# Patient Record
Sex: Female | Born: 1935 | Race: White | Hispanic: No | Marital: Single | Smoking: Former smoker
Health system: Southern US, Community
[De-identification: ages and names within clinical notes are randomized; demographics above are authoritative.]

## PROBLEM LIST (undated history)

## (undated) DIAGNOSIS — N183 Chronic kidney disease, stage 3 unspecified: Secondary | ICD-10-CM

## (undated) DIAGNOSIS — I739 Peripheral vascular disease, unspecified: Secondary | ICD-10-CM

## (undated) DIAGNOSIS — I2510 Atherosclerotic heart disease of native coronary artery without angina pectoris: Secondary | ICD-10-CM

## (undated) DIAGNOSIS — I10 Essential (primary) hypertension: Secondary | ICD-10-CM

## (undated) DIAGNOSIS — E119 Type 2 diabetes mellitus without complications: Secondary | ICD-10-CM

## (undated) DIAGNOSIS — I779 Disorder of arteries and arterioles, unspecified: Secondary | ICD-10-CM

## (undated) DIAGNOSIS — E782 Mixed hyperlipidemia: Secondary | ICD-10-CM

## (undated) DIAGNOSIS — M1990 Unspecified osteoarthritis, unspecified site: Secondary | ICD-10-CM

## (undated) HISTORY — DX: Peripheral vascular disease, unspecified: I73.9

## (undated) HISTORY — DX: Essential (primary) hypertension: I10

## (undated) HISTORY — DX: Chronic kidney disease, stage 3 unspecified: N18.30

## (undated) HISTORY — DX: Unspecified osteoarthritis, unspecified site: M19.90

## (undated) HISTORY — PX: CHOLECYSTECTOMY: SHX55

## (undated) HISTORY — PX: MASTECTOMY: SHX3

## (undated) HISTORY — PX: TONSILLECTOMY: SUR1361

## (undated) HISTORY — PX: PARTIAL HYSTERECTOMY: SHX80

## (undated) HISTORY — DX: Disorder of arteries and arterioles, unspecified: I77.9

## (undated) HISTORY — DX: Mixed hyperlipidemia: E78.2

## (undated) HISTORY — DX: Chronic kidney disease, stage 3 (moderate): N18.3

## (undated) HISTORY — PX: CATARACT EXTRACTION: SUR2

## (undated) HISTORY — DX: Atherosclerotic heart disease of native coronary artery without angina pectoris: I25.10

## (undated) HISTORY — PX: OTHER SURGICAL HISTORY: SHX169

## (undated) HISTORY — DX: Type 2 diabetes mellitus without complications: E11.9

---

## 2000-09-16 ENCOUNTER — Encounter: Payer: Self-pay | Admitting: Internal Medicine

## 2000-09-16 ENCOUNTER — Ambulatory Visit (HOSPITAL_COMMUNITY): Admission: RE | Admit: 2000-09-16 | Discharge: 2000-09-16 | Payer: Self-pay | Admitting: Internal Medicine

## 2001-09-29 ENCOUNTER — Encounter: Payer: Self-pay | Admitting: Internal Medicine

## 2001-09-29 ENCOUNTER — Ambulatory Visit (HOSPITAL_COMMUNITY): Admission: RE | Admit: 2001-09-29 | Discharge: 2001-09-29 | Payer: Self-pay | Admitting: Internal Medicine

## 2001-10-06 ENCOUNTER — Encounter: Payer: Self-pay | Admitting: Internal Medicine

## 2001-10-06 ENCOUNTER — Ambulatory Visit (HOSPITAL_COMMUNITY): Admission: RE | Admit: 2001-10-06 | Discharge: 2001-10-06 | Payer: Self-pay | Admitting: Internal Medicine

## 2001-10-30 ENCOUNTER — Ambulatory Visit (HOSPITAL_COMMUNITY): Admission: RE | Admit: 2001-10-30 | Discharge: 2001-10-30 | Payer: Self-pay | Admitting: Internal Medicine

## 2001-11-09 ENCOUNTER — Ambulatory Visit (HOSPITAL_COMMUNITY): Admission: RE | Admit: 2001-11-09 | Discharge: 2001-11-10 | Payer: Self-pay | Admitting: Pediatrics

## 2001-11-09 ENCOUNTER — Encounter: Payer: Self-pay | Admitting: *Deleted

## 2002-03-15 HISTORY — PX: CAROTID ENDARTERECTOMY: SUR193

## 2002-05-25 ENCOUNTER — Ambulatory Visit (HOSPITAL_COMMUNITY): Admission: RE | Admit: 2002-05-25 | Discharge: 2002-05-26 | Payer: Self-pay | Admitting: Cardiology

## 2002-09-03 ENCOUNTER — Encounter: Payer: Self-pay | Admitting: Internal Medicine

## 2002-09-03 ENCOUNTER — Ambulatory Visit (HOSPITAL_COMMUNITY): Admission: RE | Admit: 2002-09-03 | Discharge: 2002-09-03 | Payer: Self-pay | Admitting: Internal Medicine

## 2002-10-24 ENCOUNTER — Encounter (INDEPENDENT_AMBULATORY_CARE_PROVIDER_SITE_OTHER): Payer: Self-pay | Admitting: *Deleted

## 2002-10-24 ENCOUNTER — Inpatient Hospital Stay (HOSPITAL_COMMUNITY): Admission: RE | Admit: 2002-10-24 | Discharge: 2002-10-25 | Payer: Self-pay

## 2003-10-22 ENCOUNTER — Ambulatory Visit (HOSPITAL_COMMUNITY): Admission: RE | Admit: 2003-10-22 | Discharge: 2003-10-22 | Payer: Self-pay | Admitting: Ophthalmology

## 2003-11-26 ENCOUNTER — Ambulatory Visit (HOSPITAL_COMMUNITY): Admission: RE | Admit: 2003-11-26 | Discharge: 2003-11-26 | Payer: Self-pay | Admitting: Ophthalmology

## 2004-07-29 ENCOUNTER — Ambulatory Visit (HOSPITAL_COMMUNITY): Admission: RE | Admit: 2004-07-29 | Discharge: 2004-07-29 | Payer: Self-pay | Admitting: Internal Medicine

## 2005-08-20 ENCOUNTER — Ambulatory Visit (HOSPITAL_COMMUNITY): Admission: RE | Admit: 2005-08-20 | Discharge: 2005-08-20 | Payer: Self-pay | Admitting: Internal Medicine

## 2006-09-05 ENCOUNTER — Ambulatory Visit (HOSPITAL_COMMUNITY): Admission: RE | Admit: 2006-09-05 | Discharge: 2006-09-05 | Payer: Self-pay | Admitting: Internal Medicine

## 2007-09-06 ENCOUNTER — Ambulatory Visit (HOSPITAL_COMMUNITY): Admission: RE | Admit: 2007-09-06 | Discharge: 2007-09-06 | Payer: Self-pay | Admitting: Internal Medicine

## 2007-09-25 ENCOUNTER — Ambulatory Visit (HOSPITAL_COMMUNITY): Admission: RE | Admit: 2007-09-25 | Discharge: 2007-09-25 | Payer: Self-pay | Admitting: Internal Medicine

## 2008-10-07 ENCOUNTER — Ambulatory Visit (HOSPITAL_COMMUNITY): Admission: RE | Admit: 2008-10-07 | Discharge: 2008-10-07 | Payer: Self-pay | Admitting: Internal Medicine

## 2009-05-09 ENCOUNTER — Ambulatory Visit (HOSPITAL_COMMUNITY): Admission: RE | Admit: 2009-05-09 | Discharge: 2009-05-09 | Payer: Self-pay | Admitting: Internal Medicine

## 2009-08-20 ENCOUNTER — Ambulatory Visit (HOSPITAL_COMMUNITY): Admission: RE | Admit: 2009-08-20 | Discharge: 2009-08-20 | Payer: Self-pay | Admitting: Internal Medicine

## 2009-10-30 ENCOUNTER — Ambulatory Visit (HOSPITAL_COMMUNITY): Admission: RE | Admit: 2009-10-30 | Discharge: 2009-10-30 | Payer: Self-pay | Admitting: Internal Medicine

## 2010-04-06 ENCOUNTER — Encounter: Payer: Self-pay | Admitting: Internal Medicine

## 2010-07-31 NOTE — Discharge Summary (Signed)
NAME:  Marie House, Marie House                           ACCOUNT NO.:  [id number]   MEDICAL RECORD NO.:  [id number]                   PATIENT TYPE:  OIB   LOCATION:  [id number]                                 FACILITY:  [facility]   PHYSICIAN:  Roque Lias, M.D.               DATE OF BIRTH:  Jun 29, 1934   DATE OF ADMISSION:  11/09/2001  DATE OF DISCHARGE:  11/10/2001                                 DISCHARGE SUMMARY   ADMISSION DIAGNOSES:  1. Unstable angina.  2. Non-Insulin dependent diabetes mellitus.  3. Hypertension.  4. Hyperlipidemia.  5. Remote tobacco use.  6. History of breast cancer status-post left mastectomy two years ago.  7. Gastroesophageal reflux disease.  8. Positive family history of coronary artery disease.  9. Status-post nucleus stress test in 2001, showing septal inferior apical     ischemia.   DISCHARGE DIAGNOSES:  1. Unstable angina.  2. Non-Insulin dependent diabetes mellitus.  3. Hypertension.  4. Hyperlipidemia.  5. Remote tobacco use.  6. History of breast cancer status-post left mastectomy two years ago.  7. Gastroesophageal reflux disease.  8. Positive family history of coronary artery disease.  9. Status-post nucleus stress test in 2001 showing septal inferior apical     ischemia.  10.      Cardiac catheterization on 11/09/2001 with Dr. Adrian Prows.  11.      Patient was found to have a tandem 80%, 95% stenoses in the mid     left anterior descending.  He performed percutaneous transluminal     coronary angioplasty and stent with a Cypher stent with 0 residual     stenosis.  As well there is 10% to 20% right coronary artery stenosis.     Ejection fraction 60%.   HISTORY OF PRESENT ILLNESS:  The patient is a pleasant, 76 year old,  married, white female who has a past history of non-Insulin dependent  diabetes mellitus, hypertension, hyperlipidemia, who was referred to Dr.  Mathis Bud on 11/08/2001, for evaluation of chest pain.  She reported a two  week history  of mid substernal chest discomfort, which was associated with  shortness of breath, but no nausea or vomiting.  It was radiating to both  upper extremities.  One episode of chest discomfort was associated with  diaphoresis as well.  All episodes have been associated with extreme  weakness.  These chest pains were now occurring approximately 2 to 3 times a  day and were not necessarily exertional.  She had a previous stress test in  2001, which showed septal inferior apical ischemia.  At that time EF was  64%.  Cardiac risk factors were positive for hypertension, non-Insulin  dependent diabetes mellitus, hyperlipidemia, and positive family history for  CAD.  She had history of remote minimal tobacco use.  She denied  palpitations, dizziness, PND, orthopnea, or lower extremity edema.   On exam at that time, she was hemodynamically  stable with blood pressure  124/80, heart rate 72.  No significant abnormalities on physical  examination.  EKG showed sinus rhythm, nonspecific ST-T change.  At that  time, she was seen and evaluated by Odette Fraction in the office.  It was  felt that she needed to undergo definitive cardiac catheterization.  Risks  and benefits of the procedure were discussed in detail with the patient and  she was willing to proceed.  This was scheduled in for the following day  with Dr. Adrian Prows.  She was found to have tandem 80%, 95% stenoses in the  mid LAD.  He proceeded with PTCA and stent using a Cypher stent with 0  residual stenosis.  She tolerated the procedure well and had no  complications.  We plan to continue beta blocker, add ACE inhibitor.  Continue Aspirin and add Plavix 75 mg for three months.   On the morning of 11/10/2001, the patient is doing very well.  She is having  no chest pain and has no complaints.  Temperature is 98.1, pulse 75, blood  pressure 108/50, oxygen saturation rate 97% on room air.  Labs are stable  post-procedure.  She has maintained sinus  rhythm with no arrhythmia.  Right  groin site stable with no hematoma bleed.  At this time she is seen and  evaluated by Dr. Terance Ice who deems her stable for discharge home.   HOSPITAL CONSULTS:  None.   HOSPITAL PROCEDURES:  Cardiac catheterization on 11/09/2001 by Dr. Adrian Prows.  Please see his dictation, as it is not available to me.  Left main  normal.  Left circumflex and it's branches are normal.  LAD has mid 80%, 95%  tandem stenoses.  She has a small diagonal one which is normal.  Right  coronary has 10 % to 20% stenosis.  He then proceeded with PTCA stent using  a Cypher stent to the mid LAD.  This 80% and 95% tandem stenosis went to 0%  residual.  EF 60%.  She tolerated the procedure well and had no  complications.  She had normal renal arteries.   LABORATORIES POST-PROCEDURE:  Sodium 134, potassium 4.3, BUN 14, creatinine  0.7, glucose 234, which was treated.  White count 9.1, hemoglobin 10.9,  hematocrit 32.0, platelets 270.   Telemetry shows normal sinus rhythm.  No arrhythmias.   DISCHARGE MEDICATIONS:  1. Glucophage:  Do not take this on Friday or Saturday.  You can resume it     on Sunday at your previous dose.  2. Plavix 75 mg once a day for three months.  3. Enteric-coated Aspirin 325 mg once a day.  4. Amaryl 4 mg two tablets each day.  5. Metoprolol 50 mg twice a day.  6. Prevacid 30 mg once a day.  7. Vitamin E as before.  8. Xanax as needed.  9. Zetia 10 mg a day.  10.      Altace 5 mg once a day.  11.      Nitroglycerin 0.4 mg sublingual as directed for chest pain.  12.      Zocor 20 mg one each night.   DISCHARGE INSTRUCTIONS:  1. No strenuous activity, lifting more than 5 pounds, driving, or sexual     activity for three days.  2. Low salt, low fat, low cholesterol diabetic diet.  3.     May gently wash your wounds out with warm water and soap. 4. Call [phone] if any bleeding or any  signs of pain at wound site.  5. She has an appointment  to see Dr. Mathis Bud in the regional office     11/21/2001 at 10:20 a.m.         Mary B. Easley, P.A.-C.                   Roque Lias, M.D.    MBE/MEDQ  D:  11/10/2001  T:  11/13/2001  Job:  [id number]   cc:   Karma Greaser, M.D.

## 2010-07-31 NOTE — Cardiovascular Report (Signed)
NAME:  LATONDRA, MAGEL                           ACCOUNT NO.:  [id number]   MEDICAL RECORD NO.:  [id number]                   PATIENT TYPE:  OIB   LOCATION:  6525                                 FACILITY:  [facility]   PHYSICIAN:  Eden Lathe. Einar Gip, M.D.                  DATE OF BIRTH:  1934/10/19   DATE OF PROCEDURE:  11/09/2001  DATE OF DISCHARGE:  11/10/2001                              CARDIAC CATHETERIZATION   PROCEDURE PERFORMED:  1. Left ventriculography.  2. Selective right and left coronary arteriography.  3. Selective right and left renal arteriography.  4. Percutaneous transluminal coronary angioplasty and stenting of the mid     left anterior descending artery.   INDICATION:  The patient is a 76 year old female with a history of  hypertension, diabetes mellitus, hypercholesterolemia, who has been  complaining of chest pain very suggestive of angina pectoris class III.  Given this, she was brought to the cardiac catheterization laboratory to  evaluate her coronary anatomy. She also has multiple cardiac risk factors.  Renal arteriography was performed to evaluate for renal atherosclerotic  disease.   HEMODYNAMIC DATA:  The left ventricular pressures 151 with an end-diastolic  pressure of 16 mmHg. The aortic pressures were 150/80 with a mean of 105  mmHg.  There was no pressure gradient across the aortic valve.   ANGIOGRAPHIC DATA:   Right coronary artery:  The right coronary artery is a dominant vessel. It  is a small to moderate caliber vessel. It has mild disease in the proximal  segment constituting 10-20% stenosis.   Left main coronary artery:  The left main coronary artery is a large caliber  vessel. It is normal.   Circumflex coronary artery:  The circumflex coronary artery is a moderate  large caliber vessel. It gives origin to a very large obtuse marginal #1 and  continues as a obtuse marginal #2. It gives a small AV groove branch.  It  has mild disease.   Left  anterior descending artery:  The left anterior descending artery is a  large caliber vessel. The mid segment of the left anterior descending artery  has tandem 80% and a 95% stenosis.  The LAD wraps around the apex.   SELECTIVE RENAL ARTERIOGRAPHY:  Right and left renal arteriography revealed  normal renal arteries.   INTERVENTIONAL DATA:  Successful PTCA and stenting of the mid left anterior  descending artery with a 3.0 x 23 mm Cypher stent deployed at 16 atmospheres  of pressure. The stent was post-dilated with a 3.25 x 20 mm Quantum balloon  at 12 atmospheres of pressure given the 3.25 mm lumen. The overall stenosed  reduction was from 95% to 0% with TIMI-3 to TIMI-3 flow maintained. There  was no evidence of dissection, no evidence of thrombosis at the end of the  procedure.   RECOMMENDATIONS:  Continued risk factor modification is indicated. The  patient will be continued on Plavix for at least three months along with  aspirin.   TECHNIQUE OF PROCEDURE:  Under the usual sterile precautions, using a 6  French right femoral artery access, 6 [place] multipurpose B2 catheter was  advanced into the ascending aorta over a 0.035 inch J wire. The catheter was  gently advanced in the left ventricle and left ventricular pressures were  monitored. Hand contrast injection of the left ventricular was performed,  both in LAO and RAO projection. Then the catheter was flushed with saline  and pulled back into the ascending aorta and pressure gradient across  gradient across the aortic valve was monitored.  The left main coronary  artery was selectively engaged and angiography was performed. The right  coronary artery was selectively engaged and angiography was performed.  Then  the catheter was pulled back into the abdominal aorta and selective right  and left renal arteriography was performed.   TECHNIQUE OF INTERVENTION:  An 6 French sheath was exchanged for a 7 [place]  sheath.  Then a 7  [place] FL 3.5 guide was advanced into the ascending aorta  over the 0.035 inch J wire. The left main coronary artery was selectively  engaged and angiography was performed.  Then 180 cm x 0.014 inch ATW guide  wire was advanced into the left anterior descending artery and the lesion  length was measured with the help of the markers. Then a 2.0 x 20 mm  Maverick balloon was advanced into the left anterior descending artery and  after confirming the pressure of the balloon, balloon inflation was  performed at 14 atmospheres of pressure for 90 seconds. The balloon was  deflated, pulled back in the guiding catheter and angiography was performed.  There was a type A dissection noted in the balloon angioplastied site.  Again, the balloon was exchanged, pulled out of the body, Integrilin and  nitroglycerin was administered and angiography was performed. Because of the  persistence of the dissection, a 3.0 x 23 mm Cypher stent was advanced into  the left anterior descending artery and the stent was deployed at 16  atmospheres of pressure for 73 seconds.  Then the balloon was deflated,  pulled back in the guiding catheter, Integrilin and nitroglycerin was  administered and angiography was performed. The stent was post-dilated with  a 3.25 x 20 Quantum Maverick balloon at a maximum of 12 atmospheres of  pressure giving a 3.25 mm lumen.  Then the balloon was deflated, pulled back  in the catheter, Integrilin and nitroglycerin was administered and  angiography was performed. Excellent results were noted. Then the balloon  and the guide wire was pulled out of the coronary arteries and the guide  ___________ from the left main and pulled out of the body in the usual  fashion and arterial axis was sutured in place and patient was transferred  to recovery in a stable condition. During the procedure, intravenous Angiomax was administered and the ACT was maintained at therapeutic range.                                                     Eden Lathe. Einar Gip, M.D.    Minna Antis  D:  11/09/2001  T:  11/11/2001  Job:  [id number]   cc:   Sherrilee Gilles. Gerarda Fraction, M.D.  [address]  [place] [address]  Fax: [phone]   Roque Lias, M.D.

## 2010-07-31 NOTE — Cardiovascular Report (Signed)
NAME:  Marie House, Marie House                           ACCOUNT NO.:  [id number]   MEDICAL RECORD NO.:  [id number]                   PATIENT TYPE:  OIB   LOCATION:  2923                                 FACILITY:  [facility]   PHYSICIAN:  Eden Lathe. Einar Gip, M.D.                  DATE OF BIRTH:  [birth year]   DATE OF PROCEDURE:  05/25/2002  DATE OF DISCHARGE:  05/26/2002                              CARDIAC CATHETERIZATION   PROCEDURE PERFORMED:  1. Percutaneous transluminal coronary angioplasty and stenting of the left     anterior descending artery.  2. Intravascular intervention of the left anterior descending artery.  3. Intracoronary nitroglycerin administration.  4. Adjuvant Integrelin IV heparinization for anticoagulation.   CARDIOLOGIST:  Eden Lathe. Einar Gip, M.D.   INDICATIONS FOR PROCEDURE:  The patient is a 76 year old female with a  history of hypertension, diabetes, hyperlipidemia, who has a history of  noncompliance to her diet.  She has a history of dietary indiscretion.  She  was evaluated by Dr. Odette Fraction for recurrent angina pectoris.  She has  had a PTCA with Cypher stent implantation in August 2003 with a 3.0 x 23 mm  Cypher stent.  After obtaining informed consent, the patient was brought to  the cardiac catheterization lab to evaluate the coronary anatomy.  She  underwent a diagnostic cardiac catheterization at the [facility] earlier  this morning and because of very high grade stenosis in the proximal LAD and  ongoing chest pain, she was directly transferred to [facility]  cardiac catheterization lab for PCI of the left anterior descending coronary  artery.   EVALUATION DATA:  1. PTCA and stenting of the proximal left anterior descending artery with a     3.0 x 20 mm TAXUS drug-eluding stent deployed at 14 atmospheres of     pressure.  The stent between the struts was again dilated with the stent     balloon at 14 atmospheres of pressure.  Overall, the stenosis  reduced     from 99% to 0% with TIMI-2 to TIMI-3 flow maintained at the end of the     procedure.  No evidence of dissection or thrombus at the end of the     procedure.  2. IVUS confirmation of excellent opposition to the arterial wall.   RECOMMENDATIONS:  Risk factor modification is indicated.  I had an extensive  discussion with the patient regarding her dietary indiscretion and  compliance with her medical therapy.   DESCRIPTION OF PROCEDURE:  The 5 French right femoral artery access sheath  was exchanged for a 7 French sheath in an aseptic manner in a sterile  fashion.  Then a 7 [place] FL4 guide was advanced to the ascending aorta with  a 0.025 J-wire.  The left coronary artery was selectively engaged and  angiography was performed.  Then an 300 cm x 0.014 inch ATW  marker wire was  utilized to graft the left anterior descending stenosis.  The lesion was  then predilated with a 3.0 x 15 mm Maverick balloon at 10 atmospheres of  pressure.  The balloon was deflated, pulled back through the guiding  catheter and arteriography was performed.  There was type B dissection noted  in the entry of the previously placed stent.  IVUS was then performed after  withdrawing the balloon out.  The  IVUS catheter was used for IVUS  interrogation.  After performing the vein irrigation, the lesion length and  the __________ lumen diameter was measured.  Also, the previously placed  excellent opposition was confirmed.  Then the IVUS catheter was withdrawn  out of the body and a 2.0 x 20 TAXUS stent was advanced over the same  guidewire and after confirming the position of the placement of the stent,  the stent was deployed at 14 atmospheres pressure.  This was done for 60  seconds, and the balloon was deflated, pushed into the previously placed  Cypher stent and a second inflation at 14 atmospheres pressure for 20  seconds was performed.  The balloon was deflated and pulled back in the  guiding  catheter.  Intracoronary nitroglycerin was administered and  angiography was performed.  Excellent results were noted.  After confirming  the success, the guidewire was withdrawn.  Angiography was performed.  The  guide catheter was distended and pulled out of the body in the usual  fashion.  The arterial access sheath was sutured in place, and the patient  was transferred to recovery in stable condition.  The patient tolerated the  procedure well.                                               Eden Lathe. Einar Gip, M.D.    Minna Antis  D:  05/25/2002  T:  05/26/2002  Job:  [id number]   cc:   Sherrilee Gilles. Gerarda Fraction, M.D.  [address] [place] [address]  Fax: [place]

## 2010-07-31 NOTE — Op Note (Signed)
NAME:  Marie House, Marie House                           ACCOUNT NO.:  [id number]   MEDICAL RECORD NO.:  [id number]                   PATIENT TYPE:  AMB   LOCATION:  DAY                                  FACILITY:  [facility]   PHYSICIAN:  Cristopher Estimable. Rourk, M.D.               DATE OF BIRTH:  1934-06-30   DATE OF PROCEDURE:  DATE OF DISCHARGE:                                 OPERATIVE REPORT   PROCEDURE:  Diagnostic esophagogastroduodenoscopy followed by diagnostic  colonoscopy.   INDICATIONS FOR PROCEDURE:  The patient is a 76 year old lady with right-  sided chest and back pain.  She was found to be Hemoccult positive in my  office.  An esophagogastroduodenoscopy and colonoscopy are now being done to  further evaluate symptoms.  The procedure has been discussed with the  patient previously, and again today at the bedside.  Potential risks,  benefits, and alternatives have been reviewed and questions and answered.  She is agreeable with this.  Conscious sedation will be Versed and Demerol.   PROCEDURE NOTE:  The O2 saturation, blood pressure, pulse, and respirations  were monitored throughout the entire procedure.  Conscious sedation:  Versed  4 mg IV, Demerol 75 mg IV, in divided doses.  Instrument:  Olympus  videogastroscope and colonoscope.   EGD FINDINGS:  Examination of the tubular esophagus revealed a noncritical  Schatzki's ring.  Otherwise, esophageal mucosa appeared normal.  The EG  junction was easily traversed easily into the stomach.  The gastric cavity  was entered and insufflated well with air.  Thorough examination of the  gastric mucosa including retroflexed view of the proximal stomach and  esophagogastric junction demonstrated only a small hiatal hernia and some  erosions.  The gastric mucosa otherwise appeared normal.  Pylorus was patent  and easily traversed.  Examination of the duodenum, bulb, and second portion  appeared normal.   THERAPEUTIC/DIAGNOSTIC MANEUVERS PERFORMED:   None.   The patient tolerated the procedure well and was prepared for colonoscopy.  Digital rectal examination revealed no abnormalities.  Endoscopic findings:  The prep was good.  Rectum:  Examination of the rectal mucosa including  retroflexed view at the anal verge revealed no abnormalities except for  internal hemorrhoids.  The colonic mucosa was surveyed from the rectosigmoid  junction through the left transverse and right colon to the area of the  appendiceal orifice, ileocecal valve, and cecum.  These structures were well  seen and photographed for the record.  The scope was slowly withdrawn was  slowly withdrawn.  All mucosal surfaces were again seen.  No other  abnormalities aside from scattered left-sided diverticula were seen.  The  patient tolerated the procedure well and was reacted in endoscopy.   IMPRESSION:  1. Noncritical  Schatzki's ring, not needing inflated.  The patient was not     having dysphagia.  2. Linear antral erosions.  3. The remainder  of the gastric mucosa and duodenum through the second     portion appeared normal.   COLONOSCOPY FINDINGS:  1. Internal hemorrhoids, otherwise normal rectum  2. Left-sided diverticula, and the colonic mucosa appeared normal.   RECOMMENDATIONS:  Lab work is pending through my office.  She will need to  be checked for Helicobacter pylori.  Would consider a trial of nonsteroidal  anti-inflammatory drug therapy for musculoskeletal pain.  Further  recommendations to follow.                                                 Cristopher Estimable. Rourk, M.D.    RMR/MEDQ  D:  10/30/2001  T:  10/31/2001  Job:  [id number]   cc:   Virgel Paling, M.D.  [address], [place] [address]  Fax: [phone]

## 2010-07-31 NOTE — Op Note (Signed)
NAME:  Marie House, Marie House                           ACCOUNT NO.:  [id number]   MEDICAL RECORD NO.:  [id number]                   PATIENT TYPE:  INP   LOCATION:  2550                                 FACILITY:  [facility]   PHYSICIAN:  Larrie Kass., M.D.            DATE OF BIRTH:  02-05-1935   DATE OF PROCEDURE:  10/24/2002  DATE OF DISCHARGE:                                 OPERATIVE REPORT   PREOPERATIVE DIAGNOSIS:  Right carotid stenosis.   POSTOPERATIVE DIAGNOSIS:  Right carotid stenosis.   OPERATION PERFORMED:  Right carotid endarterectomy with patch angioplasty.   SURGEON:  Georgina Quint, M.D.   ASSISTANT:  Darrelyn Hillock, M.D.   ANESTHESIA:  General.   DESCRIPTION OF PROCEDURE:  Before coming into the operating room the patient  had arterial line placed.  After adequate monitoring and induction of  anesthesia and routine preparation and draping of the right side of the  neck, I made an incision parallel to the anterior border of the  sternocleidomastoid muscle in the mid neck about 6 cm long and stopping well  short of the angle of the jaw.  I dissected down through the subcutaneous  tissues and noted the tail of the parotid gland and avoided that and then  dissected on down toward the carotid pulse going through platysma and the  deep cervical fascia.  I noted the internal jugular vein and a large vein  entering it from anterior but lower down than usual for the common facial  vein and I tried not to injure it.  As I dissected, I did make a sizable  hole in the side of that vein near the entry into the jugular and I sewed  that closed with running 4-0 Prolene suture.  I then divided a couple of  smaller veins and had good access to the common carotid.  I dissected up to  the carotid bifurcation.  The hypoglossal nerve lay lower than usual and I  had to dissect it up for some distance in order to free it up and get  adequate exposure of the carotid bifurcation.   There was a typical disease  at the carotid bifurcation and in the proximal internal carotid and it  softened up nicely as I dissected distally on the internal carotid artery.  I controlled the common carotid, the internal carotid, the external carotid,  and superior thyroid arteries with loops.  After the patient was  heparinized, I tightened the loops and placed the clamp on the common  carotid, then made a longitudinal arteriotomy beginning on the common  carotid and going up into the internal carotid past the plaque.  The plaque  was partially degenerated and had a web-like stenosis in the proximal part.  It seemed to thin out nicely as it went on up into the internal carotid.  I  placed a 10 [place] straight shunt between the common  carotid artery and the  internal carotid artery, restored arterial flow to the brain quickly.  I  then proceeded leisurely to perform an endarterectomy beginning in the  common carotid and working up into the internal and then intussuscepting  plaque out of the external carotid.  I then got good back bleeding from the  external carotid and filled it with heparinized saline.  There was also good  back bleeding from the internal carotid.  I very carefully divided the  plaque transversely as it thinned into normal intima going distally into the  internal carotid.  I vigorously irrigated it with saline with heparin and  saw no tendency for any dissection.  I then closed the arteriotomy after  removing small areas of loose smooth muscle and plaque, then irrigating it  thoroughly.  I closed it with a Hemashield Finesse patch sewn in with 6-0  Prolene.  A few flutes were present requiring  additional sutures and then  hemostasis was good.  There was a very slight tendency towards oozing  attributable to patient being on anticoagulant drugs.  I then reversed  heparin  with 75 mg of protamine.  I left a Penrose drain at the lower end of the  incision and closed the  neck wound with two layers of running 3-0 Vicryl and  closed the skin with staples after getting good hemostasis in the  subcutaneous tissues.  The patient was stable through the operation.                                                Larrie Kass., M.D.    WB/MEDQ  D:  10/24/2002  T:  10/24/2002  Job:  [id number]   cc:   Quay Burow, M.D.  [address] [place] [address]  Fax: [place] Gerarda Fraction, M.D.  [address] [place] [address]  Fax: [phone]

## 2010-11-12 ENCOUNTER — Other Ambulatory Visit (HOSPITAL_COMMUNITY): Payer: Self-pay | Admitting: Internal Medicine

## 2010-11-12 DIAGNOSIS — Z139 Encounter for screening, unspecified: Secondary | ICD-10-CM

## 2010-11-19 ENCOUNTER — Ambulatory Visit (HOSPITAL_COMMUNITY)
Admission: RE | Admit: 2010-11-19 | Discharge: 2010-11-19 | Disposition: A | Discharge status: Home / Self Care | Payer: Medicare HMO | Source: Ambulatory Visit | Attending: Internal Medicine | Admitting: Internal Medicine

## 2010-11-19 DIAGNOSIS — Z139 Encounter for screening, unspecified: Secondary | ICD-10-CM

## 2010-11-19 DIAGNOSIS — Z1231 Encounter for screening mammogram for malignant neoplasm of breast: Secondary | ICD-10-CM | POA: Insufficient documentation

## 2012-12-28 ENCOUNTER — Other Ambulatory Visit (HOSPITAL_COMMUNITY): Payer: Self-pay | Admitting: Internal Medicine

## 2012-12-28 DIAGNOSIS — Z139 Encounter for screening, unspecified: Secondary | ICD-10-CM

## 2013-01-04 ENCOUNTER — Ambulatory Visit (HOSPITAL_COMMUNITY): Payer: Medicare HMO

## 2013-06-06 ENCOUNTER — Encounter: Payer: Self-pay | Admitting: *Deleted

## 2013-06-06 DIAGNOSIS — N183 Chronic kidney disease, stage 3 unspecified: Secondary | ICD-10-CM | POA: Insufficient documentation

## 2013-06-06 DIAGNOSIS — E782 Mixed hyperlipidemia: Secondary | ICD-10-CM | POA: Insufficient documentation

## 2013-06-06 DIAGNOSIS — G4700 Insomnia, unspecified: Secondary | ICD-10-CM | POA: Insufficient documentation

## 2013-06-06 DIAGNOSIS — I10 Essential (primary) hypertension: Secondary | ICD-10-CM | POA: Insufficient documentation

## 2013-06-06 DIAGNOSIS — E785 Hyperlipidemia, unspecified: Secondary | ICD-10-CM

## 2013-06-06 DIAGNOSIS — E119 Type 2 diabetes mellitus without complications: Secondary | ICD-10-CM

## 2013-06-06 DIAGNOSIS — N181 Chronic kidney disease, stage 1: Secondary | ICD-10-CM

## 2013-06-07 ENCOUNTER — Encounter: Payer: Self-pay | Admitting: Cardiology

## 2013-06-07 ENCOUNTER — Ambulatory Visit (INDEPENDENT_AMBULATORY_CARE_PROVIDER_SITE_OTHER): Payer: Medicare HMO | Admitting: Cardiology

## 2013-06-07 VITALS — BP 149/59 | HR 71 | Ht 65.0 in | Wt 207.0 lb

## 2013-06-07 DIAGNOSIS — N183 Chronic kidney disease, stage 3 unspecified: Secondary | ICD-10-CM

## 2013-06-07 DIAGNOSIS — I6529 Occlusion and stenosis of unspecified carotid artery: Secondary | ICD-10-CM | POA: Insufficient documentation

## 2013-06-07 DIAGNOSIS — E119 Type 2 diabetes mellitus without complications: Secondary | ICD-10-CM

## 2013-06-07 DIAGNOSIS — E782 Mixed hyperlipidemia: Secondary | ICD-10-CM

## 2013-06-07 DIAGNOSIS — I10 Essential (primary) hypertension: Secondary | ICD-10-CM

## 2013-06-07 DIAGNOSIS — I2510 Atherosclerotic heart disease of native coronary artery without angina pectoris: Secondary | ICD-10-CM | POA: Insufficient documentation

## 2013-06-07 NOTE — Assessment & Plan Note (Signed)
No obvious angina symptoms but chronic shortness of breath. ECG is normal today. She is on reasonable medical regimen. No assessment of LVEF in several years. We will plan at least a baseline echocardiogram for now and clinical followup in 6 months.

## 2013-06-07 NOTE — Assessment & Plan Note (Signed)
Blood pressure elevated today. She is on good medical regimen including Coreg and Vasotec. Stressed sodium restriction, activity as tolerated. Keep follow with Dr. Willey Blade.

## 2013-06-07 NOTE — Assessment & Plan Note (Signed)
Fairly recent creatinine 1.8.

## 2013-06-07 NOTE — Assessment & Plan Note (Signed)
Reports compliance Pravachol, LDL 79.

## 2013-06-07 NOTE — Progress Notes (Signed)
Clinical Summary Marie House is a 79 y.o.female referred to establish cardiology followup by Dr. Willey Blade. She is a former patient of Dr. Gwenlyn House with [facility] last seen in January 2013. I reviewed her history. She reports no angina symptoms, has had chronic shortness of breath. States that she is functionally limited by significant bilateral knee arthritis. She used to walk a mile a day for exercise, but has not been able to do this anymore. She reports compliance with her medical therapy, no obvious problems.  Lab work from October 2014 showed potassium 5.1, BUN 20, creatinine 1.8, hemoglobin A1c 7.3. Lipid numbers from June 2014 showed cholesterol 181, triglycerides 22, HDL 62, LDL 79.  She reportedly had a Myoview study in January 2010 that showed no clear evidence of ischemia. Carotid Dopplers from February 2011 revealed minimal carotid atherosclerosis bilaterally. No followup testing since that time.  ECG today shows normal sinus rhythm.   Allergies  Allergen Reactions  . Altace [Ramipril]   . Atacand [Candesartan]   . Avandia [Rosiglitazone]   . Crestor [Rosuvastatin]   . Diovan [Valsartan]   . Lisinopril   . Lotensin [Benazepril]   . Norvasc [Amlodipine Besylate]   . Zetia [Ezetimibe]     Current Outpatient Prescriptions  Medication Sig Dispense Refill  . ALPRAZolam (XANAX) 0.5 MG tablet Take 0.5 mg by mouth 3 (three) times daily as needed for anxiety.      Marland Kitchen aspirin EC 81 MG tablet Take 81 mg by mouth daily.      . carvedilol (COREG) 12.5 MG tablet Take 12.5 mg by mouth 2 (two) times daily with a meal.       . enalapril (VASOTEC) 2.5 MG tablet Take 2.5 mg by mouth daily.      . fenofibrate (TRICOR) 145 MG tablet Take 145 mg by mouth daily.      . hydrochlorothiazide (HYDRODIURIL) 25 MG tablet Take 25 mg by mouth daily.      . insulin aspart (NOVOLOG) 100 UNIT/ML injection Inject 10 Units into the skin 3 (three) times daily before meals.      . insulin glargine (LANTUS) 100 UNIT/ML  injection Inject 80 Units into the skin daily.      Marland Kitchen omeprazole (PRILOSEC) 20 MG capsule Take 20 mg by mouth daily.      . pravastatin (PRAVACHOL) 80 MG tablet Take 80 mg by mouth daily.      . traMADol (ULTRAM) 50 MG tablet Take by mouth every 6 (six) hours as needed.       No current facility-administered medications for this visit.    Past Medical History  Diagnosis Date  . Coronary atherosclerosis of native coronary artery     Cypher DES LAD 2003, Taxus DES LAD 2004  . Carotid artery disease   . Essential hypertension, benign   . Mixed hyperlipidemia   . Type 2 diabetes mellitus   . Arthritis   . CKD (chronic kidney disease) stage 3, GFR 30-59 ml/min     Past Surgical History  Procedure Laterality Date  . Tonsillectomy    . Mastectomy Left   . Cholecystectomy    . Partial hysterectomy    . Cataract extraction    . Carotid endarterectomy Right 2004    Family History  Problem Relation Age of Onset  . CAD Mother     Social History Marie House reports that she has quit smoking. Her smoking use included Cigarettes. She smoked 0.00 packs per day. She does not have any  smokeless tobacco history on file. Marie House reports that she does not drink alcohol.  Review of Systems Negative except as outlined above.  Physical Examination Filed Vitals:   06/07/13 0856  BP: 149/59  Pulse: 71   Filed Weights   06/07/13 0856  Weight: 207 lb (93.895 kg)   Overweight woman, appears comfortable at rest. HEENT: Conjunctiva and lids normal, oropharynx clear. Neck: Supple, no elevated JVP or carotid bruits, no thyromegaly. Lungs: Clear to auscultation, nonlabored breathing at rest. Cardiac: Regular rate and rhythm, no S3, soft systolic murmur, no pericardial rub. Abdomen: Soft, nontender, bowel sounds present, no guarding or rebound. Extremities: No pitting edema, distal pulses 2+. Skin: Warm and dry. Musculoskeletal: No kyphosis. Neuropsychiatric: Alert and oriented x3, affect  grossly appropriate.   Problem List and Plan   Coronary atherosclerosis of native coronary artery No obvious angina symptoms but chronic shortness of breath. ECG is normal today. She is on reasonable medical regimen. No assessment of LVEF in several years. We will plan at least a baseline echocardiogram for now and clinical followup in 6 months.  Essential hypertension, benign Blood pressure elevated today. She is on good medical regimen including Coreg and Vasotec. Stressed sodium restriction, activity as tolerated. Keep follow with Dr. Willey Blade.  Mixed hyperlipidemia Reports compliance Pravachol, LDL 79.  Carotid artery occlusion History of right CEA in 2004. Followup carotid Dopplers to be obtained.  CKD (chronic kidney disease) stage 3, GFR 30-59 ml/min Fairly recent creatinine 1.8.  Type 2 diabetes mellitus On insulin, followed by Dr. Willey Blade.    Satira Sark, M.D., F.A.C.C.

## 2013-06-07 NOTE — Assessment & Plan Note (Signed)
On insulin, followed by Dr. Willey Blade.

## 2013-06-07 NOTE — Assessment & Plan Note (Signed)
History of right CEA in 2004. Followup carotid Dopplers to be obtained.

## 2013-06-07 NOTE — Patient Instructions (Signed)
Your physician wants you to follow-up in: 6 months You will receive a reminder letter in the mail two months in advance. If you don't receive a letter, please call our office to schedule the follow-up appointment.    Your physician recommends that you continue on your current medications as directed. Please refer to the Current Medication list given to you today.   Your physician has requested that you have an echocardiogram. Echocardiography is a painless test that uses sound waves to create images of your heart. It provides your doctor with information about the size and shape of your heart and how well your heart's chambers and valves are working. This procedure takes approximately one hour. There are no restrictions for this procedure.  Your physician has requested that you have a carotid duplex. This test is an ultrasound of the carotid arteries in your neck. It looks at blood flow through these arteries that supply the brain with blood. Allow one hour for this exam. There are no restrictions or special instructions.   Thank you for choosing [facility] !

## 2013-06-12 ENCOUNTER — Ambulatory Visit (HOSPITAL_COMMUNITY)
Admission: RE | Admit: 2013-06-12 | Discharge: 2013-06-12 | Disposition: A | Discharge status: Home / Self Care | Payer: Medicare HMO | Source: Ambulatory Visit | Attending: Cardiology | Admitting: Cardiology

## 2013-06-12 DIAGNOSIS — I6529 Occlusion and stenosis of unspecified carotid artery: Secondary | ICD-10-CM | POA: Insufficient documentation

## 2013-06-12 DIAGNOSIS — I2510 Atherosclerotic heart disease of native coronary artery without angina pectoris: Secondary | ICD-10-CM | POA: Insufficient documentation

## 2013-06-12 DIAGNOSIS — Z6834 Body mass index (BMI) 34.0-34.9, adult: Secondary | ICD-10-CM | POA: Insufficient documentation

## 2013-06-12 DIAGNOSIS — Z87891 Personal history of nicotine dependence: Secondary | ICD-10-CM | POA: Insufficient documentation

## 2013-06-12 DIAGNOSIS — I519 Heart disease, unspecified: Secondary | ICD-10-CM | POA: Diagnosis not present

## 2013-06-12 DIAGNOSIS — E785 Hyperlipidemia, unspecified: Secondary | ICD-10-CM | POA: Insufficient documentation

## 2013-06-12 DIAGNOSIS — I129 Hypertensive chronic kidney disease with stage 1 through stage 4 chronic kidney disease, or unspecified chronic kidney disease: Secondary | ICD-10-CM | POA: Insufficient documentation

## 2013-06-12 DIAGNOSIS — N183 Chronic kidney disease, stage 3 unspecified: Secondary | ICD-10-CM | POA: Insufficient documentation

## 2013-06-12 DIAGNOSIS — E119 Type 2 diabetes mellitus without complications: Secondary | ICD-10-CM | POA: Insufficient documentation

## 2013-06-12 NOTE — Progress Notes (Signed)
*  PRELIMINARY RESULTS* Echocardiogram 2D Echocardiogram has been performed.  [place], [place] 06/12/2013, 3:47 PM

## 2013-11-27 ENCOUNTER — Encounter: Payer: Self-pay | Admitting: Cardiology

## 2013-11-27 ENCOUNTER — Ambulatory Visit (INDEPENDENT_AMBULATORY_CARE_PROVIDER_SITE_OTHER): Payer: Commercial Managed Care - HMO | Admitting: Cardiology

## 2013-11-27 VITALS — BP 128/76 | HR 89 | Ht 65.0 in | Wt 206.0 lb

## 2013-11-27 DIAGNOSIS — I10 Essential (primary) hypertension: Secondary | ICD-10-CM | POA: Diagnosis not present

## 2013-11-27 DIAGNOSIS — I658 Occlusion and stenosis of other precerebral arteries: Secondary | ICD-10-CM

## 2013-11-27 DIAGNOSIS — I6529 Occlusion and stenosis of unspecified carotid artery: Secondary | ICD-10-CM | POA: Diagnosis not present

## 2013-11-27 DIAGNOSIS — E782 Mixed hyperlipidemia: Secondary | ICD-10-CM | POA: Diagnosis not present

## 2013-11-27 DIAGNOSIS — I6523 Occlusion and stenosis of bilateral carotid arteries: Secondary | ICD-10-CM

## 2013-11-27 DIAGNOSIS — I2510 Atherosclerotic heart disease of native coronary artery without angina pectoris: Secondary | ICD-10-CM | POA: Diagnosis not present

## 2013-11-27 NOTE — Assessment & Plan Note (Signed)
Continue Pravachol, keep followup Dr. Willey Blade for regular lab work.

## 2013-11-27 NOTE — Progress Notes (Signed)
Clinical Summary Marie House is a 79 y.o.female last seen in March. She tells me that her husband died last month, she is still going through the grieving process. She does have children that still live in town. No change in cardiac status, she is not reporting any angina, states that she has been taking her medicines regularly.  Echocardiogram from March of this year reported LVEF 65-70% with mild LVOT gradient, grade 1 diastolic dysfunction, mildly sclerotic aortic valve, mitral annular calcification. Carotid Dopplers in March of this year reported less than 50% bilateral ICA stenoses.  She reportedly had a Myoview study in January 2010 that showed no clear evidence of ischemia. We continue to follow her on medical therapy, no change in symptoms.   Allergies  Allergen Reactions  . Altace [Ramipril]   . Atacand [Candesartan]   . Avandia [Rosiglitazone]   . Crestor [Rosuvastatin]   . Diovan [Valsartan]   . Lisinopril   . Lotensin [Benazepril]   . Norvasc [Amlodipine Besylate]   . Zetia [Ezetimibe]     Current Outpatient Prescriptions  Medication Sig Dispense Refill  . ALPRAZolam (XANAX) 0.5 MG tablet Take 0.5 mg by mouth 3 (three) times daily as needed for anxiety.      Marland Kitchen aspirin EC 81 MG tablet Take 81 mg by mouth daily.      . carvedilol (COREG) 12.5 MG tablet Take 12.5 mg by mouth 2 (two) times daily with a meal.       . enalapril (VASOTEC) 5 MG tablet Take 5 mg by mouth daily.      . fenofibrate (TRICOR) 145 MG tablet Take 145 mg by mouth daily.      . hydrochlorothiazide (HYDRODIURIL) 25 MG tablet Take 25 mg by mouth daily.      . insulin aspart (NOVOLOG) 100 UNIT/ML injection Inject 10 Units into the skin 3 (three) times daily before meals.      . insulin glargine (LANTUS) 100 UNIT/ML injection Inject 80 Units into the skin daily.      Marland Kitchen omeprazole (PRILOSEC) 20 MG capsule Take 20 mg by mouth daily.      . pravastatin (PRAVACHOL) 80 MG tablet Take 80 mg by mouth daily.        . traMADol (ULTRAM) 50 MG tablet Take by mouth every 6 (six) hours as needed.       No current facility-administered medications for this visit.    Past Medical History  Diagnosis Date  . Coronary atherosclerosis of native coronary artery     Cypher DES LAD 2003, Taxus DES LAD 2004  . Carotid artery disease   . Essential hypertension, benign   . Mixed hyperlipidemia   . Type 2 diabetes mellitus   . Arthritis   . CKD (chronic kidney disease) stage 3, GFR 30-59 ml/min     Social History Ms. Horace reports that she has quit smoking. Her smoking use included Cigarettes. She smoked 0.00 packs per day. She has never used smokeless tobacco. Ms. Galanos reports that she does not drink alcohol.  Review of Systems No palpitations or syncope. Appetite is fair. Arthritic pain, chronic knee pain. Other systems reviewed and negative except as outlined.  Physical Examination Filed Vitals:   11/27/13 1106  BP: 128/76  Pulse: 89   Filed Weights   11/27/13 1106  Weight: 206 lb (93.441 kg)    Overweight woman, appears comfortable at rest.  HEENT: Conjunctiva and lids normal, oropharynx clear.  Neck: Supple, no elevated JVP or carotid  bruits, no thyromegaly.  Lungs: Clear to auscultation, nonlabored breathing at rest.  Cardiac: Regular rate and rhythm, no S3, soft systolic murmur, no pericardial rub.  Abdomen: Soft, nontender, bowel sounds present, no guarding or rebound.  Extremities: No pitting edema, distal pulses 2+.  Skin: Warm and dry.  Musculoskeletal: No kyphosis.  Neuropsychiatric: Alert and oriented x3, affect grossly appropriate.   Problem List and Plan   Coronary atherosclerosis of native coronary artery Symptomatically stable medical therapy. No changes were made today. Followup arranged for 6 months.  Essential hypertension, benign Blood pressure is reasonably well controlled today.  Carotid artery occlusion Less than 50% bilateral ICA stenoses by recent  Dopplers.  Mixed hyperlipidemia Continue Pravachol, keep followup Dr. Willey Blade for regular lab work.    Satira Sark, M.D., F.A.C.C.

## 2013-11-27 NOTE — Patient Instructions (Signed)
Your physician wants you to follow-up in: 6 months You will receive a reminder letter in the mail two months in advance. If you don't receive a letter, please call our office to schedule the follow-up appointment.     Your physician recommends that you continue on your current medications as directed. Please refer to the Current Medication list given to you today.      Thank you for choosing [facility] !        

## 2013-11-27 NOTE — Assessment & Plan Note (Signed)
Symptomatically stable medical therapy. No changes were made today. Followup arranged for 6 months.

## 2013-11-27 NOTE — Assessment & Plan Note (Signed)
Blood pressure is reasonably well controlled today.

## 2013-11-27 NOTE — Assessment & Plan Note (Signed)
Less than 50% bilateral ICA stenoses by recent Dopplers.

## 2014-04-26 ENCOUNTER — Other Ambulatory Visit (HOSPITAL_COMMUNITY): Payer: Self-pay | Admitting: Orthopaedic Surgery

## 2014-04-26 DIAGNOSIS — M5442 Lumbago with sciatica, left side: Secondary | ICD-10-CM

## 2014-05-02 ENCOUNTER — Other Ambulatory Visit (HOSPITAL_COMMUNITY): Payer: Medicare HMO

## 2014-05-07 ENCOUNTER — Other Ambulatory Visit (HOSPITAL_COMMUNITY): Payer: Self-pay | Admitting: Orthopaedic Surgery

## 2014-05-07 ENCOUNTER — Ambulatory Visit (HOSPITAL_COMMUNITY)
Admission: RE | Admit: 2014-05-07 | Discharge: 2014-05-07 | Disposition: A | Discharge status: Home / Self Care | Payer: Commercial Managed Care - HMO | Source: Ambulatory Visit | Attending: Orthopaedic Surgery | Admitting: Orthopaedic Surgery

## 2014-05-07 DIAGNOSIS — Z853 Personal history of malignant neoplasm of breast: Secondary | ICD-10-CM | POA: Diagnosis not present

## 2014-05-07 DIAGNOSIS — M79605 Pain in left leg: Secondary | ICD-10-CM | POA: Insufficient documentation

## 2014-05-07 DIAGNOSIS — M5442 Lumbago with sciatica, left side: Secondary | ICD-10-CM

## 2014-05-07 LAB — POCT I-STAT CREATININE
CREATININE: 1.9 mg/dL — AB (ref 0.50–1.10)
CREATININE: 1.9 mg/dL — AB (ref 0.50–1.10)

## 2014-06-03 ENCOUNTER — Encounter: Payer: Self-pay | Admitting: *Deleted

## 2014-06-03 ENCOUNTER — Encounter: Payer: Self-pay | Admitting: Cardiology

## 2014-06-03 ENCOUNTER — Encounter: Payer: Commercial Managed Care - HMO | Admitting: Cardiology

## 2014-06-03 NOTE — Progress Notes (Signed)
No show  This encounter was created in error - please disregard.

## 2014-07-09 ENCOUNTER — Ambulatory Visit (INDEPENDENT_AMBULATORY_CARE_PROVIDER_SITE_OTHER): Payer: Commercial Managed Care - HMO | Admitting: Cardiology

## 2014-07-09 ENCOUNTER — Encounter: Payer: Self-pay | Admitting: Cardiology

## 2014-07-09 VITALS — BP 140/64 | HR 77 | Ht 64.0 in | Wt 207.0 lb

## 2014-07-09 DIAGNOSIS — I10 Essential (primary) hypertension: Secondary | ICD-10-CM | POA: Diagnosis not present

## 2014-07-09 DIAGNOSIS — E782 Mixed hyperlipidemia: Secondary | ICD-10-CM

## 2014-07-09 DIAGNOSIS — I2510 Atherosclerotic heart disease of native coronary artery without angina pectoris: Secondary | ICD-10-CM

## 2014-07-09 NOTE — Patient Instructions (Signed)
Your physician wants you to follow-up in: 6 months with Dr.McDowell You will receive a reminder letter in the mail two months in advance. If you don't receive a letter, please call our office to schedule the follow-up appointment.     Your physician recommends that you continue on your current medications as directed. Please refer to the Current Medication list given to you today.     Thank you for choosing  [facility] !        

## 2014-07-09 NOTE — Progress Notes (Signed)
Cardiology Office Note  Date: 07/09/2014   ID: Marie House, DOB [birth year], MRN [id number]  PCP: Asencion Noble, MD  Primary Cardiologist: Rozann Lesches, MD   Chief Complaint  Patient presents with  . Coronary Artery Disease  . Hyperlipidemia    History of Present Illness: Marie House is a 80 y.o. female last seen in September 2015. She presents for a follow-up visit. Reports no angina symptoms or nitroglycerin use. Her husband passed away this past 2022-11-18, and she is still late in the grieving process. She gets out some with friends, but is limited in her ambulation due to knee and hip arthritis.  We reviewed her cardiac medications which are stable and outlined below.  Echocardiogram from last year is reviewed below.  Past Medical History  Diagnosis Date  . Coronary atherosclerosis of native coronary artery     Cypher DES LAD 2003, Taxus DES LAD 2004  . Carotid artery disease   . Essential hypertension, benign   . Mixed hyperlipidemia   . Type 2 diabetes mellitus   . Arthritis   . CKD (chronic kidney disease) stage 3, GFR 30-59 ml/min     Past Surgical History  Procedure Laterality Date  . Tonsillectomy    . Mastectomy Left   . Cholecystectomy    . Partial hysterectomy    . Cataract extraction    . Carotid endarterectomy Right 2004    Current Outpatient Prescriptions  Medication Sig Dispense Refill  . ALPRAZolam (XANAX) 0.5 MG tablet Take 0.5 mg by mouth 3 (three) times daily as needed for anxiety.    Marland Kitchen aspirin EC 81 MG tablet Take 81 mg by mouth daily.    . carvedilol (COREG) 12.5 MG tablet Take 12.5 mg by mouth 2 (two) times daily with a meal.     . enalapril (VASOTEC) 5 MG tablet Take 5 mg by mouth daily.    . fenofibrate (TRICOR) 145 MG tablet Take 145 mg by mouth daily.    . hydrochlorothiazide (HYDRODIURIL) 25 MG tablet Take 25 mg by mouth daily.    . insulin aspart (NOVOLOG) 100 UNIT/ML injection Inject 10 Units into the skin 3 (three) times daily  before meals.    . insulin glargine (LANTUS) 100 UNIT/ML injection Inject 80 Units into the skin daily.    Marland Kitchen omeprazole (PRILOSEC) 20 MG capsule Take 20 mg by mouth daily.    . pravastatin (PRAVACHOL) 80 MG tablet Take 80 mg by mouth daily.    . traMADol (ULTRAM) 50 MG tablet Take by mouth every 6 (six) hours as needed.     No current facility-administered medications for this visit.    Allergies:  Altace; Atacand; Avandia; Crestor; Diovan; Lisinopril; Lotensin; Norvasc; and Zetia   Social History: The patient  reports that she has quit smoking. Her smoking use included Cigarettes. She has never used smokeless tobacco. She reports that she does not drink alcohol or use illicit drugs.   ROS:  Please see the history of present illness. Otherwise, complete review of systems is positive for arthritic pains.  All other systems are reviewed and negative.   Physical Exam: VS:  BP 140/64 mmHg  Pulse 77  Ht 5\' 4"  (1.626 m)  Wt 207 lb (93.895 kg)  BMI 35.51 kg/m2  SpO2 96%, BMI Body mass index is 35.51 kg/(m^2).  Wt Readings from Last 3 Encounters:  07/09/14 207 lb (93.895 kg)  05/07/14 200 lb (90.719 kg)  11/27/13 206 lb (93.441 kg)  General: Patient appears comfortable at rest. HEENT: Conjunctiva and lids normal, oropharynx clear with moist mucosa. Neck: Supple, no elevated JVP or carotid bruits, no thyromegaly. Lungs: Clear to auscultation, nonlabored breathing at rest. Cardiac: Regular rate and rhythm, no S3 or significant systolic murmur, no pericardial rub. Abdomen: Soft, nontender, no hepatomegaly, bowel sounds present, no guarding or rebound. Extremities: No pitting edema, distal pulses 2+. Skin: Warm and dry. Musculoskeletal: No kyphosis. Neuropsychiatric: Alert and oriented x3, affect grossly appropriate.   ECG: ECG is ordered today and shows normal sinus rhythm.  Recent Labwork: 05/07/2014: Creatinine 1.90*   Other Studies Reviewed Today:  Echocardiogram  06/12/2013: Study Conclusions  - Study data: Technically adequate study. - Left ventricle: The cavity size was normal. Wall thickness was normal. Systolic function was vigorous. The estimated ejection fraction was in the range of 65% to 70%. There is LV cavity obliteration during systole that causes a mild intracavitary gradient. Doppler parameters are consistent with abnormal left ventricular relaxation (grade 1 diastolic dysfunction). - Aortic valve: Mildly calcified annulus. Mildly thickened leaflets. Valve area: 2.95cm^2(VTI). Valve area: 3.08cm^2 (Vmax). - Mitral valve: Mildly to moderately calcified annulus. Mildly thickened leaflets . - Systemic veins: The IVC is small, consistent with low RA pressures and hypovolemia.   ASSESSMENT AND PLAN:  1. Symptomatically stable CAD status post DES to the LAD in 2003 and 2004. Continue medical therapy and observation.  2. Essential hypertension, no change in antihypertensive regimen.  3. Hyperlipidemia, on Pravachol and TriCor. Keep follow-up with Dr. Willey Blade.  Current medicines were reviewed at length with the patient today.   Disposition: FU with me in 6 months.   Signed, Satira Sark, MD, [facility] 07/09/2014 2:58 PM    [facility] [address], [place], [place] [address] Phone: [phone]; Fax: [phone]

## 2014-07-11 NOTE — Addendum Note (Signed)
Addended by: Levonne Hubert on: 07/11/2014 01:06 PM   Modules accepted: Orders

## 2014-09-20 ENCOUNTER — Encounter: Payer: Self-pay | Admitting: *Deleted

## 2014-10-08 ENCOUNTER — Encounter: Payer: Self-pay | Admitting: Cardiovascular Disease

## 2015-01-29 ENCOUNTER — Encounter: Payer: Self-pay | Admitting: Cardiology

## 2015-01-29 ENCOUNTER — Ambulatory Visit (INDEPENDENT_AMBULATORY_CARE_PROVIDER_SITE_OTHER): Payer: Commercial Managed Care - HMO | Admitting: Cardiology

## 2015-01-29 VITALS — BP 150/74 | HR 78 | Ht 64.0 in | Wt 205.0 lb

## 2015-01-29 DIAGNOSIS — E782 Mixed hyperlipidemia: Secondary | ICD-10-CM

## 2015-01-29 DIAGNOSIS — I25119 Atherosclerotic heart disease of native coronary artery with unspecified angina pectoris: Secondary | ICD-10-CM

## 2015-01-29 MED ORDER — ISOSORBIDE MONONITRATE ER 30 MG PO TB24: 30.0000 mg | ORAL_TABLET | Freq: Every day | ORAL | Status: DC

## 2015-01-29 NOTE — Progress Notes (Signed)
Cardiology Office Note  Date: 01/29/2015   ID: [name], DOB 1934-07-13, MRN [id number]  PCP: Asencion Noble, MD  Primary Cardiologist: Rozann Lesches, MD   Chief Complaint  Patient presents with  . Coronary Artery Disease    History of Present Illness: Marie House is a 80 y.o. female last seen in April. She presents for a routine follow-up visit. She states that he has been under a lot of stress. Her brother passed away earlier this year, and she has a son undergoing treatments for lung cancer. She does report having some recent episodes of angina that have resolved with a single nitroglycerin. She reports compliance with her medications. She has chronic but stable dyspnea on exertion.  Lexiscan Cardiolite at [facility] back in January 2010 was negative for ischemia. We reviewed her medications which are outlined below. We discussed adding Imdur to see if this helps with any of her symptoms, also follow-up stress testing, but she wanted to hold off on in testing at this time.  Past Medical History  Diagnosis Date  . Coronary atherosclerosis of native coronary artery     Cypher DES LAD 2003, Taxus DES LAD 2004  . Carotid artery disease ([place])   . Essential hypertension, benign   . Mixed hyperlipidemia   . Type 2 diabetes mellitus ([place])   . Arthritis   . CKD (chronic kidney disease) stage 3, GFR 30-59 ml/min     Current Outpatient Prescriptions  Medication Sig Dispense Refill  . ALPRAZolam (XANAX) 0.5 MG tablet Take 0.5 mg by mouth 3 (three) times daily as needed for anxiety.    Marland Kitchen aspirin EC 81 MG tablet Take 81 mg by mouth daily.    . carvedilol (COREG) 12.5 MG tablet Take 12.5 mg by mouth 2 (two) times daily with a meal.     . enalapril (VASOTEC) 5 MG tablet Take 5 mg by mouth daily.    . hydrochlorothiazide (HYDRODIURIL) 25 MG tablet Take 25 mg by mouth daily.    Marland Kitchen HYDROcodone-acetaminophen (NORCO/VICODIN) 5-325 MG tablet TK 1 T PO Q 4 H PRF PAIN  0  . insulin aspart (NOVOLOG)  100 UNIT/ML injection Inject 10 Units into the skin 3 (three) times daily before meals.    . insulin glargine (LANTUS) 100 UNIT/ML injection Inject 80 Units into the skin daily.    Marland Kitchen omeprazole (PRILOSEC) 20 MG capsule Take 20 mg by mouth daily.    . pravastatin (PRAVACHOL) 80 MG tablet Take 80 mg by mouth daily.     No current facility-administered medications for this visit.    Allergies:  Altace; Atacand; Avandia; Crestor; Diovan; Lisinopril; Lotensin; Norvasc; and Zetia   Social History: The patient  reports that she has quit smoking. Her smoking use included Cigarettes. She has never used smokeless tobacco. She reports that she does not drink alcohol or use illicit drugs.   ROS:  Please see the history of present illness. Otherwise, complete review of systems is positive for chronic arthritic pains.  All other systems are reviewed and negative.   Physical Exam: VS:  BP 150/74 mmHg  Pulse 78  Ht 5\' 4"  (1.626 m)  Wt 205 lb (92.987 kg)  BMI 35.17 kg/m2  SpO2 97%, BMI Body mass index is 35.17 kg/(m^2).  Wt Readings from Last 3 Encounters:  01/29/15 205 lb (92.987 kg)  07/09/14 207 lb (93.895 kg)  05/07/14 200 lb (90.719 kg)     General: Patient appears comfortable at rest. HEENT: Conjunctiva and lids  normal, oropharynx clear with moist mucosa. Neck: Supple, no elevated JVP or carotid bruits, no thyromegaly. Lungs: Clear to auscultation, nonlabored breathing at rest. Cardiac: Regular rate and rhythm, no S3 or significant systolic murmur, no pericardial rub. Abdomen: Soft, nontender, no hepatomegaly, bowel sounds present, no guarding or rebound. Extremities: No pitting edema, distal pulses 2+.  ECG: ECG is not ordered today.  Recent Labwork: 05/07/2014: Creatinine, Ser 1.90*  July 2016: BUN 21, creatinine 1.5, potassium 4.7, AST 24, ALT 21, hemoglobin 12.0, platelets 205, cholesterol 215, triglycerides 256, HDL 62, LDL 102  Other Studies Reviewed Today:  Echocardiogram  06/12/2013: Study Conclusions  - Study data: Technically adequate study. - Left ventricle: The cavity size was normal. Wall thickness was normal. Systolic function was vigorous. The estimated ejection fraction was in the range of 65% to 70%. There is LV cavity obliteration during systole that causes a mild intracavitary gradient. Doppler parameters are consistent with abnormal left ventricular relaxation (grade 1 diastolic dysfunction). - Aortic valve: Mildly calcified annulus. Mildly thickened leaflets. Valve area: 2.95cm^2(VTI). Valve area: 3.08cm^2 (Vmax). - Mitral valve: Mildly to moderately calcified annulus. Mildly thickened leaflets . - Systemic veins: The IVC is small, consistent with low RA pressures and hypovolemia.  ASSESSMENT AND PLAN:  1. CAD status post DES interventions to the LAD in 2003 and 2004. She had negative ischemic testing in 2010, but has had some recent angina symptoms in the setting of stress. We will add Imdur 30 mg daily. She did not want to do any other testing at this time, but will let us know if symptoms progress.  2. Hyperlipidemia, she continues on Pravachol. Recent lipid numbers reviewed.  Current medicines were reviewed at length with the patient today.  Disposition: FU with me in 6 months.   Signed, Satira Sark, MD, [facility] 01/29/2015 4:31 PM     [facility] at [facility] [address] [place], [place] [address] Phone: [phone]; Fax: [phone]

## 2015-01-29 NOTE — Patient Instructions (Signed)
Medication Instructions:  START IMDUR 30 MG DAILY  Labwork: NONE  Testing/Procedures: NONE  Follow-Up: Your physician wants you to follow-up in: [place]. [place]. You will receive a reminder letter in the mail two months in advance. If you don't receive a letter, please call our office to schedule the follow-up appointment.   Any Other Special Instructions Will Be Listed Below (If Applicable).     If you need a refill on your cardiac medications before your next appointment, please call your pharmacy.   Thanks for choosing [facility]!!!

## 2015-04-02 DIAGNOSIS — X32XXXD Exposure to sunlight, subsequent encounter: Secondary | ICD-10-CM | POA: Diagnosis not present

## 2015-04-02 DIAGNOSIS — L905 Scar conditions and fibrosis of skin: Secondary | ICD-10-CM | POA: Diagnosis not present

## 2015-04-02 DIAGNOSIS — L570 Actinic keratosis: Secondary | ICD-10-CM | POA: Diagnosis not present

## 2015-04-02 DIAGNOSIS — C44310 Basal cell carcinoma of skin of unspecified parts of face: Secondary | ICD-10-CM | POA: Diagnosis not present

## 2015-04-02 DIAGNOSIS — D2339 Other benign neoplasm of skin of other parts of face: Secondary | ICD-10-CM | POA: Diagnosis not present

## 2015-04-02 DIAGNOSIS — D225 Melanocytic nevi of trunk: Secondary | ICD-10-CM | POA: Diagnosis not present

## 2015-04-02 DIAGNOSIS — L821 Other seborrheic keratosis: Secondary | ICD-10-CM | POA: Diagnosis not present

## 2015-04-14 DIAGNOSIS — Z79899 Other long term (current) drug therapy: Secondary | ICD-10-CM | POA: Diagnosis not present

## 2015-04-14 DIAGNOSIS — E119 Type 2 diabetes mellitus without complications: Secondary | ICD-10-CM | POA: Diagnosis not present

## 2015-04-21 DIAGNOSIS — N184 Chronic kidney disease, stage 4 (severe): Secondary | ICD-10-CM | POA: Diagnosis not present

## 2015-04-21 DIAGNOSIS — E1129 Type 2 diabetes mellitus with other diabetic kidney complication: Secondary | ICD-10-CM | POA: Diagnosis not present

## 2015-07-29 ENCOUNTER — Encounter: Payer: Self-pay | Admitting: Orthopaedic Surgery

## 2015-07-29 ENCOUNTER — Ambulatory Visit (INDEPENDENT_AMBULATORY_CARE_PROVIDER_SITE_OTHER): Payer: Medicare Other | Admitting: Orthopaedic Surgery

## 2015-07-29 VITALS — BP 152/68 | HR 75 | Temp 97.5°F | Ht 64.0 in | Wt 194.0 lb

## 2015-07-29 DIAGNOSIS — M25561 Pain in right knee: Secondary | ICD-10-CM | POA: Diagnosis not present

## 2015-07-29 DIAGNOSIS — M25562 Pain in left knee: Secondary | ICD-10-CM

## 2015-07-29 DIAGNOSIS — I10 Essential (primary) hypertension: Secondary | ICD-10-CM

## 2015-07-29 NOTE — Progress Notes (Signed)
CC: Both of my knees are hurting. I would like an injection in both knees.  The patient has had chronic pain and tenderness of both knees for some time.  Injections help.  There is no locking or giving way of the knee.  There is no new trauma. There is no redness or signs of infections.  The knees have a mild effusion and some crepitus.  There is no redness or signs of recent trauma.  Impression:  Chronic pain of the both knees  Return:  As needed.  PROCEDURE NOTE:  The patient requests injections of the left knee , verbal consent was obtained.  The left knee was prepped appropriately after time out was performed.   Sterile technique was observed and injection of 1 cc of Depo-Medrol 40 mg with several cc's of plain xylocaine. Anesthesia was provided by ethyl chloride and a 20-gauge needle was used to inject the knee area. The injection was tolerated well.  A band aid dressing was applied.  The patient was advised to apply ice later today and tomorrow to the injection sight as needed.  PROCEDURE NOTE:  The patient requests injections of the right knee , verbal consent was obtained.  The right knee was prepped appropriately after time out was performed.   Sterile technique was observed and injection of 1 cc of Depo-Medrol 40 mg with several cc's of plain xylocaine. Anesthesia was provided by ethyl chloride and a 20-gauge needle was used to inject the knee area. The injection was tolerated well.  A band aid dressing was applied.  The patient was advised to apply ice later today and tomorrow to the injection sight as needed.

## 2015-08-14 ENCOUNTER — Encounter: Payer: Self-pay | Admitting: Cardiology

## 2015-08-14 ENCOUNTER — Ambulatory Visit (INDEPENDENT_AMBULATORY_CARE_PROVIDER_SITE_OTHER): Payer: Medicare Other | Admitting: Cardiology

## 2015-08-14 VITALS — BP 144/78 | HR 84 | Ht 64.0 in | Wt 205.0 lb

## 2015-08-14 DIAGNOSIS — I25119 Atherosclerotic heart disease of native coronary artery with unspecified angina pectoris: Secondary | ICD-10-CM

## 2015-08-14 DIAGNOSIS — E782 Mixed hyperlipidemia: Secondary | ICD-10-CM | POA: Diagnosis not present

## 2015-08-14 DIAGNOSIS — I10 Essential (primary) hypertension: Secondary | ICD-10-CM

## 2015-08-14 NOTE — Patient Instructions (Signed)
Your physician wants you to follow-up in: 6 months You will receive a reminder letter in the mail two months in advance. If you don't receive a letter, please call our office to schedule the follow-up appointment.    Start taking Imdur 15 mg at bedtime (cut your 30 mg tablet in half)    If you need a refill on your cardiac medications before your next appointment, please call your pharmacy.    Thank you for choosing [facility] !

## 2015-08-14 NOTE — Progress Notes (Signed)
Cardiology Office Note  Date: 08/14/2015   ID: Marie House, DOB 03/28/1934, MRN [id number]  PCP: Asencion Noble, MD  Primary Cardiologist: Rozann Lesches, MD   Chief Complaint  Patient presents with  . Coronary Artery Disease    History of Present Illness: Marie House is a 81 y.o. female last seen in November 2016. She presents for a routine follow-up visit. States that she tried Imdur after the last encounter, it did help her shortness of breath, but she only took it for 3 days due to headache and ringing in her ears. We talked about trying to use half dose in the evening to see if she might be able to tolerate this better.  I reviewed her medications otherwise, cardiac regimen is stable and includes aspirin, Coreg, Vasotec, HCTZ, and Pravachol.  ECG today shows sinus rhythm with borderline low voltage in the anterior leads with anteroseptal Q waves.  Continues to follow with Dr. Willey Blade.  Past Medical History  Diagnosis Date  . Coronary atherosclerosis of native coronary artery     Cypher DES LAD 2003, Taxus DES LAD 2004  . Carotid artery disease ([place])   . Essential hypertension, benign   . Mixed hyperlipidemia   . Type 2 diabetes mellitus ([place])   . Arthritis   . CKD (chronic kidney disease) stage 3, GFR 30-59 ml/min     Current Outpatient Prescriptions  Medication Sig Dispense Refill  . ALPRAZolam (XANAX) 0.5 MG tablet Take 0.5 mg by mouth 3 (three) times daily as needed for anxiety.    Marland Kitchen aspirin EC 81 MG tablet Take 81 mg by mouth daily.    . carvedilol (COREG) 12.5 MG tablet Take 12.5 mg by mouth 2 (two) times daily with a meal.     . enalapril (VASOTEC) 5 MG tablet Take 5 mg by mouth daily.    . hydrochlorothiazide (HYDRODIURIL) 25 MG tablet Take 25 mg by mouth daily.    . insulin aspart (NOVOLOG) 100 UNIT/ML injection Inject 10 Units into the skin 3 (three) times daily before meals.    . insulin glargine (LANTUS) 100 UNIT/ML injection Inject 80 Units into the skin  daily.    . pravastatin (PRAVACHOL) 80 MG tablet Take 80 mg by mouth daily.     No current facility-administered medications for this visit.   Allergies:  Altace; Atacand; Avandia; Crestor; Diovan; Lisinopril; Lotensin; Norvasc; and Zetia   Social History: The patient  reports that she quit smoking about 9 years ago. Her smoking use included Cigarettes. She has never used smokeless tobacco. She reports that she does not drink alcohol or use illicit drugs.   ROS:  Please see the history of present illness. Otherwise, complete review of systems is positive for arthritic pains and fatigue.  All other systems are reviewed and negative.   Physical Exam: VS:  BP 144/78 mmHg  Pulse 84  Ht 5\' 4"  (1.626 m)  Wt 205 lb (92.987 kg)  BMI 35.17 kg/m2  SpO2 98%, BMI Body mass index is 35.17 kg/(m^2).  Wt Readings from Last 3 Encounters:  08/14/15 205 lb (92.987 kg)  07/29/15 194 lb (87.998 kg)  01/29/15 205 lb (92.987 kg)    General: Patient appears comfortable at rest. HEENT: Conjunctiva and lids normal, oropharynx clear with moist mucosa. Neck: Supple, no elevated JVP or carotid bruits, no thyromegaly. Lungs: Clear to auscultation, nonlabored breathing at rest. Cardiac: Regular rate and rhythm, no S3 or significant systolic murmur, no pericardial rub. Abdomen: Soft, nontender, no  hepatomegaly, bowel sounds present, no guarding or rebound. Extremities: No pitting edema, distal pulses 2+.  ECG: I personally reviewed the tracing from 07/09/2014 which showed sinus rhythm.  Recent Labwork:  July 2016: BUN 21, creatinine 1.56, potassium 4.7, AST 24, ALT 21, hemoglobin 12.0, platelets 205, cholesterol 215, triglycerides 256, HDL 62, LDL 102, hemoglobin A1c 7.6  Other Studies Reviewed Today:  Echocardiogram 06/12/2013: Study Conclusions  - Study data: Technically adequate study. - Left ventricle: The cavity size was normal. Wall thickness was normal. Systolic function was vigorous. The  estimated ejection fraction was in the range of 65% to 70%. There is LV cavity obliteration during systole that causes a mild intracavitary gradient. Doppler parameters are consistent with abnormal left ventricular relaxation (grade 1 diastolic dysfunction). - Aortic valve: Mildly calcified annulus. Mildly thickened leaflets. Valve area: 2.95cm^2(VTI). Valve area: 3.08cm^2 (Vmax). - Mitral valve: Mildly to moderately calcified annulus. Mildly thickened leaflets . - Systemic veins: The IVC is small, consistent with low RA pressures and hypovolemia.  Assessment and Plan:  1. CAD status post previous DES interventions to the LAD in 2003 and 2004. She continues to prefer to hold off on stress testing. We will continue medical therapy, try taking Imdur 15 mg once daily in the evenings. Follow-up arranged.  2. Essential hypertension, no changes made to antihypertensive regimen.  3. Hyperlipidemia, on Pravachol. Last LDL 102.  Current medicines were reviewed with the patient today.   Orders Placed This Encounter  Procedures  . EKG 12-Lead    Disposition: FU with me in 6 months.   Signed, Satira Sark, MD, [facility] 08/14/2015 11:44 AM    [facility] at [facility] [address], [place], [place] [address] Phone: [phone]; Fax: [phone]

## 2015-08-18 DIAGNOSIS — N181 Chronic kidney disease, stage 1: Secondary | ICD-10-CM | POA: Diagnosis not present

## 2015-08-18 DIAGNOSIS — E785 Hyperlipidemia, unspecified: Secondary | ICD-10-CM | POA: Diagnosis not present

## 2015-08-18 DIAGNOSIS — Z79899 Other long term (current) drug therapy: Secondary | ICD-10-CM | POA: Diagnosis not present

## 2015-08-18 DIAGNOSIS — E119 Type 2 diabetes mellitus without complications: Secondary | ICD-10-CM | POA: Diagnosis not present

## 2015-08-25 DIAGNOSIS — N184 Chronic kidney disease, stage 4 (severe): Secondary | ICD-10-CM | POA: Diagnosis not present

## 2015-08-25 DIAGNOSIS — E1129 Type 2 diabetes mellitus with other diabetic kidney complication: Secondary | ICD-10-CM | POA: Diagnosis not present

## 2015-08-25 DIAGNOSIS — I2510 Atherosclerotic heart disease of native coronary artery without angina pectoris: Secondary | ICD-10-CM | POA: Diagnosis not present

## 2015-10-01 DIAGNOSIS — H31012 Macula scars of posterior pole (postinflammatory) (post-traumatic), left eye: Secondary | ICD-10-CM | POA: Diagnosis not present

## 2015-10-14 ENCOUNTER — Emergency Department (HOSPITAL_COMMUNITY): Payer: Medicare Other

## 2015-10-14 ENCOUNTER — Emergency Department (HOSPITAL_COMMUNITY)
Admission: EM | Admit: 2015-10-14 | Discharge: 2015-10-14 | Disposition: A | Discharge status: Home / Self Care | Payer: Medicare Other | Attending: Emergency Medicine | Admitting: Emergency Medicine

## 2015-10-14 ENCOUNTER — Encounter (HOSPITAL_COMMUNITY): Payer: Self-pay

## 2015-10-14 DIAGNOSIS — S8262XA Displaced fracture of lateral malleolus of left fibula, initial encounter for closed fracture: Secondary | ICD-10-CM | POA: Diagnosis not present

## 2015-10-14 DIAGNOSIS — W19XXXA Unspecified fall, initial encounter: Secondary | ICD-10-CM

## 2015-10-14 DIAGNOSIS — Z7982 Long term (current) use of aspirin: Secondary | ICD-10-CM | POA: Insufficient documentation

## 2015-10-14 DIAGNOSIS — Z79899 Other long term (current) drug therapy: Secondary | ICD-10-CM | POA: Diagnosis not present

## 2015-10-14 DIAGNOSIS — Z23 Encounter for immunization: Secondary | ICD-10-CM | POA: Insufficient documentation

## 2015-10-14 DIAGNOSIS — S8992XA Unspecified injury of left lower leg, initial encounter: Secondary | ICD-10-CM | POA: Diagnosis not present

## 2015-10-14 DIAGNOSIS — Z794 Long term (current) use of insulin: Secondary | ICD-10-CM | POA: Insufficient documentation

## 2015-10-14 DIAGNOSIS — R52 Pain, unspecified: Secondary | ICD-10-CM | POA: Diagnosis not present

## 2015-10-14 DIAGNOSIS — R42 Dizziness and giddiness: Secondary | ICD-10-CM | POA: Diagnosis not present

## 2015-10-14 DIAGNOSIS — R55 Syncope and collapse: Secondary | ICD-10-CM | POA: Diagnosis not present

## 2015-10-14 DIAGNOSIS — E1122 Type 2 diabetes mellitus with diabetic chronic kidney disease: Secondary | ICD-10-CM | POA: Insufficient documentation

## 2015-10-14 DIAGNOSIS — Y929 Unspecified place or not applicable: Secondary | ICD-10-CM | POA: Insufficient documentation

## 2015-10-14 DIAGNOSIS — Y939 Activity, unspecified: Secondary | ICD-10-CM | POA: Diagnosis not present

## 2015-10-14 DIAGNOSIS — N183 Chronic kidney disease, stage 3 (moderate): Secondary | ICD-10-CM | POA: Insufficient documentation

## 2015-10-14 DIAGNOSIS — Y999 Unspecified external cause status: Secondary | ICD-10-CM | POA: Diagnosis not present

## 2015-10-14 DIAGNOSIS — S99912A Unspecified injury of left ankle, initial encounter: Secondary | ICD-10-CM | POA: Diagnosis not present

## 2015-10-14 DIAGNOSIS — I2510 Atherosclerotic heart disease of native coronary artery without angina pectoris: Secondary | ICD-10-CM | POA: Diagnosis not present

## 2015-10-14 DIAGNOSIS — S82402A Unspecified fracture of shaft of left fibula, initial encounter for closed fracture: Secondary | ICD-10-CM

## 2015-10-14 DIAGNOSIS — S50311A Abrasion of right elbow, initial encounter: Secondary | ICD-10-CM | POA: Diagnosis not present

## 2015-10-14 DIAGNOSIS — M25571 Pain in right ankle and joints of right foot: Secondary | ICD-10-CM | POA: Diagnosis not present

## 2015-10-14 DIAGNOSIS — Z87891 Personal history of nicotine dependence: Secondary | ICD-10-CM | POA: Diagnosis not present

## 2015-10-14 DIAGNOSIS — I129 Hypertensive chronic kidney disease with stage 1 through stage 4 chronic kidney disease, or unspecified chronic kidney disease: Secondary | ICD-10-CM | POA: Insufficient documentation

## 2015-10-14 DIAGNOSIS — M79672 Pain in left foot: Secondary | ICD-10-CM | POA: Diagnosis not present

## 2015-10-14 DIAGNOSIS — M25572 Pain in left ankle and joints of left foot: Secondary | ICD-10-CM | POA: Diagnosis not present

## 2015-10-14 DIAGNOSIS — M79662 Pain in left lower leg: Secondary | ICD-10-CM | POA: Diagnosis not present

## 2015-10-14 LAB — CBC WITH DIFFERENTIAL/PLATELET
BASOS ABS: 0 10*3/uL (ref 0.0–0.1)
BASOS PCT: 0 %
Eosinophils Absolute: 0.2 10*3/uL (ref 0.0–0.7)
Eosinophils Relative: 2 %
HEMATOCRIT: 35.2 % — AB (ref 36.0–46.0)
HEMOGLOBIN: 11.4 g/dL — AB (ref 12.0–15.0)
LYMPHS PCT: 28 %
Lymphs Abs: 2.3 10*3/uL (ref 0.7–4.0)
MCH: 30.8 pg (ref 26.0–34.0)
MCHC: 32.4 g/dL (ref 30.0–36.0)
MCV: 95.1 fL (ref 78.0–100.0)
MONO ABS: 0.7 10*3/uL (ref 0.1–1.0)
Monocytes Relative: 8 %
NEUTROS ABS: 5.2 10*3/uL (ref 1.7–7.7)
NEUTROS PCT: 62 %
Platelets: 199 10*3/uL (ref 150–400)
RBC: 3.7 MIL/uL — AB (ref 3.87–5.11)
RDW: 13.4 % (ref 11.5–15.5)
WBC: 8.3 10*3/uL (ref 4.0–10.5)

## 2015-10-14 LAB — URINALYSIS, ROUTINE W REFLEX MICROSCOPIC
Bilirubin Urine: NEGATIVE
GLUCOSE, UA: NEGATIVE mg/dL
Hgb urine dipstick: NEGATIVE
Ketones, ur: NEGATIVE mg/dL
Leukocytes, UA: NEGATIVE
NITRITE: NEGATIVE
Protein, ur: NEGATIVE mg/dL
SPECIFIC GRAVITY, URINE: 1.01 (ref 1.005–1.030)
pH: 6 (ref 5.0–8.0)

## 2015-10-14 LAB — BASIC METABOLIC PANEL
ANION GAP: 10 (ref 5–15)
BUN: 28 mg/dL — ABNORMAL HIGH (ref 6–20)
CO2: 26 mmol/L (ref 22–32)
Calcium: 8.9 mg/dL (ref 8.9–10.3)
Chloride: 106 mmol/L (ref 101–111)
Creatinine, Ser: 1.53 mg/dL — ABNORMAL HIGH (ref 0.44–1.00)
GFR, EST AFRICAN AMERICAN: 36 mL/min — AB (ref >60)
GFR, EST NON AFRICAN AMERICAN: 31 mL/min — AB (ref >60)
Glucose, Bld: 131 mg/dL — ABNORMAL HIGH (ref 65–99)
Potassium: 4.6 mmol/L (ref 3.5–5.1)
Sodium: 142 mmol/L (ref 135–145)

## 2015-10-14 MED ORDER — TRAMADOL-ACETAMINOPHEN 37.5-325 MG PO TABS: 1.0000 | ORAL_TABLET | Freq: Four times a day (QID) | ORAL | 0 refills | Status: DC | PRN

## 2015-10-14 MED ORDER — TETANUS-DIPHTH-ACELL PERTUSSIS 5-2.5-18.5 LF-MCG/0.5 IM SUSP
0.5000 mL | Freq: Once | INTRAMUSCULAR | Status: AC
  Administered 2015-10-14: 0.5 mL via INTRAMUSCULAR
  Filled 2015-10-14: qty 0.5

## 2015-10-14 NOTE — ED Provider Notes (Addendum)
[facility] DEPT Provider Note   CSN: [id number] Arrival date & time: 10/14/15  [id number]  First Provider Contact:  First MD Initiated Contact with Patient 10/14/15 [phone]     By signing my name below, I, Marie House, attest that this documentation has been prepared under the direction and in the presence of Daleen Bo, MD.  Electronically Signed: Julien House, ED Scribe. 10/14/15. 9:00 AM.    History   Chief Complaint Chief Complaint  Patient presents with  . Fall    The history is provided by the patient. No language interpreter was used.   HPI Comments: Marie House is a 81 y.o. female brought in by ambulance, who has a PMhx of arthritis, CAD, CKD, HTN, HLD and DMII presents to the Emergency Department presenting after a fall that occurred last night around 10pm. Pt says that her left foot gave out on her and she fell, injuring her left ankle. She also notes injuring her right elbow, accumulating small abrasions with bleeding controlled. Pt did not hit her head or lose consciousness. She says she is unable to ambulate or apply pressure to her injured ankle. Per family, pt has been having intermittent, room-spinning dizzy spells for the past 3 days. She takes one aspirin a day. She denies neck pain, back pain, headache, abdominal pain, or hx of having vertigo. Pt is unsure of her last tetanus shot.   Past Medical History:  Diagnosis Date  . Arthritis   . Carotid artery disease ([place])   . CKD (chronic kidney disease) stage 3, GFR 30-59 ml/min   . Coronary atherosclerosis of native coronary artery    Cypher DES LAD 2003, Taxus DES LAD 2004  . Essential hypertension, benign   . Mixed hyperlipidemia   . Type 2 diabetes mellitus [facility]     Patient Active Problem List   Diagnosis Date Noted  . Coronary atherosclerosis of native coronary artery 06/07/2013  . Carotid artery occlusion 06/07/2013  . CKD (chronic kidney disease) stage 3, GFR 30-59 ml/min 06/06/2013  . Type 2 diabetes  mellitus ([place]) 06/06/2013  . Mixed hyperlipidemia 06/06/2013  . Essential hypertension, benign 06/06/2013    Past Surgical History:  Procedure Laterality Date  . CAROTID ENDARTERECTOMY Right 2004  . CATARACT EXTRACTION    . CHOLECYSTECTOMY    . MASTECTOMY Left   . PARTIAL HYSTERECTOMY    . TONSILLECTOMY      OB History    No data available       Home Medications    Prior to Admission medications   Medication Sig Start Date End Date Taking? Authorizing Provider  ALPRAZolam Duanne Moron) 0.5 MG tablet Take 0.5 mg by mouth 3 (three) times daily as needed for anxiety.   Yes Historical Provider, MD  aspirin EC 81 MG tablet Take 81 mg by mouth daily.   Yes Historical Provider, MD  carvedilol (COREG) 12.5 MG tablet Take 12.5 mg by mouth 2 (two) times daily with a meal.    Yes Historical Provider, MD  enalapril (VASOTEC) 5 MG tablet Take 5 mg by mouth daily.   Yes Historical Provider, MD  hydrochlorothiazide (HYDRODIURIL) 25 MG tablet Take 25 mg by mouth daily.   Yes Historical Provider, MD  insulin aspart (NOVOLOG) 100 UNIT/ML injection Inject 10 Units into the skin 2 (two) times daily.    Yes Historical Provider, MD  insulin glargine (LANTUS) 100 UNIT/ML injection Inject 40 Units into the skin 2 (two) times daily.    Yes Historical Provider, MD  isosorbide mononitrate (IMDUR) 30 MG 24 hr tablet Take 15 mg by mouth at bedtime. Take 1/2 tablet at night 15 mg starting 08/14/15    Yes Historical Provider, MD  pravastatin (PRAVACHOL) 80 MG tablet Take 80 mg by mouth daily.   Yes Historical Provider, MD  traMADol-acetaminophen (ULTRACET) 37.5-325 MG tablet Take 1 tablet by mouth every 6 (six) hours as needed for moderate pain. 10/14/15   Daleen Bo, MD    Family History Family History  Problem Relation Age of Onset  . CAD Mother     Social History Social History  Substance Use Topics  . Smoking status: Former Smoker    Types: Cigarettes    Quit date: 03/15/2006  . Smokeless tobacco: Never  Used  . Alcohol use No     Allergies   Altace [ramipril]; Atacand [candesartan]; Avandia [rosiglitazone]; Crestor [rosuvastatin]; Diovan [valsartan]; Lisinopril; Lotensin [benazepril]; Norvasc [amlodipine besylate]; and Zetia [ezetimibe]   Review of Systems Review of Systems  Gastrointestinal: Negative for abdominal pain.  Musculoskeletal: Positive for arthralgias and joint swelling. Negative for back pain and neck pain.  Neurological: Positive for dizziness. Negative for headaches.  All other systems reviewed and are negative.    Physical Exam Updated Vital Signs BP 194/69   Pulse 82   Temp 98 F (36.7 C) (Oral)   Resp 20   Ht 5\' 5"  (1.651 m)   Wt 204 lb (92.5 kg)   SpO2 95%   BMI 33.95 kg/m   Physical Exam  Constitutional: She is oriented to person, place, and time. She appears well-developed and well-nourished.  HENT:  Head: Normocephalic and atraumatic.  Eyes: Conjunctivae and EOM are normal. Pupils are equal, round, and reactive to light.  Neck: Normal range of motion and phonation normal. Neck supple.  Cardiovascular: Normal rate and regular rhythm.   Pulmonary/Chest: Effort normal and breath sounds normal. She exhibits no tenderness.  Abdominal: Soft. She exhibits no distension. There is no tenderness. There is no guarding.  Musculoskeletal: Normal range of motion.  Tenderness to left lower extremity Tenderness and mild swelling to lateral left ankle No deformity of bilateral lower extremities Abrasions of right lateral elbow with mild ecchymosis  Neurological: She is alert and oriented to person, place, and time. She exhibits normal muscle tone.  Sensation intact No focal weakness of arms or legs Decreased movement left leg secondary to pain in left ankle  Skin: Skin is warm and dry.  Psychiatric: She has a normal mood and affect. Her behavior is normal. Judgment and thought content normal.  Nursing note and vitals reviewed.    ED Treatments / Results    DIAGNOSTIC STUDIES: Oxygen Saturation is 96% on RA, adequate by my interpretation.  COORDINATION OF CARE:  8:49 AM Will order imaging of left lower extremity and CT of head. Discussed treatment plan with pt at bedside and pt agreed to plan. Pt will also receive Tdap.  Labs (all labs ordered are listed, but only abnormal results are displayed) Labs Reviewed  BASIC METABOLIC PANEL - Abnormal; Notable for the following:       Result Value   Glucose, Bld 131 (*)    BUN 28 (*)    Creatinine, Ser 1.53 (*)    GFR calc non Af Amer 31 (*)    GFR calc Af Amer 36 (*)    All other components within normal limits  CBC WITH DIFFERENTIAL/PLATELET - Abnormal; Notable for the following:    RBC 3.70 (*)    Hemoglobin 11.4 (*)  HCT 35.2 (*)    All other components within normal limits  URINE CULTURE  URINALYSIS, ROUTINE W REFLEX MICROSCOPIC (NOT AT [facility])    EKG  EKG Interpretation  Date/Time:  Tuesday October 14 2015 08:30:05 EDT Ventricular Rate:  80 PR Interval:    QRS Duration: 80 QT Interval:  391 QTC Calculation: 451 R Axis:   63 Text Interpretation:  Sinus rhythm Borderline prolonged PR interval Abnormal R-wave progression, early transition since last tracing no significant change Confirmed by Eulis Foster  MD, Uva Runkel [phone]) on 10/14/2015 10:45:39 AM       Radiology Dg Chest 2 View  Result Date: 10/14/2015 CLINICAL DATA:  Dizziness for 2 days. EXAM: CHEST  2 VIEW COMPARISON:  Move FINDINGS: AP upright radiograph demonstrates mild cardiac enlargement. No active infiltrates or failure. No effusion or pneumothorax. Osteopenia with degenerative disc disease is suggested. IMPRESSION: No active cardiopulmonary disease. Electronically Signed   By: Staci Righter M.D.   On: 10/14/2015 09:58   Dg Tibia/fibula Left  Result Date: 10/14/2015 CLINICAL DATA:  Pain following fall EXAM: LEFT TIBIA AND FIBULA - 2 VIEW COMPARISON:  None. FINDINGS: Frontal lateral views were obtained. On the lateral view,  there is apparent cortical disruption along the posterior distal fibula. On the frontal view, there is a subtle lucency in the distal fibula which is likely due to nondisplaced fracture. No other evidence of fracture. Note dislocation. There is moderate osteoarthritic change in the knee and ankle joints. No erosive change. IMPRESSION: Suspect fracture of distal fibula. Advise dedicated ankle images to better assess this area. Generalized osteoarthritic change in the knee and ankle joints. No dislocation appreciable. Electronically Signed   By: Lowella Grip III M.D.   On: 10/14/2015 10:02   Dg Ankle Complete Right  Result Date: 10/14/2015 CLINICAL DATA:  Dizziness for 2 days. RIGHT ankle pain. Recent fall. EXAM: RIGHT ANKLE - COMPLETE 3+ VIEW COMPARISON:  None. FINDINGS: No fracture or dislocation. Minor degenerative change inferior aspect medial malleolus. Plantar and Achilles spurs. Midfoot degenerative change incidentally noted. IMPRESSION: Negative for acute fracture Electronically Signed   By: Staci Righter M.D.   On: 10/14/2015 10:01   Ct Head Wo Contrast  Result Date: 10/14/2015 CLINICAL DATA:  Dizziness for 2 days.  Fall 1 day prior EXAM: CT HEAD WITHOUT CONTRAST TECHNIQUE: Contiguous axial images were obtained from the base of the skull through the vertex without intravenous contrast. COMPARISON:  None. FINDINGS: Brain: There is mild diffuse atrophy. There is no intracranial mass, hemorrhage, extra-axial fluid collection, or midline shift. There is fairly mild patchy small vessel disease in the centra semiovale bilaterally. There is evidence of a prior small lacunar infarct in the periphery of the mid left cerebellum posteriorly. Elsewhere gray-white compartments appear normal. No acute infarct is evident on this study. Vascular: No hyperdense vessel is seen. There is calcification in the carotid siphon regions bilaterally. Skull:  Bony calvarium appears intact. Sinuses/Orbits: There is extensive  opacification in the right maxillary antrum. Other paranasal sinuses are clear. Orbits appear symmetric bilaterally. Other:  Mastoid air cells are clear. IMPRESSION: Atrophy with patchy periventricular small vessel disease. No intracranial mass, hemorrhage, or extra-axial fluid collection. No acute infarct. Extensive right maxillary sinus disease. Carotid siphon calcification bilaterally. Electronically Signed   By: Lowella Grip III M.D.   On: 10/14/2015 09:36   Dg Foot Complete Left  Result Date: 10/14/2015 CLINICAL DATA:  Left ankle pain after fall last night. EXAM: LEFT FOOT - COMPLETE 3+ VIEW COMPARISON:  None. FINDINGS: Possible nondisplaced fracture involving the fifth proximal phalanx. This appears to be closed and posttraumatic. There is no evidence of arthropathy. Mild spurring of posterior calcaneus is noted. Soft tissues are unremarkable. IMPRESSION: Possible nondisplaced fifth proximal phalangeal fracture. Electronically Signed   By: Marijo Conception, M.D.   On: 10/14/2015 10:08    Radiologic imaging report reviewed and images by CT and radiography  - viewed, by me.  Procedures Procedures (including critical care time)  Medications Ordered in ED Medications  Tdap (BOOSTRIX) injection 0.5 mL (0.5 mLs Intramuscular Given 10/14/15 0907)     Initial Impression / Assessment and Plan / ED Course  I have reviewed the triage vital signs and the nursing notes.  Pertinent labs & imaging results that were available during my care of the patient were reviewed by me and considered in my medical decision making (see chart for details).  Clinical Course    Medications  Tdap (BOOSTRIX) injection 0.5 mL (0.5 mLs Intramuscular Given 10/14/15 0907)    Patient Vitals for the past 24 hrs:  BP Temp Temp src Pulse Resp SpO2 Height Weight  10/14/15 1217 194/69 98 F (36.7 C) Oral 82 20 95 % - -  10/14/15 0900 159/86 - - 77 26 93 % - -  10/14/15 0830 136/92 97.8 F (36.6 C) Oral 78 26 96 % 5'  5" (1.651 m) 204 lb (92.5 kg)    12:26 PM Reevaluation with update and discussion. After initial assessment and treatment, an updated evaluation reveals She states that she is comfortable now. Findings discussed with patient and son, all questions answered. Magin Balbi L   Cam Walker applied, left ankle, by nursing, ambulation trial; Able to walk, but required assistance. Family members are here and state that they can help her in that she has a walker at home to use to help her walk.  Final Clinical Impressions(s) / ED Diagnoses   Final diagnoses:  Fall, initial encounter  Left fibular fracture, closed, initial encounter  Dizziness    Nonspecific dizziness, and fall. Isolated injury. Left ankle fracture. Doubt CVA, serious bacterial infection. Metabolic instability or impending vascular collapse.  Nursing Notes Reviewed/ Care Coordinated Applicable Imaging Reviewed Interpretation of Laboratory Data incorporated into ED treatment  The patient appears reasonably screened and/or stabilized for discharge and I doubt any other medical condition or other [facility] requiring further screening, evaluation, or treatment in the ED at this time prior to discharge.  Plan: Home Medications- continue, Tylenol for pain; Home Treatments- CAM walker when up, in home health evaluation, by home health with PT and aide, ordered; return here if the recommended treatment, does not improve the symptoms; Recommended follow up- PCP 1 week, Ortho prn     I personally performed the services described in this documentation, which was scribed in my presence. The recorded information has been reviewed and is accurate.  New Prescriptions New Prescriptions   TRAMADOL-ACETAMINOPHEN (ULTRACET) 37.5-325 MG TABLET    Take 1 tablet by mouth every 6 (six) hours as needed for moderate pain.     Daleen Bo, MD 10/14/15 [place], MD 10/14/15 [phone]

## 2015-10-14 NOTE — ED Notes (Signed)
Skin tear to right elbow cleaned and dry dressing applied

## 2015-10-14 NOTE — Discharge Instructions (Signed)
Wear the splint on your left ankle, whenever you get up.  Try to elevate your left foot above your heart. Several times each day.  Use a walker, when you get up to walk.

## 2015-10-14 NOTE — Care Management Note (Signed)
Case Management Note  Patient Details  Name: Marie House MRN: [id number] Date of Birth: 11/09/1934  Subjective/Objective: Patient in ER with c/o fall, ankle inury. CAM Walker applied in ER. Home health ordered by EDP. Patient offered choice.                    Action/Plan: Patient to DC home with home health services, aide and PT. Romualdo Bolk of [facility] notified and will obtain information from chart. Patient made aware by bedside RN that [facility] has 48 hours to make first visit.    Expected Discharge Date:                  Expected Discharge Plan:  [place]  In-House Referral:  NA  Discharge planning Services  CM Consult  Post Acute Care Choice:  Home Health Choice offered to:  Patient  DME Arranged:    DME Agency:     HH Arranged:  PT, Nurse's Aide [place] Agency:  [place]  Status of Service:  Completed, signed off  If discussed at [place] of Stay Meetings, dates discussed:    Additional Comments:  Koryn Charlot, Chauncey [place], RN 10/14/2015, 12:48 PM

## 2015-10-14 NOTE — ED Triage Notes (Signed)
EMS reports pt has been feeling dizzy since Sunday.  Reports she fell last night and c/o pain to left ankle and left hip.  No loss of consciousness.  Pt presently alert and oriented.

## 2015-10-14 NOTE — ED Notes (Signed)
Returned from CT and Xray.

## 2015-10-14 NOTE — ED Notes (Signed)
Pt unable to urinate on bedpan at this time. Will attempt when returns from xray and CT

## 2015-10-15 DIAGNOSIS — Z9181 History of falling: Secondary | ICD-10-CM | POA: Diagnosis not present

## 2015-10-15 DIAGNOSIS — I10 Essential (primary) hypertension: Secondary | ICD-10-CM | POA: Diagnosis not present

## 2015-10-15 DIAGNOSIS — Z7982 Long term (current) use of aspirin: Secondary | ICD-10-CM | POA: Diagnosis not present

## 2015-10-15 DIAGNOSIS — Z794 Long term (current) use of insulin: Secondary | ICD-10-CM | POA: Diagnosis not present

## 2015-10-15 DIAGNOSIS — S82402D Unspecified fracture of shaft of left fibula, subsequent encounter for closed fracture with routine healing: Secondary | ICD-10-CM | POA: Diagnosis not present

## 2015-10-15 DIAGNOSIS — E119 Type 2 diabetes mellitus without complications: Secondary | ICD-10-CM | POA: Diagnosis not present

## 2015-10-15 LAB — URINE CULTURE: Culture: NO GROWTH

## 2015-10-21 ENCOUNTER — Ambulatory Visit (INDEPENDENT_AMBULATORY_CARE_PROVIDER_SITE_OTHER): Payer: Medicare Other | Admitting: Orthopaedic Surgery

## 2015-10-21 ENCOUNTER — Encounter: Payer: Self-pay | Admitting: Orthopaedic Surgery

## 2015-10-21 VITALS — BP 145/67 | HR 91 | Temp 97.7°F | Ht 63.0 in | Wt 214.0 lb

## 2015-10-21 DIAGNOSIS — S92912A Unspecified fracture of left toe(s), initial encounter for closed fracture: Secondary | ICD-10-CM

## 2015-10-21 DIAGNOSIS — S8262XA Displaced fracture of lateral malleolus of left fibula, initial encounter for closed fracture: Secondary | ICD-10-CM | POA: Diagnosis not present

## 2015-10-21 DIAGNOSIS — Z7982 Long term (current) use of aspirin: Secondary | ICD-10-CM | POA: Diagnosis not present

## 2015-10-21 DIAGNOSIS — Z9181 History of falling: Secondary | ICD-10-CM | POA: Diagnosis not present

## 2015-10-21 DIAGNOSIS — E119 Type 2 diabetes mellitus without complications: Secondary | ICD-10-CM | POA: Diagnosis not present

## 2015-10-21 DIAGNOSIS — I10 Essential (primary) hypertension: Secondary | ICD-10-CM | POA: Diagnosis not present

## 2015-10-21 DIAGNOSIS — Z794 Long term (current) use of insulin: Secondary | ICD-10-CM | POA: Diagnosis not present

## 2015-10-21 DIAGNOSIS — S82402D Unspecified fracture of shaft of left fibula, subsequent encounter for closed fracture with routine healing: Secondary | ICD-10-CM | POA: Diagnosis not present

## 2015-10-21 MED ORDER — TRAMADOL HCL 50 MG PO TABS: 50.0000 mg | ORAL_TABLET | Freq: Four times a day (QID) | ORAL | 3 refills | Status: DC | PRN

## 2015-10-21 NOTE — Progress Notes (Signed)
Patient [id number] Marie House, female DOB:1934-11-03, 81 y.o. [id number]  Chief Complaint  Patient presents with  . Fracture    distal fibula fracture, DOI 10/14/15    HPI  Marie House is a 81 y.o. female who fell at home on October 14, 2015 and was seen in the ER.  She had been slightly dizzy at home.  She uses a walker.  She fell and hurt her left foot, left ankle, left knee.  She was seen in the ER and evaluated.  She had multiple x-rays including CT scan of the head.  She was noted to have fracture of the lateral malleolus on the left and the proximal portion of the fifth toe on the left.  She has done well since then.  She has no other pains or problem.  She has no head injury or problem.  She is using her walker. She is in a CAM walker.  I have reviewed the ER report, multiple x-rays and multiple x-ray reports.  I have gone over them with her.  Her daughter accompanies her.  She says her mom has been getting dizzy on and off over the last several months.  Dr. Willey Blade has been following her for this.  She is also diabetic and that is well controlled as is her hypertension. HPI  Body mass index is 37.91 kg/m.  ROS  Review of Systems  HENT: Negative for congestion.   Respiratory: Positive for shortness of breath. Negative for cough.   Cardiovascular: Negative for chest pain and leg swelling.  Endocrine: Positive for cold intolerance.  Musculoskeletal: Positive for arthralgias, joint swelling and myalgias.  Allergic/Immunologic: Positive for environmental allergies.  Neurological: Positive for dizziness, syncope and light-headedness.    Past Medical History:  Diagnosis Date  . Arthritis   . Carotid artery disease ([place])   . CKD (chronic kidney disease) stage 3, GFR 30-59 ml/min   . Coronary atherosclerosis of native coronary artery    Cypher DES LAD 2003, Taxus DES LAD 2004  . Essential hypertension, benign   . Mixed hyperlipidemia   . Type 2 diabetes mellitus ([place])     Past  Surgical History:  Procedure Laterality Date  . CAROTID ENDARTERECTOMY Right 2004  . CATARACT EXTRACTION    . CHOLECYSTECTOMY    . MASTECTOMY Left   . PARTIAL HYSTERECTOMY    . TONSILLECTOMY      Family History  Problem Relation Age of Onset  . CAD Mother     Social History Social History  Substance Use Topics  . Smoking status: Former Smoker    Types: Cigarettes    Quit date: 03/15/2006  . Smokeless tobacco: Never Used  . Alcohol use No    Allergies  Allergen Reactions  . Altace [Ramipril]   . Atacand [Candesartan]   . Avandia [Rosiglitazone]   . Crestor [Rosuvastatin]   . Diovan [Valsartan]   . Lisinopril   . Lotensin [Benazepril]   . Norvasc [Amlodipine Besylate]   . Zetia [Ezetimibe]     Current Outpatient Prescriptions  Medication Sig Dispense Refill  . ALPRAZolam (XANAX) 0.5 MG tablet Take 0.5 mg by mouth 3 (three) times daily as needed for anxiety.    Marland Kitchen aspirin EC 81 MG tablet Take 81 mg by mouth daily.    . carvedilol (COREG) 12.5 MG tablet Take 12.5 mg by mouth 2 (two) times daily with a meal.     . enalapril (VASOTEC) 5 MG tablet Take 5 mg by mouth daily.    Marland Kitchen  hydrochlorothiazide (HYDRODIURIL) 25 MG tablet Take 25 mg by mouth daily.    . insulin aspart (NOVOLOG) 100 UNIT/ML injection Inject 10 Units into the skin 2 (two) times daily.     . insulin glargine (LANTUS) 100 UNIT/ML injection Inject 40 Units into the skin 2 (two) times daily.     . isosorbide mononitrate (IMDUR) 30 MG 24 hr tablet Take 15 mg by mouth at bedtime. Take 1/2 tablet at night 15 mg starting 08/14/15     . pravastatin (PRAVACHOL) 80 MG tablet Take 80 mg by mouth daily.    . traMADol (ULTRAM) 50 MG tablet Take 1 tablet (50 mg total) by mouth every 6 (six) hours as needed. 60 tablet 3   No current facility-administered medications for this visit.      Physical Exam  Blood pressure (!) 145/67, pulse 91, temperature 97.7 F (36.5 C), height 5\' 3"  (1.6 m), weight 214 lb (97.1  kg).  Constitutional: overall normal hygiene, normal nutrition, well developed, normal grooming, normal body habitus. Assistive device:walker  Musculoskeletal: gait and station Limp left, muscle tone and strength are normal, no tremors or atrophy is present.  .  Neurological: coordination overall normal.  Deep tendon reflex/nerve stretch intact.  Sensation normal.  Cranial nerves II-XII intact.   Skin:   normal overall no scars, lesions, ulcers or rashes. No psoriasis.  Psychiatric: Alert and oriented x 3.  Recent memory intact, remote memory unclear.  Normal mood and affect. Well groomed.  Good eye contact.  Cardiovascular: overall no swelling, no varicosities, no edema bilaterally, normal temperatures of the legs and arms, no clubbing, cyanosis and good capillary refill.  Lymphatic: palpation is normal.  The left ankle has some swelling and pain over the lateral malleolus. ROM is good.  There is slight ecchymosis of the fifth toe and the dorsum of the left foot with some swelling here.  NV is intact.  ROM of the left ankle is good.  The patient has been educated about the nature of the problem(s) and counseled on treatment options.  The patient appeared to understand what I have discussed and is in agreement with it.  Encounter Diagnoses  Name Primary?  . Closed fracture of left lateral malleolus, initial encounter Yes  . Fracture, toe, left, closed, initial encounter     PLAN Call if any problems.  Precautions discussed.  Continue current medications.   Return to clinic 2 weeks   Continue CAM walker.  Contrast bath instructions given.  X-rays on return of the left ankle and the left foot.  Electronically Signed Sanjuana Kava, MD 8/8/201711:18 AM

## 2015-10-21 NOTE — Patient Instructions (Addendum)
Precautions discussed.  Continue current medications.  Contrast bath information given and explained.

## 2015-11-06 ENCOUNTER — Ambulatory Visit (INDEPENDENT_AMBULATORY_CARE_PROVIDER_SITE_OTHER): Payer: Medicare Other

## 2015-11-06 ENCOUNTER — Encounter: Payer: Self-pay | Admitting: Orthopaedic Surgery

## 2015-11-06 ENCOUNTER — Ambulatory Visit (INDEPENDENT_AMBULATORY_CARE_PROVIDER_SITE_OTHER): Payer: Medicare Other | Admitting: Orthopaedic Surgery

## 2015-11-06 VITALS — BP 153/84 | HR 83 | Temp 97.0°F | Ht 63.0 in | Wt 214.0 lb

## 2015-11-06 DIAGNOSIS — S92902D Unspecified fracture of left foot, subsequent encounter for fracture with routine healing: Secondary | ICD-10-CM

## 2015-11-06 DIAGNOSIS — S8262XD Displaced fracture of lateral malleolus of left fibula, subsequent encounter for closed fracture with routine healing: Secondary | ICD-10-CM | POA: Diagnosis not present

## 2015-11-06 NOTE — Progress Notes (Signed)
CC:  My boot is heavy  She does not like the CAM walker.  She is using it and a walker.  She has little pain.  NV intact. ROM of ankle left full.  X-rays reported separately.  Encounter Diagnoses  Name Primary?  . Closed low lateral malleolus fracture, left, with routine healing, subsequent encounter Yes  . Foot fracture, left, with routine healing, subsequent encounter    She can come out of the boot at home.  Return in three weeks.  Call if any problem.  X-rays on return.  Precautions discussed.  Electronically Signed Sanjuana Kava, MD 8/24/20179:22 AM

## 2015-12-02 ENCOUNTER — Ambulatory Visit (INDEPENDENT_AMBULATORY_CARE_PROVIDER_SITE_OTHER): Payer: Medicare Other | Admitting: Orthopaedic Surgery

## 2015-12-02 ENCOUNTER — Ambulatory Visit (INDEPENDENT_AMBULATORY_CARE_PROVIDER_SITE_OTHER): Payer: Medicare Other

## 2015-12-02 VITALS — BP 175/86 | HR 89 | Ht 63.0 in | Wt 204.0 lb

## 2015-12-02 DIAGNOSIS — S8262XD Displaced fracture of lateral malleolus of left fibula, subsequent encounter for closed fracture with routine healing: Secondary | ICD-10-CM

## 2015-12-02 DIAGNOSIS — S92902D Unspecified fracture of left foot, subsequent encounter for fracture with routine healing: Secondary | ICD-10-CM

## 2015-12-02 NOTE — Progress Notes (Signed)
CC:  My ankle is much better  She has been using the CAM walker and having no problem. She wants to come out of it.  I told her she can wean herself out of it.  NV intact.   ROM full  X-rays were done of the left ankle and left foot, reported separately.  Encounter Diagnoses  Name Primary?  . Fracture of left foot with routine healing Yes  . Closed displaced fracture of lateral malleolus of left fibula with routine healing    Come out of CAM walker.  Return in one month.  X-rays of left ankle then.  Call if any problem  Precautions discussed.  Electronically Signed Sanjuana Kava, MD 9/19/20173:35 PM

## 2015-12-11 DIAGNOSIS — H353211 Exudative age-related macular degeneration, right eye, with active choroidal neovascularization: Secondary | ICD-10-CM | POA: Diagnosis not present

## 2015-12-11 DIAGNOSIS — H43812 Vitreous degeneration, left eye: Secondary | ICD-10-CM | POA: Diagnosis not present

## 2015-12-11 DIAGNOSIS — E113393 Type 2 diabetes mellitus with moderate nonproliferative diabetic retinopathy without macular edema, bilateral: Secondary | ICD-10-CM | POA: Diagnosis not present

## 2015-12-11 DIAGNOSIS — H353223 Exudative age-related macular degeneration, left eye, with inactive scar: Secondary | ICD-10-CM | POA: Diagnosis not present

## 2015-12-22 DIAGNOSIS — I10 Essential (primary) hypertension: Secondary | ICD-10-CM | POA: Diagnosis not present

## 2015-12-22 DIAGNOSIS — E119 Type 2 diabetes mellitus without complications: Secondary | ICD-10-CM | POA: Diagnosis not present

## 2015-12-22 DIAGNOSIS — Z79899 Other long term (current) drug therapy: Secondary | ICD-10-CM | POA: Diagnosis not present

## 2015-12-22 DIAGNOSIS — E785 Hyperlipidemia, unspecified: Secondary | ICD-10-CM | POA: Diagnosis not present

## 2015-12-22 DIAGNOSIS — N181 Chronic kidney disease, stage 1: Secondary | ICD-10-CM | POA: Diagnosis not present

## 2015-12-24 ENCOUNTER — Ambulatory Visit (INDEPENDENT_AMBULATORY_CARE_PROVIDER_SITE_OTHER): Payer: Medicare Other | Admitting: Orthopaedic Surgery

## 2015-12-24 ENCOUNTER — Ambulatory Visit (INDEPENDENT_AMBULATORY_CARE_PROVIDER_SITE_OTHER): Payer: Medicare Other

## 2015-12-24 DIAGNOSIS — S82892D Other fracture of left lower leg, subsequent encounter for closed fracture with routine healing: Secondary | ICD-10-CM | POA: Diagnosis not present

## 2015-12-24 DIAGNOSIS — M25561 Pain in right knee: Secondary | ICD-10-CM | POA: Diagnosis not present

## 2015-12-24 DIAGNOSIS — M25562 Pain in left knee: Secondary | ICD-10-CM

## 2015-12-24 DIAGNOSIS — G8929 Other chronic pain: Secondary | ICD-10-CM | POA: Diagnosis not present

## 2015-12-24 MED ORDER — TRAMADOL HCL 50 MG PO TABS: 50.0000 mg | ORAL_TABLET | Freq: Four times a day (QID) | ORAL | 3 refills | Status: DC | PRN

## 2015-12-24 NOTE — Progress Notes (Signed)
CC: Both of my knees are hurting. I would like an injection in both knees.  The patient has had chronic pain and tenderness of both knees for some time.  Injections help.  There is no locking or giving way of the knee.  There is no new trauma. There is no redness or signs of infections.  The knees have a mild effusion and some crepitus.  There is no redness or signs of recent trauma.  Right knee ROM is 0-105 and left knee ROM is 0-100.  Impression:  Chronic pain of the both knees  Return:  6 weeks  PROCEDURE NOTE:  The patient requests injections of both knees, verbal consent was obtained.  The left and right knee were individually prepped appropriately after time out was performed.   Sterile technique was observed and injection of 1 cc of Depo-Medrol 40 mg with several cc's of plain xylocaine. Anesthesia was provided by ethyl chloride and a 20-gauge needle was used to inject each knee area. The injections were tolerated well.  A band aid dressing was applied.  The patient was advised to apply ice later today and tomorrow to the injection sight as needed.  She also has a healing fracture of the left lateral malleolus.  X-rays show the avulsion fracture has healed.  X-rays of the left ankle done, reported separately.  Encounter Diagnoses  Name Primary?  . Closed fracture of left ankle with routine healing, subsequent encounter Yes  . Chronic pain of right knee   . Chronic pain of left knee    Electronically Signed Sanjuana Kava, MD 10/11/201710:24 AM

## 2015-12-29 DIAGNOSIS — E1122 Type 2 diabetes mellitus with diabetic chronic kidney disease: Secondary | ICD-10-CM | POA: Diagnosis not present

## 2015-12-29 DIAGNOSIS — E785 Hyperlipidemia, unspecified: Secondary | ICD-10-CM | POA: Diagnosis not present

## 2015-12-29 DIAGNOSIS — N184 Chronic kidney disease, stage 4 (severe): Secondary | ICD-10-CM | POA: Diagnosis not present

## 2015-12-29 DIAGNOSIS — Z23 Encounter for immunization: Secondary | ICD-10-CM | POA: Diagnosis not present

## 2015-12-30 ENCOUNTER — Ambulatory Visit: Payer: Medicare Other | Admitting: Orthopaedic Surgery

## 2016-01-15 DIAGNOSIS — R195 Other fecal abnormalities: Secondary | ICD-10-CM | POA: Diagnosis not present

## 2016-01-15 DIAGNOSIS — K921 Melena: Secondary | ICD-10-CM | POA: Diagnosis not present

## 2016-01-27 ENCOUNTER — Encounter (INDEPENDENT_AMBULATORY_CARE_PROVIDER_SITE_OTHER): Payer: Self-pay | Admitting: Internal Medicine

## 2016-02-04 ENCOUNTER — Ambulatory Visit (INDEPENDENT_AMBULATORY_CARE_PROVIDER_SITE_OTHER): Payer: Medicare Other | Admitting: Internal Medicine

## 2016-02-04 ENCOUNTER — Encounter (INDEPENDENT_AMBULATORY_CARE_PROVIDER_SITE_OTHER): Payer: Self-pay

## 2016-02-04 ENCOUNTER — Encounter (INDEPENDENT_AMBULATORY_CARE_PROVIDER_SITE_OTHER): Payer: Self-pay | Admitting: Internal Medicine

## 2016-02-04 VITALS — BP 140/70 | HR 72 | Temp 98.2°F | Ht 64.0 in | Wt 206.6 lb

## 2016-02-04 DIAGNOSIS — R195 Other fecal abnormalities: Secondary | ICD-10-CM | POA: Diagnosis not present

## 2016-02-04 LAB — CBC
HEMATOCRIT: 36.5 % (ref 35.0–45.0)
Hemoglobin: 11.9 g/dL (ref 11.7–15.5)
MCH: 29.8 pg (ref 27.0–33.0)
MCHC: 32.6 g/dL (ref 32.0–36.0)
MCV: 91.3 fL (ref 80.0–100.0)
MPV: 10.7 fL (ref 7.5–12.5)
PLATELETS: 243 10*3/uL (ref 140–400)
RBC: 4 MIL/uL (ref 3.80–5.10)
RDW: 14.1 % (ref 11.0–15.0)
WBC: 8.3 10*3/uL (ref 3.8–10.8)

## 2016-02-04 NOTE — Patient Instructions (Signed)
3 stool cards home with patient.  CBC today,.

## 2016-02-04 NOTE — Progress Notes (Signed)
Subjective:    Patient ID: Marie House, female    DOB: 1934-03-25, 81 y.o.   MRN: [id number]  HPIReferred by Dr. Willey Blade for melena. She tells me her stools are dark. If she takes a laxative, her stools are normal colored. If she doesn't take a laxative, her stools are dark. She says her have not been black. They have been dark brown. There has been no change in her stools.  She occasionally takes [employer], but known recently. She denies any acid reflux. Take Prilosec OTC for GERD. There is no abdominal pain.  She has a BM twice a day.  Stool was negative at Dr. Ria Comment office.   01/15/2016 H and H 11.5 and 36.2 Her last colonoscopy was 13 yrs ago by Dr.Rourk and daughter in law reports it was normal.    Review of Systems     Past Medical History:  Diagnosis Date  . Arthritis   . Carotid artery disease ([place])   . CKD (chronic kidney disease) stage 3, GFR 30-59 ml/min   . Coronary atherosclerosis of native coronary artery    Cypher DES LAD 2003, Taxus DES LAD 2004  . Essential hypertension, benign   . Mixed hyperlipidemia   . Type 2 diabetes mellitus ([place])     Past Surgical History:  Procedure Laterality Date  . CAROTID ENDARTERECTOMY Right 2004  . CATARACT EXTRACTION    . CHOLECYSTECTOMY    . Left breast mastectomy     25 yrs ago  . MASTECTOMY Left   . PARTIAL HYSTERECTOMY    . TONSILLECTOMY      Allergies  Allergen Reactions  . Altace [Ramipril]   . Atacand [Candesartan]   . Avandia [Rosiglitazone]   . Crestor [Rosuvastatin]   . Diovan [Valsartan]   . Lisinopril   . Lotensin [Benazepril]   . Norvasc [Amlodipine Besylate]   . Zetia [Ezetimibe]     Current Outpatient Prescriptions on File Prior to Visit  Medication Sig Dispense Refill  . ALPRAZolam (XANAX) 0.5 MG tablet Take 0.5 mg by mouth 3 (three) times daily as needed for anxiety.    Marland Kitchen aspirin EC 81 MG tablet Take 81 mg by mouth daily.    . carvedilol (COREG) 12.5 MG tablet Take 12.5 mg by mouth 2  (two) times daily with a meal.     . enalapril (VASOTEC) 5 MG tablet Take 5 mg by mouth daily.    . hydrochlorothiazide (HYDRODIURIL) 25 MG tablet Take 25 mg by mouth daily.    . insulin aspart (NOVOLOG) 100 UNIT/ML injection Inject 20 Units into the skin 2 (two) times daily.     . insulin glargine (LANTUS) 100 UNIT/ML injection Inject 40 Units into the skin 2 (two) times daily.     . pravastatin (PRAVACHOL) 80 MG tablet Take 80 mg by mouth daily.    . traMADol (ULTRAM) 50 MG tablet Take 1 tablet (50 mg total) by mouth every 6 (six) hours as needed. 60 tablet 3   No current facility-administered medications on file prior to visit.     Objective:   Physical Exam Blood pressure 140/70, pulse 72, temperature 98.2 F (36.8 C), height 5\' 4"  (1.626 m), weight 206 lb 9.6 oz (93.7 kg). Alert and oriented. Skin warm and dry. Oral mucosa is moist.   . Sclera anicteric, conjunctivae is pink. Thyroid not enlarged. No cervical lymphadenopathy. Lungs clear. Heart regular rate and rhythm.  Abdomen is soft. Bowel sounds are positive. No hepatomegaly. No abdominal masses  felt. No tenderness.  No edema to lower extremities.  Stool brown and guaiac negative        Assessment & Plan:  Dark stools. Stool brown guaiac negative here.  Am going to get a CBC today. Last CBC 1st of November was normal. Stool cards x 3 sent home with patient. Obtain specimen when your stool is dark.

## 2016-02-09 ENCOUNTER — Encounter (INDEPENDENT_AMBULATORY_CARE_PROVIDER_SITE_OTHER): Payer: Self-pay | Admitting: *Deleted

## 2016-02-09 ENCOUNTER — Other Ambulatory Visit (INDEPENDENT_AMBULATORY_CARE_PROVIDER_SITE_OTHER): Payer: Self-pay | Admitting: *Deleted

## 2016-02-09 ENCOUNTER — Telehealth (INDEPENDENT_AMBULATORY_CARE_PROVIDER_SITE_OTHER): Payer: Self-pay | Admitting: *Deleted

## 2016-02-09 DIAGNOSIS — R195 Other fecal abnormalities: Secondary | ICD-10-CM

## 2016-02-09 NOTE — Telephone Encounter (Signed)
Lab is noted for 03/09/2016. A letter has been sent as a reminder for the patient to have the lab drawn.

## 2016-02-09 NOTE — Telephone Encounter (Signed)
   Diagnosis:    Result(s)   Card 1: Negative:  -November 23-2017    Card 2:  Negative:-November 24-2017   Card 3: Negative:-November 25-2017    Completed by: Thomas Hoff, LPN   HEMOCCULT SENSA DEVELOPER: VQQ#:[id number] Marguarite Arbour DATE: 2020-05   HEMOCCULT SENSA CARD:  YWV#:[id number]   EXPIRATION DATE: 03/20   CARD CONTROL RESULTS:  POSITIVE: Positive  NEGATIVE: Negative    ADDITIONAL COMMENTS: Results were forwarded to [place] for review.

## 2016-02-09 NOTE — Telephone Encounter (Signed)
Results given to patient. All are negative. If she has another dark stool she will bring a specimen to our ofice. Does not want a colonoscopy. Last colonoscopy 13 yrs ago   Tammy, CBC in 4 weeks.

## 2016-02-12 ENCOUNTER — Ambulatory Visit (INDEPENDENT_AMBULATORY_CARE_PROVIDER_SITE_OTHER): Payer: Medicare Other | Admitting: Orthopaedic Surgery

## 2016-02-12 ENCOUNTER — Encounter: Payer: Self-pay | Admitting: Orthopaedic Surgery

## 2016-02-12 VITALS — BP 175/89 | HR 84 | Temp 97.1°F | Ht 62.0 in | Wt 206.0 lb

## 2016-02-12 DIAGNOSIS — G8929 Other chronic pain: Secondary | ICD-10-CM | POA: Diagnosis not present

## 2016-02-12 DIAGNOSIS — M25562 Pain in left knee: Secondary | ICD-10-CM

## 2016-02-12 DIAGNOSIS — M25561 Pain in right knee: Secondary | ICD-10-CM | POA: Diagnosis not present

## 2016-02-12 NOTE — Progress Notes (Signed)
CC: Both of my knees are hurting. I would like an injection in both knees.  The patient has had chronic pain and tenderness of both knees for some time.  Injections help.  There is no locking or giving way of the knee.  There is no new trauma. There is no redness or signs of infections.  The knees have a mild effusion and some crepitus.  There is no redness or signs of recent trauma.  Right knee ROM is 0-100 and left knee ROM is 0-105.  Impression:  Chronic pain of the both knees  Return:  6 weeks  PROCEDURE NOTE:  The patient requests injections of both knees, verbal consent was obtained.  The left and right knee were individually prepped appropriately after time out was performed.   Sterile technique was observed and injection of 1 cc of Depo-Medrol 40 mg with several cc's of plain xylocaine. Anesthesia was provided by ethyl chloride and a 20-gauge needle was used to inject each knee area. The injections were tolerated well.  A band aid dressing was applied.  The patient was advised to apply ice later today and tomorrow to the injection sight as needed.   Electronically Signed Sanjuana Kava, MD 11/30/20172:10 PM

## 2016-03-14 ENCOUNTER — Other Ambulatory Visit: Payer: Self-pay | Admitting: Cardiology

## 2016-03-25 ENCOUNTER — Ambulatory Visit: Payer: Medicare Other | Admitting: Orthopaedic Surgery

## 2016-03-30 ENCOUNTER — Ambulatory Visit (INDEPENDENT_AMBULATORY_CARE_PROVIDER_SITE_OTHER): Payer: PPO | Admitting: Orthopaedic Surgery

## 2016-03-30 VITALS — BP 152/67 | HR 86 | Temp 97.9°F | Ht 62.0 in | Wt 208.0 lb

## 2016-03-30 DIAGNOSIS — M25561 Pain in right knee: Secondary | ICD-10-CM

## 2016-03-30 DIAGNOSIS — G8929 Other chronic pain: Secondary | ICD-10-CM | POA: Diagnosis not present

## 2016-03-30 MED ORDER — TRAMADOL HCL 50 MG PO TABS: 50.0000 mg | ORAL_TABLET | Freq: Four times a day (QID) | ORAL | 3 refills | Status: DC | PRN

## 2016-03-30 NOTE — Progress Notes (Signed)
CC:  I have pain of my right knee. I would like an injection.  The patient has chronic pain of the right knee.  There is no recent trauma.  There is no redness.  Injections in the past have helped.  The knee has no redness, has an effusion and crepitus present.  ROM of the right knee is 0-100.  Impression:  Chronic knee pain right  Return: 2 months  PROCEDURE NOTE:  The patient requests injections of the right knee , verbal consent was obtained.  The right knee was prepped appropriately after time out was performed.   Sterile technique was observed and injection of 1 cc of Depo-Medrol 40 mg with several cc's of plain xylocaine. Anesthesia was provided by ethyl chloride and a 20-gauge needle was used to inject the knee area. The injection was tolerated well.  A band aid dressing was applied.  The patient was advised to apply ice later today and tomorrow to the injection sight as needed.  I also refilled her Tramadol after reviewing the state narcotic web site. Electronically Signed Sanjuana Kava, MD 1/16/20182:34 PM

## 2016-04-06 ENCOUNTER — Telehealth: Payer: Self-pay | Admitting: Orthopaedic Surgery

## 2016-04-06 MED ORDER — HYDROCODONE-ACETAMINOPHEN 5-325 MG PO TABS: ORAL_TABLET | ORAL | 0 refills | Status: DC

## 2016-04-06 NOTE — Telephone Encounter (Signed)
Patient's daughter called to say that Marie House did not know until today when she picked up the prescription that she can't take Tramadol. She is asking if you would prescribe Hydrocodone for pain.

## 2016-04-27 DIAGNOSIS — N181 Chronic kidney disease, stage 1: Secondary | ICD-10-CM | POA: Diagnosis not present

## 2016-04-27 DIAGNOSIS — I10 Essential (primary) hypertension: Secondary | ICD-10-CM | POA: Diagnosis not present

## 2016-04-27 DIAGNOSIS — E785 Hyperlipidemia, unspecified: Secondary | ICD-10-CM | POA: Diagnosis not present

## 2016-04-27 DIAGNOSIS — E119 Type 2 diabetes mellitus without complications: Secondary | ICD-10-CM | POA: Diagnosis not present

## 2016-04-27 DIAGNOSIS — Z79899 Other long term (current) drug therapy: Secondary | ICD-10-CM | POA: Diagnosis not present

## 2016-05-03 DIAGNOSIS — N184 Chronic kidney disease, stage 4 (severe): Secondary | ICD-10-CM | POA: Diagnosis not present

## 2016-05-03 DIAGNOSIS — I10 Essential (primary) hypertension: Secondary | ICD-10-CM | POA: Diagnosis not present

## 2016-05-03 DIAGNOSIS — E1122 Type 2 diabetes mellitus with diabetic chronic kidney disease: Secondary | ICD-10-CM | POA: Diagnosis not present

## 2016-05-03 DIAGNOSIS — Z6838 Body mass index (BMI) 38.0-38.9, adult: Secondary | ICD-10-CM | POA: Diagnosis not present

## 2016-05-06 ENCOUNTER — Ambulatory Visit (INDEPENDENT_AMBULATORY_CARE_PROVIDER_SITE_OTHER): Payer: Medicare Other | Admitting: Internal Medicine

## 2016-05-24 DIAGNOSIS — Z08 Encounter for follow-up examination after completed treatment for malignant neoplasm: Secondary | ICD-10-CM | POA: Diagnosis not present

## 2016-05-24 DIAGNOSIS — D040 Carcinoma in situ of skin of lip: Secondary | ICD-10-CM | POA: Diagnosis not present

## 2016-05-24 DIAGNOSIS — X32XXXD Exposure to sunlight, subsequent encounter: Secondary | ICD-10-CM | POA: Diagnosis not present

## 2016-05-24 DIAGNOSIS — Z85828 Personal history of other malignant neoplasm of skin: Secondary | ICD-10-CM | POA: Diagnosis not present

## 2016-05-24 DIAGNOSIS — L570 Actinic keratosis: Secondary | ICD-10-CM | POA: Diagnosis not present

## 2016-05-27 ENCOUNTER — Ambulatory Visit (INDEPENDENT_AMBULATORY_CARE_PROVIDER_SITE_OTHER): Payer: PPO | Admitting: Orthopaedic Surgery

## 2016-05-27 VITALS — BP 149/73 | HR 90 | Ht 62.0 in | Wt 206.0 lb

## 2016-05-27 DIAGNOSIS — M25561 Pain in right knee: Secondary | ICD-10-CM

## 2016-05-27 DIAGNOSIS — M25562 Pain in left knee: Secondary | ICD-10-CM

## 2016-05-27 DIAGNOSIS — G8929 Other chronic pain: Secondary | ICD-10-CM | POA: Diagnosis not present

## 2016-05-27 MED ORDER — HYDROCODONE-ACETAMINOPHEN 5-325 MG PO TABS: ORAL_TABLET | ORAL | 0 refills | Status: DC

## 2016-05-27 NOTE — Progress Notes (Signed)
CC: Both of my knees are hurting. I would like an injection in both knees.  The patient has had chronic pain and tenderness of both knees for some time.  Injections help.  There is no locking or giving way of the knee.  There is no new trauma. There is no redness or signs of infections.  The knees have a mild effusion and some crepitus.  There is no redness or signs of recent trauma.  Right knee ROM is 0-105 and left knee ROM is 0-100.  Impression:  Chronic pain of the both knees  Return:  2 months  PROCEDURE NOTE:  The patient requests injections of both knees, verbal consent was obtained.  The left and right knee were individually prepped appropriately after time out was performed.   Sterile technique was observed and injection of 1 cc of Depo-Medrol 40 mg with several cc's of plain xylocaine. Anesthesia was provided by ethyl chloride and a 20-gauge needle was used to inject each knee area. The injections were tolerated well.  A band aid dressing was applied.  The patient was advised to apply ice later today and tomorrow to the injection sight as needed.  I have reviewed the [place] web site prior to prescribing narcotic medicine for this patient.  Electronically Signed Sanjuana Kava, MD 3/15/20182:24 PM

## 2016-06-07 DIAGNOSIS — X32XXXD Exposure to sunlight, subsequent encounter: Secondary | ICD-10-CM | POA: Diagnosis not present

## 2016-06-07 DIAGNOSIS — L821 Other seborrheic keratosis: Secondary | ICD-10-CM | POA: Diagnosis not present

## 2016-06-07 DIAGNOSIS — D040 Carcinoma in situ of skin of lip: Secondary | ICD-10-CM | POA: Diagnosis not present

## 2016-06-07 DIAGNOSIS — L570 Actinic keratosis: Secondary | ICD-10-CM | POA: Diagnosis not present

## 2016-06-11 ENCOUNTER — Other Ambulatory Visit: Payer: Self-pay | Admitting: Cardiology

## 2016-06-21 DIAGNOSIS — M79675 Pain in left toe(s): Secondary | ICD-10-CM | POA: Diagnosis not present

## 2016-06-21 DIAGNOSIS — M79674 Pain in right toe(s): Secondary | ICD-10-CM | POA: Diagnosis not present

## 2016-06-21 DIAGNOSIS — L851 Acquired keratosis [keratoderma] palmaris et plantaris: Secondary | ICD-10-CM | POA: Diagnosis not present

## 2016-06-21 DIAGNOSIS — E1151 Type 2 diabetes mellitus with diabetic peripheral angiopathy without gangrene: Secondary | ICD-10-CM | POA: Diagnosis not present

## 2016-06-21 DIAGNOSIS — B351 Tinea unguium: Secondary | ICD-10-CM | POA: Diagnosis not present

## 2016-07-07 DIAGNOSIS — E1342 Other specified diabetes mellitus with diabetic polyneuropathy: Secondary | ICD-10-CM | POA: Diagnosis not present

## 2016-07-29 ENCOUNTER — Encounter: Payer: Self-pay | Admitting: Orthopaedic Surgery

## 2016-07-29 ENCOUNTER — Ambulatory Visit (INDEPENDENT_AMBULATORY_CARE_PROVIDER_SITE_OTHER): Payer: PPO | Admitting: Orthopaedic Surgery

## 2016-07-29 VITALS — BP 155/70 | HR 82 | Temp 97.2°F | Resp 18 | Ht 67.0 in | Wt 207.0 lb

## 2016-07-29 DIAGNOSIS — G8929 Other chronic pain: Secondary | ICD-10-CM

## 2016-07-29 DIAGNOSIS — M25561 Pain in right knee: Secondary | ICD-10-CM

## 2016-07-29 DIAGNOSIS — M25562 Pain in left knee: Secondary | ICD-10-CM | POA: Diagnosis not present

## 2016-07-29 NOTE — Progress Notes (Signed)
CC: Both of my knees are hurting. I would like an injection in both knees.  The patient has had chronic pain and tenderness of both knees for some time.  Injections help.  There is no locking or giving way of the knee.  There is no new trauma. There is no redness or signs of infections.  The knees have a mild effusion and some crepitus.  There is no redness or signs of recent trauma.  Right knee ROM is 0-105 and left knee ROM is 0-110.  Impression:  Chronic pain of the both knees  Return:  2 months  PROCEDURE NOTE:  The patient requests injections of both knees, verbal consent was obtained.  The left and right knee were individually prepped appropriately after time out was performed.   Sterile technique was observed and injection of 1 cc of Depo-Medrol 40 mg with several cc's of plain xylocaine. Anesthesia was provided by ethyl chloride and a 20-gauge needle was used to inject each knee area. The injections were tolerated well.  A band aid dressing was applied.  The patient was advised to apply ice later today and tomorrow to the injection sight as needed.  Electronically Signed Sanjuana Kava, MD 5/17/20181:37 PM

## 2016-08-16 DIAGNOSIS — H353211 Exudative age-related macular degeneration, right eye, with active choroidal neovascularization: Secondary | ICD-10-CM | POA: Diagnosis not present

## 2016-08-24 DIAGNOSIS — H353211 Exudative age-related macular degeneration, right eye, with active choroidal neovascularization: Secondary | ICD-10-CM | POA: Diagnosis not present

## 2016-08-24 DIAGNOSIS — H3561 Retinal hemorrhage, right eye: Secondary | ICD-10-CM | POA: Diagnosis not present

## 2016-08-24 DIAGNOSIS — H35361 Drusen (degenerative) of macula, right eye: Secondary | ICD-10-CM | POA: Diagnosis not present

## 2016-08-24 DIAGNOSIS — H353222 Exudative age-related macular degeneration, left eye, with inactive choroidal neovascularization: Secondary | ICD-10-CM | POA: Diagnosis not present

## 2016-08-26 DIAGNOSIS — I10 Essential (primary) hypertension: Secondary | ICD-10-CM | POA: Diagnosis not present

## 2016-08-26 DIAGNOSIS — Z79899 Other long term (current) drug therapy: Secondary | ICD-10-CM | POA: Diagnosis not present

## 2016-08-26 DIAGNOSIS — E119 Type 2 diabetes mellitus without complications: Secondary | ICD-10-CM | POA: Diagnosis not present

## 2016-08-26 DIAGNOSIS — E875 Hyperkalemia: Secondary | ICD-10-CM | POA: Diagnosis not present

## 2016-08-26 DIAGNOSIS — N184 Chronic kidney disease, stage 4 (severe): Secondary | ICD-10-CM | POA: Diagnosis not present

## 2016-08-30 DIAGNOSIS — I739 Peripheral vascular disease, unspecified: Secondary | ICD-10-CM | POA: Diagnosis not present

## 2016-08-30 DIAGNOSIS — M79674 Pain in right toe(s): Secondary | ICD-10-CM | POA: Diagnosis not present

## 2016-08-30 DIAGNOSIS — M79675 Pain in left toe(s): Secondary | ICD-10-CM | POA: Diagnosis not present

## 2016-08-30 DIAGNOSIS — L851 Acquired keratosis [keratoderma] palmaris et plantaris: Secondary | ICD-10-CM | POA: Diagnosis not present

## 2016-08-30 DIAGNOSIS — B351 Tinea unguium: Secondary | ICD-10-CM | POA: Diagnosis not present

## 2016-08-31 ENCOUNTER — Other Ambulatory Visit: Payer: Self-pay | Admitting: *Deleted

## 2016-08-31 NOTE — Patient Outreach (Signed)
HTA THN Screening call. Pt was not at home but I left a message with a family member who said she would tell the pt to return my call.  Marie House. [name], MSN, [facility] Gerontological Nurse Practitioner [facility] Care Management [phone]

## 2016-09-01 ENCOUNTER — Other Ambulatory Visit: Payer: Self-pay | Admitting: *Deleted

## 2016-09-01 ENCOUNTER — Encounter: Payer: Self-pay | Admitting: *Deleted

## 2016-09-01 NOTE — Patient Outreach (Signed)
HTA HRA Screen completed. Pt reports her glucose levels are running high and she is going to see her MD tomorrow. Pt admits to poor understanding of her diabetes. She has been to one class but didn't understand the information. She also has HTN and CAD. She did not want to discuss her medications with me.  I explained our services and encouraged her to consent to having a Curator. I told her your personal nurse will help you understand diabetes on a level that makes sense to you.  She did not consent today but said I could send her a letter about our services. I encouraged her to call me back if this is something she would be interested in.  I am sending Dr. Willey Blade this note so that he may encourage her to participate in our program.  Kayleen Memos C. [name], MSN, [facility] Gerontological Nurse Practitioner [facility] Care Management [phone]

## 2016-09-02 DIAGNOSIS — E875 Hyperkalemia: Secondary | ICD-10-CM | POA: Diagnosis not present

## 2016-09-02 DIAGNOSIS — E1122 Type 2 diabetes mellitus with diabetic chronic kidney disease: Secondary | ICD-10-CM | POA: Diagnosis not present

## 2016-09-02 DIAGNOSIS — N184 Chronic kidney disease, stage 4 (severe): Secondary | ICD-10-CM | POA: Diagnosis not present

## 2016-09-09 ENCOUNTER — Other Ambulatory Visit: Payer: Self-pay | Admitting: *Deleted

## 2016-09-09 NOTE — Patient Outreach (Signed)
HTA THN Screening. I received a call from Mrs. Wierman's daughter in law and she wanted to talk to me about [place] [facility] services. I was not able to reach her today but did leave a message and I will call again tomorrow.  Eulah Pont. [name], MSN, [facility] Gerontological Nurse Practitioner [facility] Care Management [phone]

## 2016-09-28 ENCOUNTER — Encounter: Payer: Self-pay | Admitting: Orthopaedic Surgery

## 2016-09-28 ENCOUNTER — Ambulatory Visit (INDEPENDENT_AMBULATORY_CARE_PROVIDER_SITE_OTHER): Payer: PPO | Admitting: Orthopaedic Surgery

## 2016-09-28 DIAGNOSIS — M25561 Pain in right knee: Secondary | ICD-10-CM | POA: Diagnosis not present

## 2016-09-28 DIAGNOSIS — M25562 Pain in left knee: Secondary | ICD-10-CM | POA: Diagnosis not present

## 2016-09-28 DIAGNOSIS — G8929 Other chronic pain: Secondary | ICD-10-CM | POA: Diagnosis not present

## 2016-09-28 NOTE — Progress Notes (Signed)
CC: Both of my knees are hurting. I would like an injection in both knees.  The patient has had chronic pain and tenderness of both knees for some time.  Injections help.  There is no locking or giving way of the knee.  There is no new trauma. There is no redness or signs of infections.  The knees have a mild effusion and some crepitus.  There is no redness or signs of recent trauma.  Right knee ROM is 0-110 and left knee ROM is 0-105.  Impression:  Chronic pain of the both knees  Return:  prn  PROCEDURE NOTE:  The patient requests injections of both knees, verbal consent was obtained.  The left and right knee were individually prepped appropriately after time out was performed.   Sterile technique was observed and injection of 1 cc of Depo-Medrol 40 mg with several cc's of plain xylocaine. Anesthesia was provided by ethyl chloride and a 20-gauge needle was used to inject each knee area. The injections were tolerated well.  A band aid dressing was applied.  The patient was advised to apply ice later today and tomorrow to the injection sight as needed.   Electronically Signed Sanjuana Kava, MD 7/17/20182:04 PM

## 2016-09-30 DIAGNOSIS — H353222 Exudative age-related macular degeneration, left eye, with inactive choroidal neovascularization: Secondary | ICD-10-CM | POA: Diagnosis not present

## 2016-09-30 DIAGNOSIS — H35361 Drusen (degenerative) of macula, right eye: Secondary | ICD-10-CM | POA: Diagnosis not present

## 2016-09-30 DIAGNOSIS — H353211 Exudative age-related macular degeneration, right eye, with active choroidal neovascularization: Secondary | ICD-10-CM | POA: Diagnosis not present

## 2016-11-04 DIAGNOSIS — H353211 Exudative age-related macular degeneration, right eye, with active choroidal neovascularization: Secondary | ICD-10-CM | POA: Diagnosis not present

## 2016-11-04 DIAGNOSIS — H35352 Cystoid macular degeneration, left eye: Secondary | ICD-10-CM | POA: Diagnosis not present

## 2016-11-04 DIAGNOSIS — H353113 Nonexudative age-related macular degeneration, right eye, advanced atrophic without subfoveal involvement: Secondary | ICD-10-CM | POA: Diagnosis not present

## 2016-11-04 DIAGNOSIS — H353221 Exudative age-related macular degeneration, left eye, with active choroidal neovascularization: Secondary | ICD-10-CM | POA: Diagnosis not present

## 2016-11-04 DIAGNOSIS — H3561 Retinal hemorrhage, right eye: Secondary | ICD-10-CM | POA: Diagnosis not present

## 2016-11-08 DIAGNOSIS — M79675 Pain in left toe(s): Secondary | ICD-10-CM | POA: Diagnosis not present

## 2016-11-08 DIAGNOSIS — E1151 Type 2 diabetes mellitus with diabetic peripheral angiopathy without gangrene: Secondary | ICD-10-CM | POA: Diagnosis not present

## 2016-11-08 DIAGNOSIS — M79674 Pain in right toe(s): Secondary | ICD-10-CM | POA: Diagnosis not present

## 2016-11-08 DIAGNOSIS — B351 Tinea unguium: Secondary | ICD-10-CM | POA: Diagnosis not present

## 2016-11-08 DIAGNOSIS — L851 Acquired keratosis [keratoderma] palmaris et plantaris: Secondary | ICD-10-CM | POA: Diagnosis not present

## 2016-11-08 DIAGNOSIS — I739 Peripheral vascular disease, unspecified: Secondary | ICD-10-CM | POA: Diagnosis not present

## 2016-11-11 DIAGNOSIS — H353221 Exudative age-related macular degeneration, left eye, with active choroidal neovascularization: Secondary | ICD-10-CM | POA: Diagnosis not present

## 2016-11-11 DIAGNOSIS — E113493 Type 2 diabetes mellitus with severe nonproliferative diabetic retinopathy without macular edema, bilateral: Secondary | ICD-10-CM | POA: Diagnosis not present

## 2016-11-11 DIAGNOSIS — H3561 Retinal hemorrhage, right eye: Secondary | ICD-10-CM | POA: Diagnosis not present

## 2016-11-11 DIAGNOSIS — H353211 Exudative age-related macular degeneration, right eye, with active choroidal neovascularization: Secondary | ICD-10-CM | POA: Diagnosis not present

## 2016-11-22 ENCOUNTER — Ambulatory Visit: Payer: PPO | Admitting: Cardiology

## 2016-11-30 DIAGNOSIS — E875 Hyperkalemia: Secondary | ICD-10-CM | POA: Diagnosis not present

## 2016-11-30 DIAGNOSIS — Z79899 Other long term (current) drug therapy: Secondary | ICD-10-CM | POA: Diagnosis not present

## 2016-11-30 DIAGNOSIS — N184 Chronic kidney disease, stage 4 (severe): Secondary | ICD-10-CM | POA: Diagnosis not present

## 2016-11-30 DIAGNOSIS — E1129 Type 2 diabetes mellitus with other diabetic kidney complication: Secondary | ICD-10-CM | POA: Diagnosis not present

## 2016-12-03 ENCOUNTER — Other Ambulatory Visit: Payer: Self-pay | Admitting: Cardiology

## 2016-12-03 ENCOUNTER — Ambulatory Visit (INDEPENDENT_AMBULATORY_CARE_PROVIDER_SITE_OTHER): Payer: PPO | Admitting: Cardiology

## 2016-12-03 ENCOUNTER — Encounter: Payer: Self-pay | Admitting: Cardiology

## 2016-12-03 VITALS — BP 138/66 | HR 84 | Ht 64.0 in | Wt 215.0 lb

## 2016-12-03 DIAGNOSIS — E782 Mixed hyperlipidemia: Secondary | ICD-10-CM | POA: Diagnosis not present

## 2016-12-03 DIAGNOSIS — N183 Chronic kidney disease, stage 3 unspecified: Secondary | ICD-10-CM

## 2016-12-03 DIAGNOSIS — I10 Essential (primary) hypertension: Secondary | ICD-10-CM | POA: Diagnosis not present

## 2016-12-03 DIAGNOSIS — I25119 Atherosclerotic heart disease of native coronary artery with unspecified angina pectoris: Secondary | ICD-10-CM

## 2016-12-03 MED ORDER — NITROGLYCERIN 0.4 MG SL SUBL: 0.4000 mg | SUBLINGUAL_TABLET | SUBLINGUAL | 3 refills | Status: DC | PRN

## 2016-12-03 NOTE — Progress Notes (Signed)
Cardiology Office Note  Date: 12/03/2016   ID: MEKAELA AZIZI, DOB Dec 13, 1934, MRN [id number]  PCP: Asencion Noble, MD  Primary Cardiologist: Rozann Lesches, MD   Chief Complaint  Patient presents with  . Coronary Artery Disease    History of Present Illness: Marie House is an 82 y.o. female last seen in June 2017. She is here today for a routine follow-up visit. She reports chronic dyspnea on exertion, seems to be very limited by bilateral arthritic knee pain and stiffness as well. She has occasional angina and uses sublingual nitroglycerin, although was not able to tolerate Imdur which we tried after the last visit  I reviewed her current medications. Cardiac regimen includes aspirin, Coreg, Vasotec, HCTZ, and Pravachol. We are sending in a refill for a fresh bottle of nitroglycerin.  I personally reviewed her ECG today which shows normal sinus rhythm. Plan is to continue medical therapy and observation. She has preferred to hold off on follow-up stress testing.  She states that she just recently had lab work with a pending visit per Dr. Willey Blade. Results are being requested.  Past Medical History:  Diagnosis Date  . Arthritis   . Carotid artery disease ([place])   . CKD (chronic kidney disease) stage 3, GFR 30-59 ml/min   . Coronary atherosclerosis of native coronary artery    Cypher DES LAD 2003, Taxus DES LAD 2004  . Essential hypertension, benign   . Mixed hyperlipidemia   . Type 2 diabetes mellitus ([place])     Past Surgical History:  Procedure Laterality Date  . CAROTID ENDARTERECTOMY Right 2004  . CATARACT EXTRACTION    . CHOLECYSTECTOMY    . Left breast mastectomy     25 yrs ago  . MASTECTOMY Left   . PARTIAL HYSTERECTOMY    . TONSILLECTOMY      Current Outpatient Prescriptions  Medication Sig Dispense Refill  . ALPRAZolam (XANAX) 0.5 MG tablet Take 0.5 mg by mouth 3 (three) times daily as needed for anxiety.    Marland Kitchen aspirin EC 81 MG tablet Take 81 mg by mouth daily.      . carvedilol (COREG) 12.5 MG tablet Take 12.5 mg by mouth 2 (two) times daily with a meal.     . enalapril (VASOTEC) 5 MG tablet Take 5 mg by mouth daily.    . hydrochlorothiazide (HYDRODIURIL) 25 MG tablet Take 25 mg by mouth daily.    Marland Kitchen HYDROcodone-acetaminophen (NORCO/VICODIN) 5-325 MG tablet One tablet every four hours as needed for acute pain.  Limit of five days per [place] statue. 30 tablet 0  . insulin aspart (NOVOLOG) 100 UNIT/ML injection Inject 20 Units into the skin 2 (two) times daily.     . insulin glargine (LANTUS) 100 UNIT/ML injection Inject 40 Units into the skin 2 (two) times daily.     Marland Kitchen omeprazole (PRILOSEC) 20 MG capsule Take 20 mg by mouth daily.    . pravastatin (PRAVACHOL) 80 MG tablet Take 80 mg by mouth daily.    . nitroGLYCERIN (NITROSTAT) 0.4 MG SL tablet Place 1 tablet (0.4 mg total) under the tongue every 5 (five) minutes as needed. 25 tablet 3   No current facility-administered medications for this visit.    Allergies:  Altace [ramipril]; Atacand [candesartan]; Avandia [rosiglitazone]; Crestor [rosuvastatin]; Diovan [valsartan]; Lisinopril; Lotensin [benazepril]; Norvasc [amlodipine besylate]; and Zetia [ezetimibe]   Social History: The patient  reports that she quit smoking about 10 years ago. Her smoking use included Cigarettes. She has never  used smokeless tobacco. She reports that she does not drink alcohol or use drugs.   ROS:  Please see the history of present illness. Otherwise, complete review of systems is positive for hearing loss, macular degeneration, she is no longer driving..  All other systems are reviewed and negative.   Physical Exam: VS:  BP 138/66   Pulse 84   Ht 5\' 4"  (1.626 m)   Wt 215 lb (97.5 kg)   SpO2 92%   BMI 36.90 kg/m , BMI Body mass index is 36.9 kg/m.  Wt Readings from Last 3 Encounters:  12/03/16 215 lb (97.5 kg)  07/29/16 207 lb (93.9 kg)  05/27/16 206 lb (93.4 kg)    General: Elderly woman, appears comfortable at  rest. HEENT: Conjunctiva and lids normal, oropharynx clear. Neck: Supple, no elevated JVP or carotid bruits, no thyromegaly. Lungs: Clear to auscultation, nonlabored breathing at rest. Cardiac: Regular rate and rhythm, no S3 or significant systolic murmur, no pericardial rub. Abdomen: Soft, nontender, bowel sounds present. Extremities: No pitting edema, distal pulses 2+. Skin: Warm and dry. Musculoskeletal: No kyphosis. Neuropsychiatric: Alert and oriented x3, affect grossly appropriate.  ECG: I personally reviewed the tracing from 10/15/2015 which showed sinus rhythm with prolonged PR interval.  Recent Labwork: 02/04/2016: Hemoglobin 11.9; Platelets 243   Other Studies Reviewed Today:  Echocardiogram 06/12/2013: Study Conclusions  - Study data: Technically adequate study. - Left ventricle: The cavity size was normal. Wall thickness was normal. Systolic function was vigorous. The estimated ejection fraction was in the range of 65% to 70%. There is LV cavity obliteration during systole that causes a mild intracavitary gradient. Doppler parameters are consistent with abnormal left ventricular relaxation (grade 1 diastolic dysfunction). - Aortic valve: Mildly calcified annulus. Mildly thickened leaflets. Valve area: 2.95cm^2(VTI). Valve area: 3.08cm^2 (Vmax). - Mitral valve: Mildly to moderately calcified annulus. Mildly thickened leaflets . - Systemic veins: The IVC is small, consistent with low RA pressures and hypovolemia.  Assessment and Plan:  1. CAD status post previous DES interventions to the LAD as outlined above. She remains stable in terms of angina symptoms on medical therapy and her ECG is also stable. Continue with current regimen and observation. Refill provided for fresh bottle of nitroglycerin. She was not able to tolerate Imdur due to headaches.  2. Hyperlipidemia, continues on Pravachol. Requesting recent lab work from Dr. Willey Blade.  3.  Essential hypertension, blood pressure is adequately controlled today. She continues on Coreg, Vasotec, and HCTZ.  4. CKD stage III, follow-up lab work being requested from Dr. Willey Blade.  Current medicines were reviewed with the patient today.   Orders Placed This Encounter  Procedures  . EKG 12-Lead    Disposition: Follow-up in 6 months.  Signed, Satira Sark, MD, [facility] 12/03/2016 1:29 PM    [facility] [address] [place], [place] [address] Phone: [phone]; Fax: [phone]

## 2016-12-03 NOTE — Patient Instructions (Signed)
Your physician wants you to follow-up in: 6 months with Dr Ferne Reus will receive a reminder letter in the mail two months in advance. If you don't receive a letter, please call our office to schedule the follow-up appointment.    Your physician recommends that you continue on your current medications as directed. Please refer to the Current Medication list given to you today.    I called in nitroglycerine refill for you     No lab work or tests ordered today.      Thank you for choosing [facility] !

## 2016-12-07 DIAGNOSIS — E1122 Type 2 diabetes mellitus with diabetic chronic kidney disease: Secondary | ICD-10-CM | POA: Diagnosis not present

## 2016-12-07 DIAGNOSIS — N184 Chronic kidney disease, stage 4 (severe): Secondary | ICD-10-CM | POA: Diagnosis not present

## 2016-12-07 DIAGNOSIS — R2689 Other abnormalities of gait and mobility: Secondary | ICD-10-CM | POA: Diagnosis not present

## 2016-12-07 DIAGNOSIS — Z23 Encounter for immunization: Secondary | ICD-10-CM | POA: Diagnosis not present

## 2016-12-07 DIAGNOSIS — E785 Hyperlipidemia, unspecified: Secondary | ICD-10-CM | POA: Diagnosis not present

## 2016-12-09 DIAGNOSIS — H353211 Exudative age-related macular degeneration, right eye, with active choroidal neovascularization: Secondary | ICD-10-CM | POA: Diagnosis not present

## 2016-12-09 DIAGNOSIS — H353113 Nonexudative age-related macular degeneration, right eye, advanced atrophic without subfoveal involvement: Secondary | ICD-10-CM | POA: Diagnosis not present

## 2016-12-09 DIAGNOSIS — H353221 Exudative age-related macular degeneration, left eye, with active choroidal neovascularization: Secondary | ICD-10-CM | POA: Diagnosis not present

## 2016-12-09 DIAGNOSIS — H3561 Retinal hemorrhage, right eye: Secondary | ICD-10-CM | POA: Diagnosis not present

## 2016-12-09 DIAGNOSIS — E113493 Type 2 diabetes mellitus with severe nonproliferative diabetic retinopathy without macular edema, bilateral: Secondary | ICD-10-CM | POA: Diagnosis not present

## 2016-12-15 DIAGNOSIS — H353221 Exudative age-related macular degeneration, left eye, with active choroidal neovascularization: Secondary | ICD-10-CM | POA: Diagnosis not present

## 2016-12-29 DIAGNOSIS — E785 Hyperlipidemia, unspecified: Secondary | ICD-10-CM | POA: Diagnosis not present

## 2016-12-29 DIAGNOSIS — G4700 Insomnia, unspecified: Secondary | ICD-10-CM | POA: Diagnosis not present

## 2016-12-29 DIAGNOSIS — I10 Essential (primary) hypertension: Secondary | ICD-10-CM | POA: Diagnosis not present

## 2016-12-29 DIAGNOSIS — E119 Type 2 diabetes mellitus without complications: Secondary | ICD-10-CM | POA: Diagnosis not present

## 2016-12-29 DIAGNOSIS — N181 Chronic kidney disease, stage 1: Secondary | ICD-10-CM | POA: Diagnosis not present

## 2016-12-29 DIAGNOSIS — R809 Proteinuria, unspecified: Secondary | ICD-10-CM | POA: Diagnosis not present

## 2016-12-30 ENCOUNTER — Encounter: Payer: Self-pay | Admitting: Orthopaedic Surgery

## 2016-12-30 ENCOUNTER — Ambulatory Visit (INDEPENDENT_AMBULATORY_CARE_PROVIDER_SITE_OTHER): Payer: PPO | Admitting: Orthopaedic Surgery

## 2016-12-30 DIAGNOSIS — M25561 Pain in right knee: Secondary | ICD-10-CM

## 2016-12-30 DIAGNOSIS — M25562 Pain in left knee: Secondary | ICD-10-CM | POA: Diagnosis not present

## 2016-12-30 DIAGNOSIS — G8929 Other chronic pain: Secondary | ICD-10-CM | POA: Diagnosis not present

## 2016-12-30 MED ORDER — HYDROCODONE-ACETAMINOPHEN 5-325 MG PO TABS
1.0000 | ORAL_TABLET | Freq: Four times a day (QID) | ORAL | 0 refills | Status: DC | PRN
Start: 2016-12-30 — End: 2017-09-08

## 2016-12-30 NOTE — Progress Notes (Signed)
CC: Both of my knees are hurting. I would like an injection in both knees.  The patient has had chronic pain and tenderness of both knees for some time.  Injections help.  There is no locking or giving way of the knee.  There is no new trauma. There is no redness or signs of infections.  The knees have a mild effusion and some crepitus.  There is no redness or signs of recent trauma.  Right knee ROM is 0-100 and left knee ROM is 0-95.  Impression:  Chronic pain of the both knees  Return:  prn  PROCEDURE NOTE:  The patient requests injections of both knees, verbal consent was obtained.  The left and right knee were individually prepped appropriately after time out was performed.   Sterile technique was observed and injection of 1 cc of Depo-Medrol 40 mg with several cc's of plain xylocaine. Anesthesia was provided by ethyl chloride and a 20-gauge needle was used to inject each knee area. The injections were tolerated well.  A band aid dressing was applied.  The patient was advised to apply ice later today and tomorrow to the injection sight as needed.  I have reviewed the [place] web site prior to prescribing narcotic medicine for this patient.  Electronically Signed Sanjuana Kava, MD 10/18/20181:48 PM

## 2017-01-29 DIAGNOSIS — R809 Proteinuria, unspecified: Secondary | ICD-10-CM | POA: Diagnosis not present

## 2017-01-29 DIAGNOSIS — E119 Type 2 diabetes mellitus without complications: Secondary | ICD-10-CM | POA: Diagnosis not present

## 2017-01-29 DIAGNOSIS — G4700 Insomnia, unspecified: Secondary | ICD-10-CM | POA: Diagnosis not present

## 2017-01-29 DIAGNOSIS — N181 Chronic kidney disease, stage 1: Secondary | ICD-10-CM | POA: Diagnosis not present

## 2017-01-29 DIAGNOSIS — E785 Hyperlipidemia, unspecified: Secondary | ICD-10-CM | POA: Diagnosis not present

## 2017-01-29 DIAGNOSIS — I10 Essential (primary) hypertension: Secondary | ICD-10-CM | POA: Diagnosis not present

## 2017-01-31 DIAGNOSIS — I739 Peripheral vascular disease, unspecified: Secondary | ICD-10-CM | POA: Diagnosis not present

## 2017-01-31 DIAGNOSIS — E1151 Type 2 diabetes mellitus with diabetic peripheral angiopathy without gangrene: Secondary | ICD-10-CM | POA: Diagnosis not present

## 2017-01-31 DIAGNOSIS — M79675 Pain in left toe(s): Secondary | ICD-10-CM | POA: Diagnosis not present

## 2017-01-31 DIAGNOSIS — M79674 Pain in right toe(s): Secondary | ICD-10-CM | POA: Diagnosis not present

## 2017-01-31 DIAGNOSIS — B351 Tinea unguium: Secondary | ICD-10-CM | POA: Diagnosis not present

## 2017-01-31 DIAGNOSIS — L851 Acquired keratosis [keratoderma] palmaris et plantaris: Secondary | ICD-10-CM | POA: Diagnosis not present

## 2017-02-28 DIAGNOSIS — G4700 Insomnia, unspecified: Secondary | ICD-10-CM | POA: Diagnosis not present

## 2017-02-28 DIAGNOSIS — R809 Proteinuria, unspecified: Secondary | ICD-10-CM | POA: Diagnosis not present

## 2017-02-28 DIAGNOSIS — I10 Essential (primary) hypertension: Secondary | ICD-10-CM | POA: Diagnosis not present

## 2017-02-28 DIAGNOSIS — E119 Type 2 diabetes mellitus without complications: Secondary | ICD-10-CM | POA: Diagnosis not present

## 2017-02-28 DIAGNOSIS — E785 Hyperlipidemia, unspecified: Secondary | ICD-10-CM | POA: Diagnosis not present

## 2017-02-28 DIAGNOSIS — N181 Chronic kidney disease, stage 1: Secondary | ICD-10-CM | POA: Diagnosis not present

## 2017-03-18 DIAGNOSIS — I10 Essential (primary) hypertension: Secondary | ICD-10-CM | POA: Diagnosis not present

## 2017-03-18 DIAGNOSIS — E1129 Type 2 diabetes mellitus with other diabetic kidney complication: Secondary | ICD-10-CM | POA: Diagnosis not present

## 2017-03-18 DIAGNOSIS — E785 Hyperlipidemia, unspecified: Secondary | ICD-10-CM | POA: Diagnosis not present

## 2017-03-25 DIAGNOSIS — G4700 Insomnia, unspecified: Secondary | ICD-10-CM | POA: Diagnosis not present

## 2017-03-25 DIAGNOSIS — Z6838 Body mass index (BMI) 38.0-38.9, adult: Secondary | ICD-10-CM | POA: Diagnosis not present

## 2017-03-25 DIAGNOSIS — Z23 Encounter for immunization: Secondary | ICD-10-CM | POA: Diagnosis not present

## 2017-03-25 DIAGNOSIS — N184 Chronic kidney disease, stage 4 (severe): Secondary | ICD-10-CM | POA: Diagnosis not present

## 2017-03-25 DIAGNOSIS — E1122 Type 2 diabetes mellitus with diabetic chronic kidney disease: Secondary | ICD-10-CM | POA: Diagnosis not present

## 2017-03-31 DIAGNOSIS — E113493 Type 2 diabetes mellitus with severe nonproliferative diabetic retinopathy without macular edema, bilateral: Secondary | ICD-10-CM | POA: Diagnosis not present

## 2017-03-31 DIAGNOSIS — H353221 Exudative age-related macular degeneration, left eye, with active choroidal neovascularization: Secondary | ICD-10-CM | POA: Diagnosis not present

## 2017-03-31 DIAGNOSIS — H353211 Exudative age-related macular degeneration, right eye, with active choroidal neovascularization: Secondary | ICD-10-CM | POA: Diagnosis not present

## 2017-03-31 DIAGNOSIS — H3561 Retinal hemorrhage, right eye: Secondary | ICD-10-CM | POA: Diagnosis not present

## 2017-04-06 ENCOUNTER — Ambulatory Visit (INDEPENDENT_AMBULATORY_CARE_PROVIDER_SITE_OTHER): Payer: PPO

## 2017-04-06 ENCOUNTER — Ambulatory Visit: Payer: PPO | Admitting: Orthopaedic Surgery

## 2017-04-06 ENCOUNTER — Encounter: Payer: Self-pay | Admitting: Orthopaedic Surgery

## 2017-04-06 DIAGNOSIS — M25562 Pain in left knee: Secondary | ICD-10-CM | POA: Diagnosis not present

## 2017-04-06 DIAGNOSIS — G8929 Other chronic pain: Secondary | ICD-10-CM

## 2017-04-06 DIAGNOSIS — M25552 Pain in left hip: Secondary | ICD-10-CM | POA: Diagnosis not present

## 2017-04-06 DIAGNOSIS — M25561 Pain in right knee: Secondary | ICD-10-CM

## 2017-04-06 NOTE — Progress Notes (Signed)
Patient CH:ENIDP Marie House, female DOB:Oct 06, 1934, 83 y.o. OEU:[id number]  Chief Complaint  Patient presents with  . Follow-up    Bilateral knee pain, request injections and left hip pain    HPI  Marie House is a 83 y.o. female who has bilateral knee pain.  She has no giving way. She has swelling and popping.  She requests injections.  She also complains of pain of the left hip, more with ambulation. She has no trauma.  She has no redness.  She uses a cane.  It hurts more after walking. Sitting does not hurt. She has no numbness. HPI  There is no height or weight on file to calculate BMI.  ROS  Review of Systems  HENT: Negative for congestion.   Respiratory: Positive for shortness of breath. Negative for cough.   Cardiovascular: Negative for chest pain and leg swelling.  Endocrine: Positive for cold intolerance.  Musculoskeletal: Positive for arthralgias, joint swelling and myalgias.  Allergic/Immunologic: Positive for environmental allergies.  Neurological: Positive for dizziness, syncope and light-headedness.  All other systems reviewed and are negative.   Past Medical History:  Diagnosis Date  . Arthritis   . Carotid artery disease ([place])   . CKD (chronic kidney disease) stage 3, GFR 30-59 ml/min (HCC)   . Coronary atherosclerosis of native coronary artery    Cypher DES LAD 2003, Taxus DES LAD 2004  . Essential hypertension, benign   . Mixed hyperlipidemia   . Type 2 diabetes mellitus ([place])     Past Surgical History:  Procedure Laterality Date  . CAROTID ENDARTERECTOMY Right 2004  . CATARACT EXTRACTION    . CHOLECYSTECTOMY    . Left breast mastectomy     25 yrs ago  . MASTECTOMY Left   . PARTIAL HYSTERECTOMY    . TONSILLECTOMY      Family History  Problem Relation Age of Onset  . CAD Mother     Social History Social History   Tobacco Use  . Smoking status: Former Smoker    Types: Cigarettes    Last attempt to quit: 03/15/2006    Years since quitting:  11.0  . Smokeless tobacco: Never Used  Substance Use Topics  . Alcohol use: No    Alcohol/week: 0.0 oz  . Drug use: No    Allergies  Allergen Reactions  . Altace [Ramipril]   . Atacand [Candesartan]   . Avandia [Rosiglitazone]   . Crestor [Rosuvastatin]   . Diovan [Valsartan]   . Lisinopril   . Lotensin [Benazepril]   . Norvasc [Amlodipine Besylate]   . Zetia [Ezetimibe]     Current Outpatient Medications  Medication Sig Dispense Refill  . ALPRAZolam (XANAX) 0.5 MG tablet Take 0.5 mg by mouth 3 (three) times daily as needed for anxiety.    Marland Kitchen aspirin EC 81 MG tablet Take 81 mg by mouth daily.    . carvedilol (COREG) 12.5 MG tablet Take 12.5 mg by mouth 2 (two) times daily with a meal.     . enalapril (VASOTEC) 5 MG tablet Take 5 mg by mouth daily.    . hydrochlorothiazide (HYDRODIURIL) 25 MG tablet Take 25 mg by mouth daily.    Marland Kitchen HYDROcodone-acetaminophen (NORCO/VICODIN) 5-325 MG tablet Take 1 tablet by mouth every 6 (six) hours as needed for moderate pain (Must last 14 days.Do not take and drive a car or use machinery.). 50 tablet 0  . insulin aspart (NOVOLOG) 100 UNIT/ML injection Inject 20 Units into the skin 2 (two) times daily.     Marland Kitchen  insulin glargine (LANTUS) 100 UNIT/ML injection Inject 40 Units into the skin 2 (two) times daily.     . nitroGLYCERIN (NITROSTAT) 0.4 MG SL tablet ONE TABLET UNDER TONGUE AS NEEDED FOR CHEST PAIN EVERY 5 MINUTES CONTACT MD AS DIRECTED 150 tablet 3  . omeprazole (PRILOSEC) 20 MG capsule Take 20 mg by mouth daily.    . pravastatin (PRAVACHOL) 80 MG tablet Take 80 mg by mouth daily.     No current facility-administered medications for this visit.      Physical Exam  There were no vitals taken for this visit.  Constitutional: overall normal hygiene, normal nutrition, well developed, normal grooming, normal body habitus. Assistive device:cane  Musculoskeletal: gait and station Limp left, muscle tone and strength are normal, no tremors or  atrophy is present.  .  Neurological: coordination overall normal.  Deep tendon reflex/nerve stretch intact.  Sensation normal.  Cranial nerves II-XII intact.   Skin:   Normal overall no scars, lesions, ulcers or rashes. No psoriasis.  Psychiatric: Alert and oriented x 3.  Recent memory intact, remote memory unclear.  Normal mood and affect. Well groomed.  Good eye contact.  Cardiovascular: overall no swelling, no varicosities, no edema bilaterally, normal temperatures of the legs and arms, no clubbing, cyanosis and good capillary refill.  Lymphatic: palpation is normal.  Her left hip is tender but has good ROM.  She has no crepitus.  She uses a cane and has a limp to the left.  All other systems reviewed and are negative   The patient has been educated about the nature of the problem(s) and counseled on treatment options.  The patient appeared to understand what I have discussed and is in agreement with it.  X-rays were done of the left hip separately reported.  Encounter Diagnoses  Name Primary?  . Chronic hip pain, left Yes  . Chronic pain of right knee   . Chronic pain of left knee     PROCEDURE NOTE:  The patient requests injections of the left knee , verbal consent was obtained.  The left knee was prepped appropriately after time out was performed.   Sterile technique was observed and injection of 1 cc of Depo-Medrol 40 mg with several cc's of plain xylocaine. Anesthesia was provided by ethyl chloride and a 20-gauge needle was used to inject the knee area. The injection was tolerated well.  A band aid dressing was applied.  The patient was advised to apply ice later today and tomorrow to the injection sight as needed.  PROCEDURE NOTE:  The patient requests injections of the right knee , verbal consent was obtained.  The right knee was prepped appropriately after time out was performed.   Sterile technique was observed and injection of 1 cc of Depo-Medrol 40 mg with  several cc's of plain xylocaine. Anesthesia was provided by ethyl chloride and a 20-gauge needle was used to inject the knee area. The injection was tolerated well.  A band aid dressing was applied.  The patient was advised to apply ice later today and tomorrow to the injection sight as needed.  PLAN Call if any problems.  Precautions discussed.  Continue current medications.   Return to clinic 1 month  I am concerned her hip pain may be referred from the back.  She is to note any back discomfort.  She may need X-rays of the lumbar spine on return.   Electronically Signed Sanjuana Kava, MD 1/23/20193:11 PM

## 2017-04-11 DIAGNOSIS — I739 Peripheral vascular disease, unspecified: Secondary | ICD-10-CM | POA: Diagnosis not present

## 2017-04-11 DIAGNOSIS — L851 Acquired keratosis [keratoderma] palmaris et plantaris: Secondary | ICD-10-CM | POA: Diagnosis not present

## 2017-04-11 DIAGNOSIS — M79674 Pain in right toe(s): Secondary | ICD-10-CM | POA: Diagnosis not present

## 2017-04-11 DIAGNOSIS — M79675 Pain in left toe(s): Secondary | ICD-10-CM | POA: Diagnosis not present

## 2017-04-11 DIAGNOSIS — E1151 Type 2 diabetes mellitus with diabetic peripheral angiopathy without gangrene: Secondary | ICD-10-CM | POA: Diagnosis not present

## 2017-04-11 DIAGNOSIS — B351 Tinea unguium: Secondary | ICD-10-CM | POA: Diagnosis not present

## 2017-05-04 ENCOUNTER — Encounter: Payer: Self-pay | Admitting: Orthopaedic Surgery

## 2017-05-04 ENCOUNTER — Ambulatory Visit: Payer: PPO | Admitting: Orthopaedic Surgery

## 2017-05-04 VITALS — BP 190/69 | Temp 97.5°F | Ht 64.0 in | Wt 211.6 lb

## 2017-05-04 DIAGNOSIS — M25562 Pain in left knee: Secondary | ICD-10-CM

## 2017-05-04 DIAGNOSIS — G8929 Other chronic pain: Secondary | ICD-10-CM | POA: Diagnosis not present

## 2017-05-04 DIAGNOSIS — M25561 Pain in right knee: Secondary | ICD-10-CM | POA: Diagnosis not present

## 2017-05-04 NOTE — Progress Notes (Signed)
CC: Both of my knees are hurting. I would like an injection in both knees.  The patient has had chronic pain and tenderness of both knees for some time.  Injections help.  There is no locking or giving way of the knee.  There is no new trauma. There is no redness or signs of infections.  The knees have a mild effusion and some crepitus.  There is no redness or signs of recent trauma.  Right knee ROM is 0-105 and left knee ROM is 0-110.  Impression:  Chronic pain of the both knees  Return:  prn  PROCEDURE NOTE:  The patient requests injections of both knees, verbal consent was obtained.  The left and right knee were individually prepped appropriately after time out was performed.   Sterile technique was observed and injection of 1 cc of Depo-Medrol 40 mg with several cc's of plain xylocaine. Anesthesia was provided by ethyl chloride and a 20-gauge needle was used to inject each knee area. The injections were tolerated well.  A band aid dressing was applied.  The patient was advised to apply ice later today and tomorrow to the injection sight as needed.   Electronically Signed Sanjuana Kava, MD 2/20/20192:16 PM

## 2017-06-02 DIAGNOSIS — H353221 Exudative age-related macular degeneration, left eye, with active choroidal neovascularization: Secondary | ICD-10-CM | POA: Diagnosis not present

## 2017-06-02 DIAGNOSIS — E113493 Type 2 diabetes mellitus with severe nonproliferative diabetic retinopathy without macular edema, bilateral: Secondary | ICD-10-CM | POA: Diagnosis not present

## 2017-06-02 DIAGNOSIS — H353211 Exudative age-related macular degeneration, right eye, with active choroidal neovascularization: Secondary | ICD-10-CM | POA: Diagnosis not present

## 2017-06-02 DIAGNOSIS — H3561 Retinal hemorrhage, right eye: Secondary | ICD-10-CM | POA: Diagnosis not present

## 2017-06-09 DIAGNOSIS — E113491 Type 2 diabetes mellitus with severe nonproliferative diabetic retinopathy without macular edema, right eye: Secondary | ICD-10-CM | POA: Diagnosis not present

## 2017-06-15 DIAGNOSIS — E113492 Type 2 diabetes mellitus with severe nonproliferative diabetic retinopathy without macular edema, left eye: Secondary | ICD-10-CM | POA: Diagnosis not present

## 2017-06-20 DIAGNOSIS — E1151 Type 2 diabetes mellitus with diabetic peripheral angiopathy without gangrene: Secondary | ICD-10-CM | POA: Diagnosis not present

## 2017-06-20 DIAGNOSIS — M79675 Pain in left toe(s): Secondary | ICD-10-CM | POA: Diagnosis not present

## 2017-06-20 DIAGNOSIS — I739 Peripheral vascular disease, unspecified: Secondary | ICD-10-CM | POA: Diagnosis not present

## 2017-06-20 DIAGNOSIS — M79674 Pain in right toe(s): Secondary | ICD-10-CM | POA: Diagnosis not present

## 2017-06-20 DIAGNOSIS — B351 Tinea unguium: Secondary | ICD-10-CM | POA: Diagnosis not present

## 2017-06-20 DIAGNOSIS — L851 Acquired keratosis [keratoderma] palmaris et plantaris: Secondary | ICD-10-CM | POA: Diagnosis not present

## 2017-06-30 DIAGNOSIS — G4700 Insomnia, unspecified: Secondary | ICD-10-CM | POA: Diagnosis not present

## 2017-06-30 DIAGNOSIS — E1129 Type 2 diabetes mellitus with other diabetic kidney complication: Secondary | ICD-10-CM | POA: Diagnosis not present

## 2017-06-30 DIAGNOSIS — X32XXXD Exposure to sunlight, subsequent encounter: Secondary | ICD-10-CM | POA: Diagnosis not present

## 2017-06-30 DIAGNOSIS — I10 Essential (primary) hypertension: Secondary | ICD-10-CM | POA: Diagnosis not present

## 2017-06-30 DIAGNOSIS — Z79899 Other long term (current) drug therapy: Secondary | ICD-10-CM | POA: Diagnosis not present

## 2017-06-30 DIAGNOSIS — N184 Chronic kidney disease, stage 4 (severe): Secondary | ICD-10-CM | POA: Diagnosis not present

## 2017-06-30 DIAGNOSIS — Z85828 Personal history of other malignant neoplasm of skin: Secondary | ICD-10-CM | POA: Diagnosis not present

## 2017-06-30 DIAGNOSIS — L570 Actinic keratosis: Secondary | ICD-10-CM | POA: Diagnosis not present

## 2017-06-30 DIAGNOSIS — Z08 Encounter for follow-up examination after completed treatment for malignant neoplasm: Secondary | ICD-10-CM | POA: Diagnosis not present

## 2017-07-07 ENCOUNTER — Other Ambulatory Visit (HOSPITAL_COMMUNITY): Payer: Self-pay | Admitting: Internal Medicine

## 2017-07-07 ENCOUNTER — Ambulatory Visit (HOSPITAL_COMMUNITY)
Admission: RE | Admit: 2017-07-07 | Discharge: 2017-07-07 | Disposition: A | Discharge status: Home / Self Care | Payer: PPO | Source: Ambulatory Visit | Attending: Internal Medicine | Admitting: Internal Medicine

## 2017-07-07 DIAGNOSIS — E1122 Type 2 diabetes mellitus with diabetic chronic kidney disease: Secondary | ICD-10-CM | POA: Diagnosis not present

## 2017-07-07 DIAGNOSIS — R0602 Shortness of breath: Secondary | ICD-10-CM

## 2017-07-07 DIAGNOSIS — Z6839 Body mass index (BMI) 39.0-39.9, adult: Secondary | ICD-10-CM | POA: Diagnosis not present

## 2017-07-07 DIAGNOSIS — N184 Chronic kidney disease, stage 4 (severe): Secondary | ICD-10-CM | POA: Diagnosis not present

## 2017-07-14 DIAGNOSIS — H353124 Nonexudative age-related macular degeneration, left eye, advanced atrophic with subfoveal involvement: Secondary | ICD-10-CM | POA: Diagnosis not present

## 2017-07-14 DIAGNOSIS — H353221 Exudative age-related macular degeneration, left eye, with active choroidal neovascularization: Secondary | ICD-10-CM | POA: Diagnosis not present

## 2017-07-14 DIAGNOSIS — H353211 Exudative age-related macular degeneration, right eye, with active choroidal neovascularization: Secondary | ICD-10-CM | POA: Diagnosis not present

## 2017-07-14 DIAGNOSIS — E113491 Type 2 diabetes mellitus with severe nonproliferative diabetic retinopathy without macular edema, right eye: Secondary | ICD-10-CM | POA: Diagnosis not present

## 2017-07-14 DIAGNOSIS — E113492 Type 2 diabetes mellitus with severe nonproliferative diabetic retinopathy without macular edema, left eye: Secondary | ICD-10-CM | POA: Diagnosis not present

## 2017-07-15 IMAGING — CT CT HEAD W/O CM
4 series · 16 of 47 positions shown, 18 images · non-contrast
Comparison: None.

CLINICAL DATA: Dizziness for 2 days.  Fall 1 day prior

EXAM:
CT HEAD WITHOUT CONTRAST
TECHNIQUE: Contiguous axial images were obtained from the base of the skull
through the vertex without intravenous contrast.

[Series 2: head w/o · axial · non-contrast · 0.42mm/px · z∈[+1229,+1341]mm · 8 of 38 slices shown, 10 images]
[im 5/38  brain]
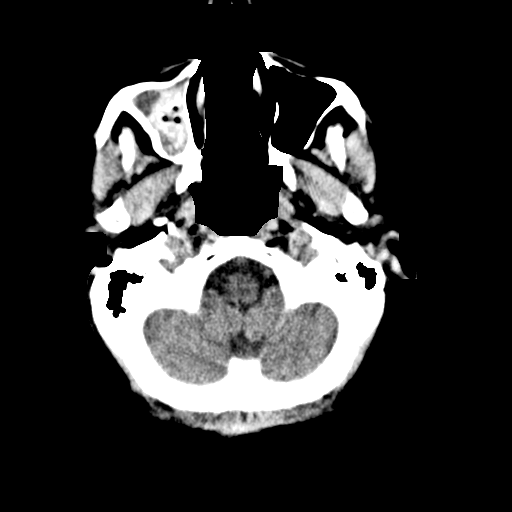
[im 5/38  bone]
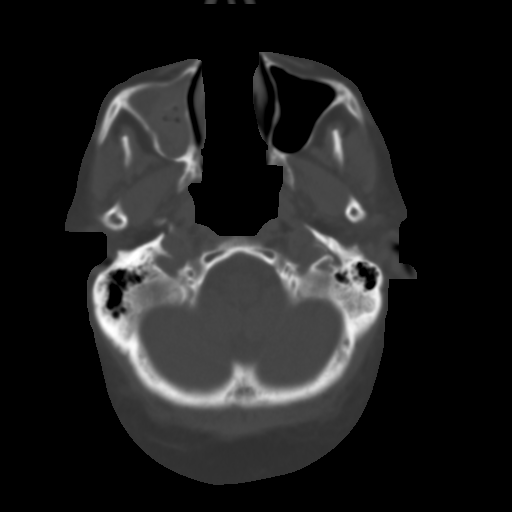
[im 9/38  brain]
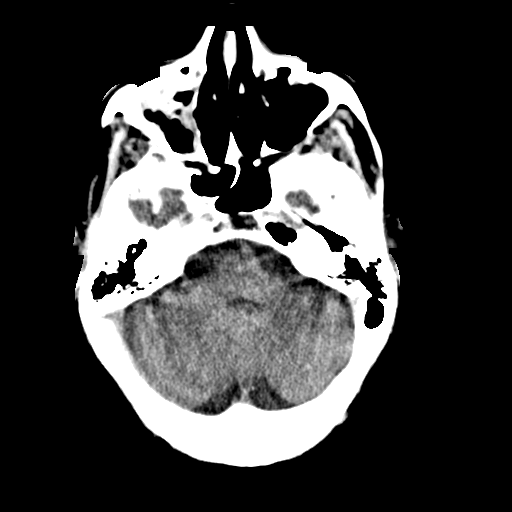
[im 13/38  brain]
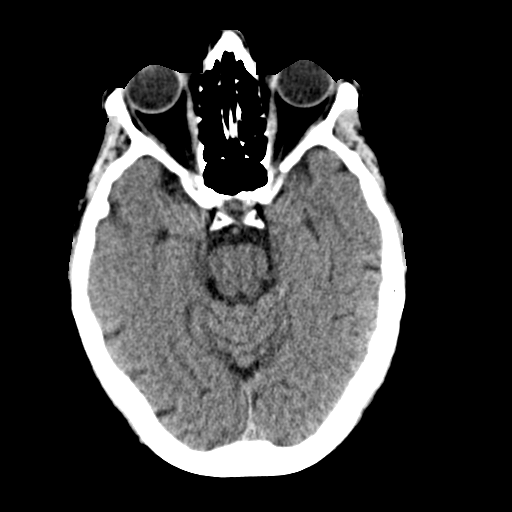
[im 17/38  brain]
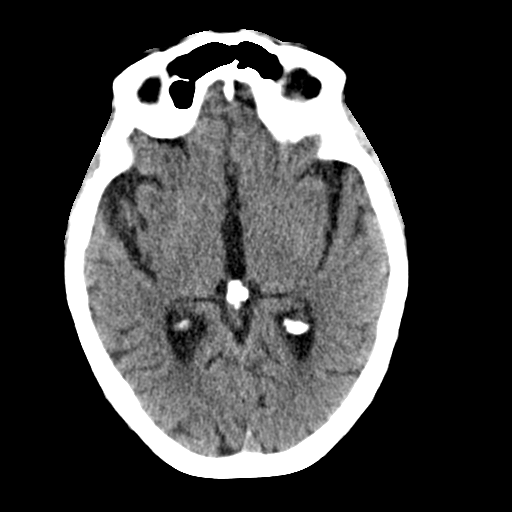
[im 21/38  brain]
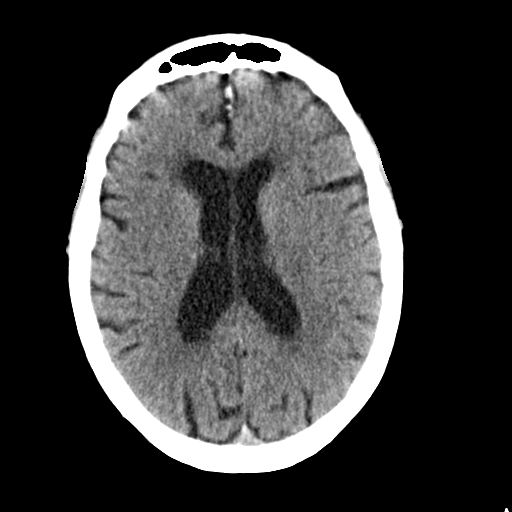
[im 21/38  bone]
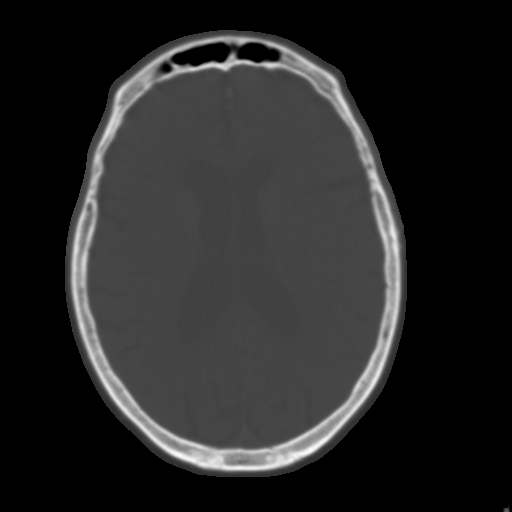
[im 25/38  brain]
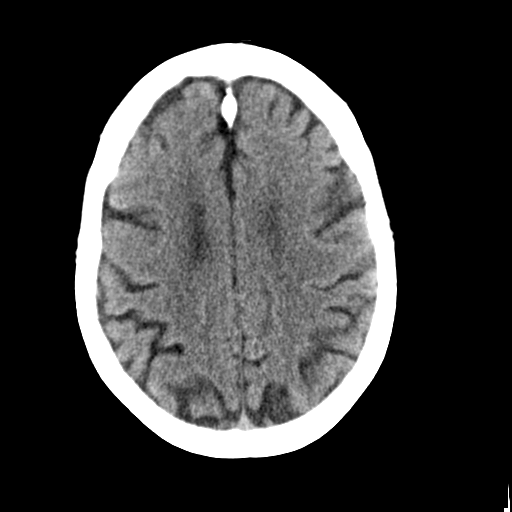
[im 29/38  brain]
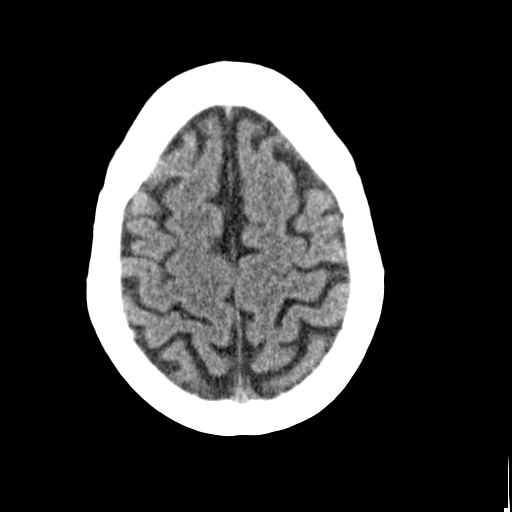
[im 33/38  brain]
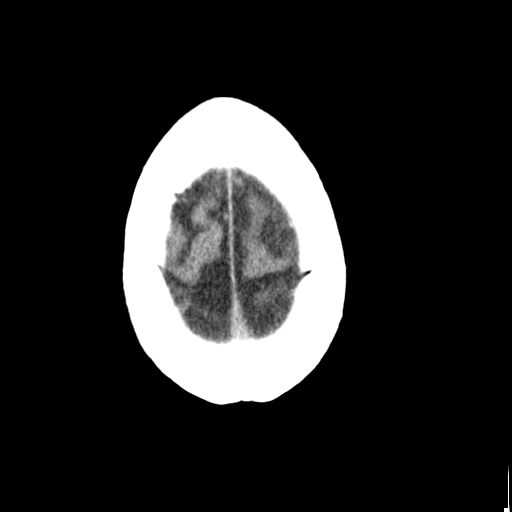

[Series 3: head bone · axial · 0.42mm/px · z∈[+1227,+1243]mm · 2 of 76 slices shown]
[im 8/76  bone]
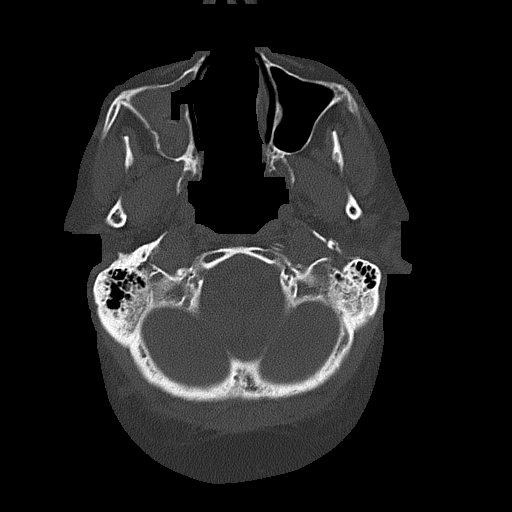
[im 16/76  bone]
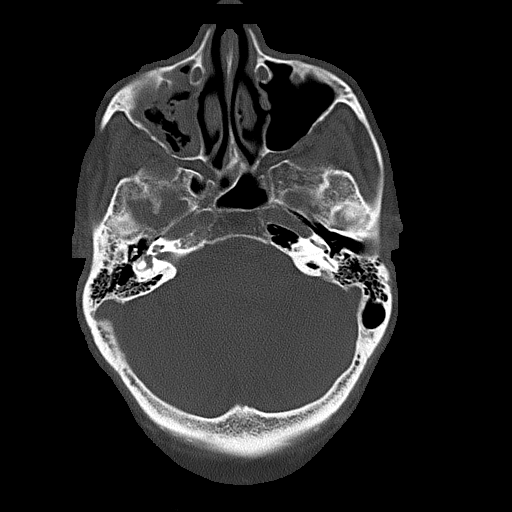

[Series 4: coronal · coronal · 0.31mm/px · 3 of 67 slices shown]
[im 23/67  brain]
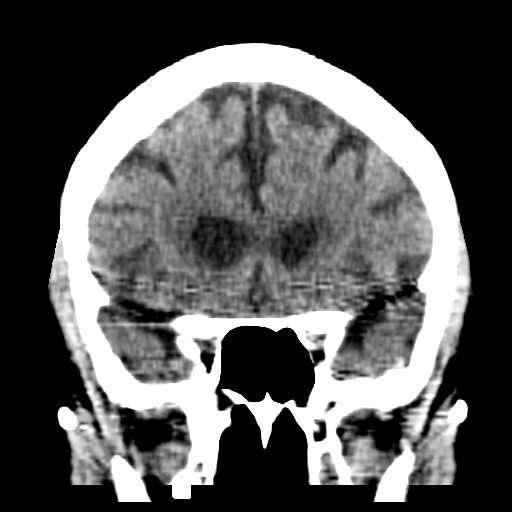
[im 30/67  brain]
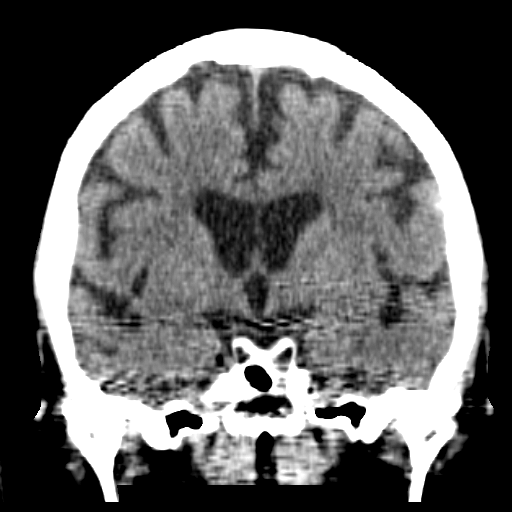
[im 37/67  brain]
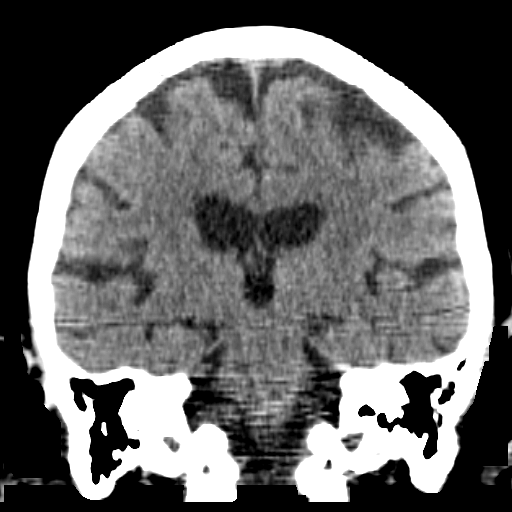

[Series 5: sagittal · sagittal · 0.33mm/px · 3 of 66 slices shown]
[im 22/66  brain]
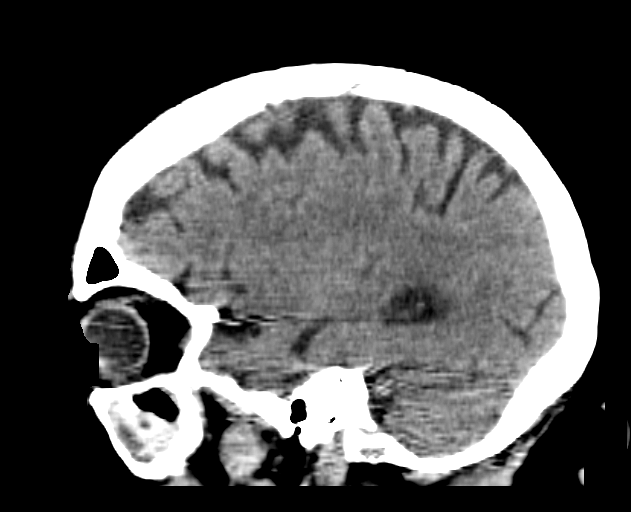
[im 33/66  brain]
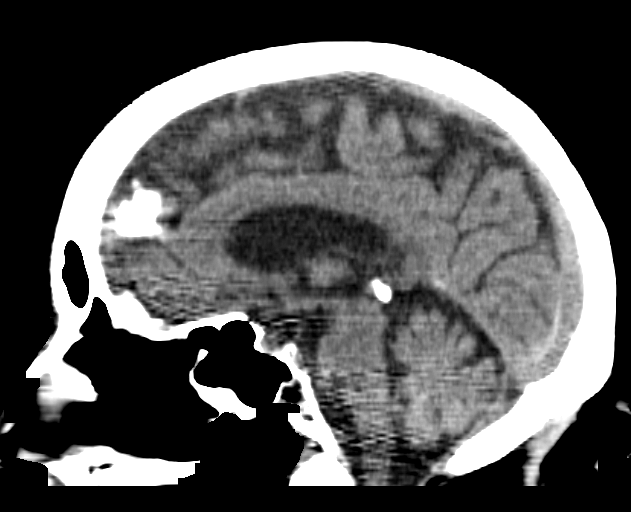
[im 44/66  brain]
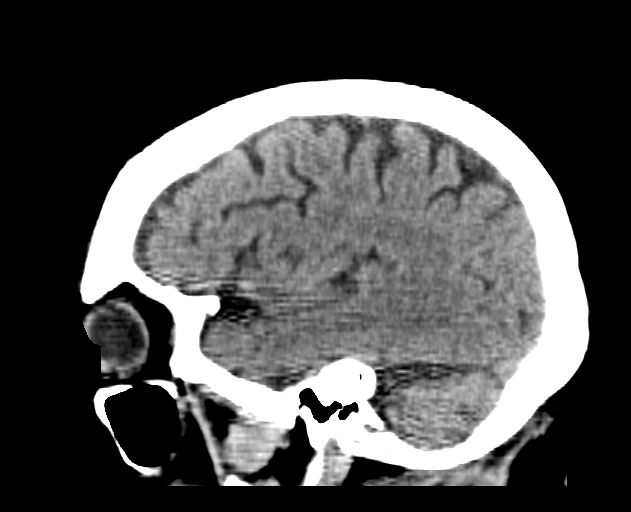

[16 of 47 positions shown; findings below may reference images not displayed]

FINDINGS: Brain: There is mild diffuse atrophy. There is no intracranial mass,
hemorrhage, extra-axial fluid collection, or midline shift. There is
fairly mild patchy small vessel disease in the centra semiovale
bilaterally. There is evidence of a prior small lacunar infarct in
the periphery of the mid left cerebellum posteriorly. Elsewhere
gray-white compartments appear normal. No acute infarct is evident
on this study.

Vascular: No hyperdense vessel is seen. There is calcification in
the carotid siphon regions bilaterally.

Skull:  Bony calvarium appears intact.

Sinuses/Orbits: There is extensive opacification in the right
maxillary antrum. Other paranasal sinuses are clear. Orbits appear
symmetric bilaterally.

Other:  Mastoid air cells are clear.
IMPRESSION: Atrophy with patchy periventricular small vessel disease. No
intracranial mass, hemorrhage, or extra-axial fluid collection. No
acute infarct. Extensive right maxillary sinus disease. Carotid
siphon calcification bilaterally.

## 2017-07-27 ENCOUNTER — Encounter: Payer: Self-pay | Admitting: Orthopaedic Surgery

## 2017-07-27 ENCOUNTER — Ambulatory Visit: Payer: PPO | Admitting: Orthopaedic Surgery

## 2017-07-27 VITALS — BP 155/74 | HR 80 | Ht 64.0 in | Wt 211.0 lb

## 2017-07-27 DIAGNOSIS — M25561 Pain in right knee: Secondary | ICD-10-CM | POA: Diagnosis not present

## 2017-07-27 DIAGNOSIS — G8929 Other chronic pain: Secondary | ICD-10-CM | POA: Diagnosis not present

## 2017-07-27 DIAGNOSIS — M25562 Pain in left knee: Secondary | ICD-10-CM

## 2017-07-27 NOTE — Progress Notes (Signed)
CC: Both of my knees are hurting. I would like an injection in both knees.  The patient has had chronic pain and tenderness of both knees for some time.  Injections help.  There is no locking or giving way of the knee.  There is no new trauma. There is no redness or signs of infections.  The knees have a mild effusion and some crepitus.  There is no redness or signs of recent trauma.  Right knee ROM is 0-100 and left knee ROM is 0-105.  Impression:  Chronic pain of the both knees  Return:  prn  PROCEDURE NOTE:  The patient requests injections of both knees, verbal consent was obtained.  The left and right knee were individually prepped appropriately after time out was performed.   Sterile technique was observed and injection of 1 cc of Depo-Medrol 40 mg with several cc's of plain xylocaine. Anesthesia was provided by ethyl chloride and a 20-gauge needle was used to inject each knee area. The injections were tolerated well.  A band aid dressing was applied.  The patient was advised to apply ice later today and tomorrow to the injection sight as needed.   Electronically Signed Sanjuana Kava, MD 5/15/20192:46 PM

## 2017-08-29 DIAGNOSIS — L851 Acquired keratosis [keratoderma] palmaris et plantaris: Secondary | ICD-10-CM | POA: Diagnosis not present

## 2017-08-29 DIAGNOSIS — M79674 Pain in right toe(s): Secondary | ICD-10-CM | POA: Diagnosis not present

## 2017-08-29 DIAGNOSIS — I739 Peripheral vascular disease, unspecified: Secondary | ICD-10-CM | POA: Diagnosis not present

## 2017-08-29 DIAGNOSIS — M79675 Pain in left toe(s): Secondary | ICD-10-CM | POA: Diagnosis not present

## 2017-08-29 DIAGNOSIS — B351 Tinea unguium: Secondary | ICD-10-CM | POA: Diagnosis not present

## 2017-08-29 DIAGNOSIS — E1151 Type 2 diabetes mellitus with diabetic peripheral angiopathy without gangrene: Secondary | ICD-10-CM | POA: Diagnosis not present

## 2017-09-08 ENCOUNTER — Ambulatory Visit: Payer: PPO | Admitting: Orthopaedic Surgery

## 2017-09-08 ENCOUNTER — Encounter: Payer: Self-pay | Admitting: Orthopaedic Surgery

## 2017-09-08 DIAGNOSIS — M25561 Pain in right knee: Secondary | ICD-10-CM

## 2017-09-08 DIAGNOSIS — G8929 Other chronic pain: Secondary | ICD-10-CM

## 2017-09-08 DIAGNOSIS — M25562 Pain in left knee: Secondary | ICD-10-CM

## 2017-09-08 MED ORDER — HYDROCODONE-ACETAMINOPHEN 5-325 MG PO TABS: 1.0000 | ORAL_TABLET | Freq: Four times a day (QID) | ORAL | 0 refills | Status: DC | PRN

## 2017-09-08 NOTE — Progress Notes (Signed)
CC: Both of my knees are hurting. I would like an injection in both knees.  The patient has had chronic pain and tenderness of both knees for some time.  Injections help.  There is no locking or giving way of the knee.  There is no new trauma. There is no redness or signs of infections.  The knees have a mild effusion and some crepitus.  There is no redness or signs of recent trauma.  Right knee ROM is 0-100 and left knee ROM is 0-95.  Impression:  Chronic pain of the both knees  Return:  prn  PROCEDURE NOTE:  The patient requests injections of both knees, verbal consent was obtained.  The left and right knee were individually prepped appropriately after time out was performed.   Sterile technique was observed and injection of 1 cc of Depo-Medrol 40 mg with several cc's of plain xylocaine. Anesthesia was provided by ethyl chloride and a 20-gauge needle was used to inject each knee area. The injections were tolerated well.  A band aid dressing was applied.  The patient was advised to apply ice later today and tomorrow to the injection sight as needed.   I have reviewed the [place] web site prior to prescribing narcotic medicine for this patient.  Electronically Signed Sanjuana Kava, MD 6/27/20192:43 PM

## 2017-10-07 DIAGNOSIS — Z79899 Other long term (current) drug therapy: Secondary | ICD-10-CM | POA: Diagnosis not present

## 2017-10-07 DIAGNOSIS — I2510 Atherosclerotic heart disease of native coronary artery without angina pectoris: Secondary | ICD-10-CM | POA: Diagnosis not present

## 2017-10-07 DIAGNOSIS — N184 Chronic kidney disease, stage 4 (severe): Secondary | ICD-10-CM | POA: Diagnosis not present

## 2017-10-07 DIAGNOSIS — E1129 Type 2 diabetes mellitus with other diabetic kidney complication: Secondary | ICD-10-CM | POA: Diagnosis not present

## 2017-10-14 DIAGNOSIS — F419 Anxiety disorder, unspecified: Secondary | ICD-10-CM | POA: Diagnosis not present

## 2017-10-14 DIAGNOSIS — E1122 Type 2 diabetes mellitus with diabetic chronic kidney disease: Secondary | ICD-10-CM | POA: Diagnosis not present

## 2017-10-14 DIAGNOSIS — N184 Chronic kidney disease, stage 4 (severe): Secondary | ICD-10-CM | POA: Diagnosis not present

## 2017-10-20 DIAGNOSIS — H353222 Exudative age-related macular degeneration, left eye, with inactive choroidal neovascularization: Secondary | ICD-10-CM | POA: Diagnosis not present

## 2017-10-20 DIAGNOSIS — E113491 Type 2 diabetes mellitus with severe nonproliferative diabetic retinopathy without macular edema, right eye: Secondary | ICD-10-CM | POA: Diagnosis not present

## 2017-10-20 DIAGNOSIS — E113492 Type 2 diabetes mellitus with severe nonproliferative diabetic retinopathy without macular edema, left eye: Secondary | ICD-10-CM | POA: Diagnosis not present

## 2017-10-20 DIAGNOSIS — H353212 Exudative age-related macular degeneration, right eye, with inactive choroidal neovascularization: Secondary | ICD-10-CM | POA: Diagnosis not present

## 2017-10-27 ENCOUNTER — Ambulatory Visit: Payer: PPO | Admitting: Orthopaedic Surgery

## 2017-10-27 ENCOUNTER — Encounter: Payer: Self-pay | Admitting: Orthopaedic Surgery

## 2017-10-27 VITALS — BP 155/71 | HR 84 | Ht 64.0 in | Wt 211.0 lb

## 2017-10-27 DIAGNOSIS — M7062 Trochanteric bursitis, left hip: Secondary | ICD-10-CM

## 2017-10-27 DIAGNOSIS — M25561 Pain in right knee: Secondary | ICD-10-CM

## 2017-10-27 DIAGNOSIS — G8929 Other chronic pain: Secondary | ICD-10-CM

## 2017-10-27 MED ORDER — HYDROCODONE-ACETAMINOPHEN 5-325 MG PO TABS: 1.0000 | ORAL_TABLET | Freq: Four times a day (QID) | ORAL | 0 refills | Status: DC | PRN

## 2017-10-27 NOTE — Progress Notes (Signed)
PROCEDURE NOTE:  The patient request injection, verbal consent was obtained.  The left trochanteric area of the hip was prepped appropriately after time out was performed.   Sterile technique was observed and injection of 1 cc of Depo-Medrol 40 mg with several cc's of plain xylocaine. Anesthesia was provided by ethyl chloride and a 20-gauge needle was used to inject the hip area. The injection was tolerated well.  A band aid dressing was applied.  The patient was advised to apply ice later today and tomorrow to the injection sight as needed.  PROCEDURE NOTE:  The patient requests injections of the right knee , verbal consent was obtained.  The right knee was prepped appropriately after time out was performed.   Sterile technique was observed and injection of 1 cc of Depo-Medrol 40 mg with several cc's of plain xylocaine. Anesthesia was provided by ethyl chloride and a 20-gauge needle was used to inject the knee area. The injection was tolerated well.  A band aid dressing was applied.  The patient was advised to apply ice later today and tomorrow to the injection sight as needed.  I have reviewed the [place] web site prior to prescribing narcotic medicine for this patient.  Electronically Signed Sanjuana Kava, MD 8/15/20192:44 PM

## 2017-11-07 DIAGNOSIS — B351 Tinea unguium: Secondary | ICD-10-CM | POA: Diagnosis not present

## 2017-11-07 DIAGNOSIS — L851 Acquired keratosis [keratoderma] palmaris et plantaris: Secondary | ICD-10-CM | POA: Diagnosis not present

## 2017-11-07 DIAGNOSIS — I739 Peripheral vascular disease, unspecified: Secondary | ICD-10-CM | POA: Diagnosis not present

## 2017-11-07 DIAGNOSIS — M79674 Pain in right toe(s): Secondary | ICD-10-CM | POA: Diagnosis not present

## 2017-11-07 DIAGNOSIS — E1151 Type 2 diabetes mellitus with diabetic peripheral angiopathy without gangrene: Secondary | ICD-10-CM | POA: Diagnosis not present

## 2017-11-07 DIAGNOSIS — M79675 Pain in left toe(s): Secondary | ICD-10-CM | POA: Diagnosis not present

## 2018-01-10 ENCOUNTER — Telehealth: Payer: Self-pay | Admitting: Cardiology

## 2018-01-10 ENCOUNTER — Other Ambulatory Visit: Payer: Self-pay

## 2018-01-10 NOTE — Telephone Encounter (Signed)
Per phone call -- pt has been having shortness of breath. Scheduled her to see SM on 02/01/18

## 2018-01-10 NOTE — Telephone Encounter (Signed)
Returned pt call. She complains that she is more SOB than she has been in the past. It has been going on for several months. She is aware of her upcoming appointment with Dr. Domenic Polite. I advised her to call her pcp to see if he could see her before that appointment. I also advised her that if it got any worse she could go to the ED to be evaluated. She stated she would wait until her appt 11/20.

## 2018-01-16 DIAGNOSIS — E1151 Type 2 diabetes mellitus with diabetic peripheral angiopathy without gangrene: Secondary | ICD-10-CM | POA: Diagnosis not present

## 2018-01-16 DIAGNOSIS — I739 Peripheral vascular disease, unspecified: Secondary | ICD-10-CM | POA: Diagnosis not present

## 2018-01-16 DIAGNOSIS — B351 Tinea unguium: Secondary | ICD-10-CM | POA: Diagnosis not present

## 2018-01-16 DIAGNOSIS — M79675 Pain in left toe(s): Secondary | ICD-10-CM | POA: Diagnosis not present

## 2018-01-16 DIAGNOSIS — L851 Acquired keratosis [keratoderma] palmaris et plantaris: Secondary | ICD-10-CM | POA: Diagnosis not present

## 2018-01-16 DIAGNOSIS — M79674 Pain in right toe(s): Secondary | ICD-10-CM | POA: Diagnosis not present

## 2018-01-19 ENCOUNTER — Encounter: Payer: Self-pay | Admitting: Orthopaedic Surgery

## 2018-01-19 ENCOUNTER — Ambulatory Visit: Payer: PPO | Admitting: Orthopaedic Surgery

## 2018-01-19 DIAGNOSIS — G8929 Other chronic pain: Secondary | ICD-10-CM

## 2018-01-19 DIAGNOSIS — M25562 Pain in left knee: Secondary | ICD-10-CM

## 2018-01-19 DIAGNOSIS — M25561 Pain in right knee: Secondary | ICD-10-CM | POA: Diagnosis not present

## 2018-01-19 NOTE — Progress Notes (Signed)
CC: Both of my knees are hurting. I would like an injection in both knees.  The patient has had chronic pain and tenderness of both knees for some time.  Injections help.  There is no locking or giving way of the knee.  There is no new trauma. There is no redness or signs of infections.  The knees have a mild effusion and some crepitus.  There is no redness or signs of recent trauma.  Right knee ROM is 0-100 and left knee ROM is 0-105.  Impression:  Chronic pain of the both knees  Return:  3 months  PROCEDURE NOTE:  The patient requests injections of both knees, verbal consent was obtained.  The left and right knee were individually prepped appropriately after time out was performed.   Sterile technique was observed and injection of 1 cc of Depo-Medrol 40 mg with several cc's of plain xylocaine. Anesthesia was provided by ethyl chloride and a 20-gauge needle was used to inject each knee area. The injections were tolerated well.  A band aid dressing was applied.  The patient was advised to apply ice later today and tomorrow to the injection sight as needed.  Electronically Signed Sanjuana Kava, MD 11/7/20199:31 AM

## 2018-01-31 NOTE — Progress Notes (Signed)
Cardiology Office Note  Date: 02/01/2018   ID: Marie House, DOB 21-Dec-1934, MRN [id number]  PCP: Asencion Noble, MD  Primary Cardiologist: Rozann Lesches, MD   Chief Complaint  Patient presents with  . Coronary Artery Disease    History of Present Illness: Marie House is an 83 y.o. female last seen in September 2018.  She is here today for a follow-up visit.  She reports increasing dyspnea on exertion over the last several months, also intermittent angina and nitroglycerin use.  I have talked with her over time about follow-up ischemic testing and she has preferred to hold off.  It is likely that she has had progression in underlying ischemic heart disease, we discussed this again today.  She would like to try with medication adjustments first.  She did not tolerate Imdur previously due to headaches.  Norvasc is listed as an intolerance, although she does not recall any specifics.  I reviewed her medications.  Current cardiac regimen includes aspirin, Coreg, enalapril, HCTZ, Pravachol, and as needed nitroglycerin.  I personally reviewed her ECG today which shows normal sinus rhythm with nonspecific ST changes.  Past Medical History:  Diagnosis Date  . Arthritis   . Carotid artery disease ([place])   . CKD (chronic kidney disease) stage 3, GFR 30-59 ml/min (HCC)   . Coronary atherosclerosis of native coronary artery    Cypher DES LAD 2003, Taxus DES LAD 2004  . Essential hypertension, benign   . Mixed hyperlipidemia   . Type 2 diabetes mellitus ([place])     Past Surgical History:  Procedure Laterality Date  . CAROTID ENDARTERECTOMY Right 2004  . CATARACT EXTRACTION    . CHOLECYSTECTOMY    . Left breast mastectomy     25 yrs ago  . MASTECTOMY Left   . PARTIAL HYSTERECTOMY    . TONSILLECTOMY      Current Outpatient Medications  Medication Sig Dispense Refill  . ALPRAZolam (XANAX) 0.5 MG tablet Take 0.5 mg by mouth 3 (three) times daily as needed for anxiety.    Marland Kitchen aspirin EC  81 MG tablet Take 81 mg by mouth daily.    . carvedilol (COREG) 12.5 MG tablet Take 12.5 mg by mouth 2 (two) times daily with a meal.     . enalapril (VASOTEC) 5 MG tablet Take 5 mg by mouth daily.    . hydrochlorothiazide (HYDRODIURIL) 25 MG tablet Take 25 mg by mouth daily.    Marland Kitchen HYDROcodone-acetaminophen (NORCO/VICODIN) 5-325 MG tablet Take 1 tablet by mouth every 6 (six) hours as needed for moderate pain. 50 tablet 0  . insulin aspart (NOVOLOG) 100 UNIT/ML injection Inject 20 Units into the skin 2 (two) times daily.     . insulin glargine (LANTUS) 100 UNIT/ML injection Inject 40 Units into the skin 2 (two) times daily.     . nitroGLYCERIN (NITROSTAT) 0.4 MG SL tablet ONE TABLET UNDER TONGUE AS NEEDED FOR CHEST PAIN EVERY 5 MINUTES CONTACT MD AS DIRECTED 150 tablet 3  . NOVOLOG FLEXPEN 100 UNIT/ML FlexPen     . omeprazole (PRILOSEC) 20 MG capsule Take 20 mg by mouth daily.    . pravastatin (PRAVACHOL) 80 MG tablet Take 80 mg by mouth daily.    . traZODone (DESYREL) 50 MG tablet TK 1 T PO QHS  5  . amLODipine (NORVASC) 2.5 MG tablet Take 1 tablet (2.5 mg total) by mouth daily. 90 tablet 3   No current facility-administered medications for this visit.    Allergies:  Altace [ramipril]; Atacand [candesartan]; Avandia [rosiglitazone]; Crestor [rosuvastatin]; Diovan [valsartan]; Lisinopril; Lotensin [benazepril]; Norvasc [amlodipine besylate]; and Zetia [ezetimibe]   Social History: The patient  reports that she quit smoking about 11 years ago. Her smoking use included cigarettes. She has never used smokeless tobacco. She reports that she does not drink alcohol or use drugs.   ROS:  Please see the history of present illness. Otherwise, complete review of systems is positive for arthritic pain and stiffness.  All other systems are reviewed and negative.   Physical Exam: VS:  BP (!) 160/78 (BP Location: Right Arm)   Pulse 84   Ht 5\' 4"  (1.626 m)   Wt 211 lb (95.7 kg)   SpO2 93%   BMI 36.22  kg/m , BMI Body mass index is 36.22 kg/m.  Wt Readings from Last 3 Encounters:  02/01/18 211 lb (95.7 kg)  10/27/17 211 lb (95.7 kg)  07/27/17 211 lb (95.7 kg)    General: Elderly woman, using walker. No distress. HEENT: Conjunctiva and lids normal, oropharynx clear. Neck: Supple, no elevated JVP or carotid bruits, no thyromegaly. Lungs: Clear to auscultation, nonlabored breathing at rest. Cardiac: Regular rate and rhythm, no S3 or significant systolic murmur. Abdomen: Soft, nontender, bowel sounds present. Extremities: No pitting edema, distal pulses 2+. Skin: Warm and dry. Musculoskeletal: No kyphosis. Neuropsychiatric: Alert and oriented x3, affect grossly appropriate.  ECG: I personally reviewed the tracing from 12/03/2016 which showed sinus rhythm.  Recent Labwork:  September 2018: BUN 23, creatinine 1.67, potassium 4.5, AST 22, ALT 17, hemoglobin 11.6, platelets 229, cholesterol 171, triglycerides 265, HDL 50, LDL 68, hemoglobin A1c 8.5  Other Studies Reviewed Today:  Echocardiogram 06/12/2013: Study Conclusions  - Study data: Technically adequate study. - Left ventricle: The cavity size was normal. Wall thickness was normal. Systolic function was vigorous. The estimated ejection fraction was in the range of 65% to 70%. There is LV cavity obliteration during systole that causes a mild intracavitary gradient. Doppler parameters are consistent with abnormal left ventricular relaxation (grade 1 diastolic dysfunction). - Aortic valve: Mildly calcified annulus. Mildly thickened leaflets. Valve area: 2.95cm^2(VTI). Valve area: 3.08cm^2 (Vmax). - Mitral valve: Mildly to moderately calcified annulus. Mildly thickened leaflets . - Systemic veins: The IVC is small, consistent with low RA pressures and hypovolemia.  Assessment and Plan:  1.  Dyspnea on exertion and intermittent angina symptoms in the setting of known CAD with previous DES interventions to  the LAD in 2003 and 2004.  It is likely that she has had progressive ischemic heart disease, we have discussed follow-up ischemic testing but she wants to hold off.  She did not tolerate Imdur previously due to headaches.  We will continue current regimen and add low-dose Norvasc.  Follow-up arranged.  2.  Mixed hyperlipidemia on Pravachol.  She continues to follow with Dr. Willey Blade.  LDL was 68 last year.  Requesting interval lab work.  3.  CKD stage 3, last creatinine 1.67.  4.  Essential hypertension, blood pressure is elevated today.  Norvasc is being added also as antianginal.  Current medicines were reviewed with the patient today.   Orders Placed This Encounter  Procedures  . EKG 12-Lead    Disposition: Follow-up in 6 weeks.  Signed, Satira Sark, MD, [facility] 02/01/2018 11:25 AM    [place] [facility] [address] [place], [place] [address] Phone: [phone]; Fax: [phone]

## 2018-02-01 ENCOUNTER — Ambulatory Visit: Payer: PPO | Admitting: Cardiology

## 2018-02-01 ENCOUNTER — Encounter: Payer: Self-pay | Admitting: Cardiology

## 2018-02-01 VITALS — BP 160/78 | HR 84 | Ht 64.0 in | Wt 211.0 lb

## 2018-02-01 DIAGNOSIS — I25119 Atherosclerotic heart disease of native coronary artery with unspecified angina pectoris: Secondary | ICD-10-CM | POA: Diagnosis not present

## 2018-02-01 DIAGNOSIS — I10 Essential (primary) hypertension: Secondary | ICD-10-CM | POA: Diagnosis not present

## 2018-02-01 DIAGNOSIS — N183 Chronic kidney disease, stage 3 unspecified: Secondary | ICD-10-CM

## 2018-02-01 DIAGNOSIS — E782 Mixed hyperlipidemia: Secondary | ICD-10-CM

## 2018-02-01 MED ORDER — AMLODIPINE BESYLATE 2.5 MG PO TABS: 2.5000 mg | ORAL_TABLET | Freq: Every day | ORAL | 3 refills | Status: DC

## 2018-02-01 NOTE — Patient Instructions (Signed)
Medication Instructions:  START Norvasc 2.5 mg in the morning If you need a refill on your cardiac medications before your next appointment, please call your pharmacy.   Lab work: none If you have labs (blood work) drawn today and your tests are completely normal, you will receive your results only by: Marland Kitchen MyChart Message (if you have MyChart) OR . A paper copy in the mail If you have any lab test that is abnormal or we need to change your treatment, we will call you to review the results.  Testing/Procedures: none  Follow-Up:in 6-8 weeks

## 2018-02-13 DIAGNOSIS — I2510 Atherosclerotic heart disease of native coronary artery without angina pectoris: Secondary | ICD-10-CM | POA: Diagnosis not present

## 2018-02-13 DIAGNOSIS — Z79899 Other long term (current) drug therapy: Secondary | ICD-10-CM | POA: Diagnosis not present

## 2018-02-13 DIAGNOSIS — F419 Anxiety disorder, unspecified: Secondary | ICD-10-CM | POA: Diagnosis not present

## 2018-02-13 DIAGNOSIS — E1129 Type 2 diabetes mellitus with other diabetic kidney complication: Secondary | ICD-10-CM | POA: Diagnosis not present

## 2018-02-13 DIAGNOSIS — N184 Chronic kidney disease, stage 4 (severe): Secondary | ICD-10-CM | POA: Diagnosis not present

## 2018-02-13 DIAGNOSIS — M1990 Unspecified osteoarthritis, unspecified site: Secondary | ICD-10-CM | POA: Diagnosis not present

## 2018-02-20 DIAGNOSIS — N184 Chronic kidney disease, stage 4 (severe): Secondary | ICD-10-CM | POA: Diagnosis not present

## 2018-02-20 DIAGNOSIS — Z23 Encounter for immunization: Secondary | ICD-10-CM | POA: Diagnosis not present

## 2018-02-20 DIAGNOSIS — I25119 Atherosclerotic heart disease of native coronary artery with unspecified angina pectoris: Secondary | ICD-10-CM | POA: Diagnosis not present

## 2018-02-20 DIAGNOSIS — E785 Hyperlipidemia, unspecified: Secondary | ICD-10-CM | POA: Diagnosis not present

## 2018-02-20 DIAGNOSIS — E1122 Type 2 diabetes mellitus with diabetic chronic kidney disease: Secondary | ICD-10-CM | POA: Diagnosis not present

## 2018-02-20 DIAGNOSIS — E875 Hyperkalemia: Secondary | ICD-10-CM | POA: Diagnosis not present

## 2018-03-02 DIAGNOSIS — L570 Actinic keratosis: Secondary | ICD-10-CM | POA: Diagnosis not present

## 2018-03-02 DIAGNOSIS — X32XXXD Exposure to sunlight, subsequent encounter: Secondary | ICD-10-CM | POA: Diagnosis not present

## 2018-03-02 DIAGNOSIS — L820 Inflamed seborrheic keratosis: Secondary | ICD-10-CM | POA: Diagnosis not present

## 2018-03-17 ENCOUNTER — Ambulatory Visit: Payer: PPO | Admitting: Student

## 2018-04-06 ENCOUNTER — Ambulatory Visit: Payer: Medicare Other | Admitting: Orthopaedic Surgery

## 2018-04-06 ENCOUNTER — Encounter: Payer: Self-pay | Admitting: Orthopaedic Surgery

## 2018-04-06 VITALS — BP 157/77 | HR 80 | Ht 64.0 in | Wt 210.0 lb

## 2018-04-06 DIAGNOSIS — M25562 Pain in left knee: Secondary | ICD-10-CM

## 2018-04-06 DIAGNOSIS — L820 Inflamed seborrheic keratosis: Secondary | ICD-10-CM | POA: Diagnosis not present

## 2018-04-06 DIAGNOSIS — M25561 Pain in right knee: Secondary | ICD-10-CM

## 2018-04-06 DIAGNOSIS — G8929 Other chronic pain: Secondary | ICD-10-CM

## 2018-04-06 DIAGNOSIS — L821 Other seborrheic keratosis: Secondary | ICD-10-CM | POA: Diagnosis not present

## 2018-04-06 MED ORDER — HYDROCODONE-ACETAMINOPHEN 5-325 MG PO TABS: 1.0000 | ORAL_TABLET | Freq: Four times a day (QID) | ORAL | 0 refills | Status: DC | PRN

## 2018-04-06 NOTE — Progress Notes (Signed)
CC: Both of my knees are hurting. I would like an injection in both knees.  The patient has had chronic pain and tenderness of both knees for some time.  Injections help.  There is no locking or giving way of the knee.  There is no new trauma. There is no redness or signs of infections.  The knees have a mild effusion and some crepitus.  There is no redness or signs of recent trauma.  Right knee ROM is 0-100 and left knee ROM is 0-105.  Impression:  Chronic pain of the both knees  Return:  3 months  PROCEDURE NOTE:  The patient requests injections of both knees, verbal consent was obtained.  The left and right knee were individually prepped appropriately after time out was performed.   Sterile technique was observed and injection of 1 cc of Depo-Medrol 40 mg with several cc's of plain xylocaine. Anesthesia was provided by ethyl chloride and a 20-gauge needle was used to inject each knee area. The injections were tolerated well.  A band aid dressing was applied.  The patient was advised to apply ice later today and tomorrow to the injection sight as needed.  I have reviewed the [place] web site prior to prescribing narcotic medicine for this patient.    Electronically Signed Sanjuana Kava, MD 1/23/20209:45 AM

## 2018-04-20 ENCOUNTER — Ambulatory Visit: Payer: PPO | Admitting: Orthopaedic Surgery

## 2018-04-27 DIAGNOSIS — E113492 Type 2 diabetes mellitus with severe nonproliferative diabetic retinopathy without macular edema, left eye: Secondary | ICD-10-CM | POA: Diagnosis not present

## 2018-04-27 DIAGNOSIS — E113491 Type 2 diabetes mellitus with severe nonproliferative diabetic retinopathy without macular edema, right eye: Secondary | ICD-10-CM | POA: Diagnosis not present

## 2018-04-27 DIAGNOSIS — H353212 Exudative age-related macular degeneration, right eye, with inactive choroidal neovascularization: Secondary | ICD-10-CM | POA: Diagnosis not present

## 2018-04-27 DIAGNOSIS — H353211 Exudative age-related macular degeneration, right eye, with active choroidal neovascularization: Secondary | ICD-10-CM | POA: Diagnosis not present

## 2018-05-08 DIAGNOSIS — M79672 Pain in left foot: Secondary | ICD-10-CM | POA: Diagnosis not present

## 2018-05-08 DIAGNOSIS — M79671 Pain in right foot: Secondary | ICD-10-CM | POA: Diagnosis not present

## 2018-05-08 DIAGNOSIS — I739 Peripheral vascular disease, unspecified: Secondary | ICD-10-CM | POA: Diagnosis not present

## 2018-05-08 DIAGNOSIS — E1140 Type 2 diabetes mellitus with diabetic neuropathy, unspecified: Secondary | ICD-10-CM | POA: Diagnosis not present

## 2018-05-08 DIAGNOSIS — L110 Acquired keratosis follicularis: Secondary | ICD-10-CM | POA: Diagnosis not present

## 2018-05-17 DIAGNOSIS — N184 Chronic kidney disease, stage 4 (severe): Secondary | ICD-10-CM | POA: Diagnosis not present

## 2018-05-17 DIAGNOSIS — E1129 Type 2 diabetes mellitus with other diabetic kidney complication: Secondary | ICD-10-CM | POA: Diagnosis not present

## 2018-05-17 DIAGNOSIS — E785 Hyperlipidemia, unspecified: Secondary | ICD-10-CM | POA: Diagnosis not present

## 2018-05-17 DIAGNOSIS — E875 Hyperkalemia: Secondary | ICD-10-CM | POA: Diagnosis not present

## 2018-05-17 DIAGNOSIS — Z79899 Other long term (current) drug therapy: Secondary | ICD-10-CM | POA: Diagnosis not present

## 2018-05-23 ENCOUNTER — Telehealth: Payer: Self-pay | Admitting: Cardiology

## 2018-05-23 ENCOUNTER — Encounter: Payer: Self-pay | Admitting: *Deleted

## 2018-05-23 DIAGNOSIS — E875 Hyperkalemia: Secondary | ICD-10-CM | POA: Diagnosis not present

## 2018-05-23 DIAGNOSIS — R0609 Other forms of dyspnea: Secondary | ICD-10-CM

## 2018-05-23 DIAGNOSIS — I25119 Atherosclerotic heart disease of native coronary artery with unspecified angina pectoris: Secondary | ICD-10-CM

## 2018-05-23 DIAGNOSIS — N184 Chronic kidney disease, stage 4 (severe): Secondary | ICD-10-CM | POA: Diagnosis not present

## 2018-05-23 DIAGNOSIS — E1122 Type 2 diabetes mellitus with diabetic chronic kidney disease: Secondary | ICD-10-CM | POA: Diagnosis not present

## 2018-05-23 NOTE — Telephone Encounter (Signed)
Dr. Ria Comment office called requesting to speak with Dr. Domenic Polite in regards to a mutual patient. Please call Dr. Willey Blade at [phone].

## 2018-05-23 NOTE — Telephone Encounter (Signed)
Patient informed and verbalized understanding. Lexiscan instructions read to patient and also copy mailed to home address. Verbalized understanding.

## 2018-05-23 NOTE — Telephone Encounter (Signed)
Returned call to speak with Dr. Willey Blade about Marie House.  I saw her in November of last year at which point she was reporting dyspnea on exertion and we talked about the possibility of follow-up ischemic testing.  She has known CAD with previous DES interventions to the LAD in 2003 and 2004.  At that point she did not want to pursue further testing, Norvasc was added.  She saw Dr. Willey Blade today reporting similar symptoms and a willingness to pursue follow-up investigation.  Please have the [facility] office schedule an echocardiogram and a Lexiscan Myoview on medical therapy for evaluation of CAD with angina.  She will then need to have a follow-up APP visit to review and determine if we need to pursue further invasive testing.  She does have CKD stage III which increases her risk of contrast nephropathy somewhat, however does not report preclude a cardiac catheterization.  Satira Sark, M.D., F.A.C.C.

## 2018-06-01 ENCOUNTER — Telehealth: Payer: Self-pay | Admitting: Cardiology

## 2018-06-01 NOTE — Telephone Encounter (Signed)
Patient called stating she received instructions to have a Lexiscan test.  She does not want to have this test done

## 2018-06-01 NOTE — Telephone Encounter (Signed)
Pt explained that both echo and lexi were recommended - pt was confused on what test she was to do but is agreeable - echo has already been scheduled for 3/31 - lexi hasn't been scheduled yet but will forward to scheduling to see if can be done on the same day

## 2018-06-02 NOTE — Telephone Encounter (Signed)
All nuclear testing has been postponed until after 4/20 unless deemed "medically necessary" by physician. Will you please check with MD to see the necessity of this test? / tg

## 2018-06-02 NOTE — Telephone Encounter (Signed)
LM for pt to returncall

## 2018-06-02 NOTE — Telephone Encounter (Signed)
   If no new symptoms, would postpone testing (both echocardiogram and NST) until after 07/03/2018. She should report back in the interim if any change in symptoms.   Signed, Erma Heritage, PA-C 06/02/2018, 8:26 AM Pager: [phone]

## 2018-06-06 ENCOUNTER — Telehealth: Payer: Self-pay | Admitting: Orthopaedic Surgery

## 2018-06-06 NOTE — Telephone Encounter (Signed)
Patient is wanting to come in and get an injection.  Because of the changes in the office when do you want her to come in for this?  Please advise

## 2018-06-06 NOTE — Telephone Encounter (Signed)
Wednesday,

## 2018-06-07 ENCOUNTER — Other Ambulatory Visit: Payer: Self-pay

## 2018-06-07 ENCOUNTER — Encounter: Payer: Self-pay | Admitting: Orthopaedic Surgery

## 2018-06-07 ENCOUNTER — Ambulatory Visit: Payer: Medicare Other | Admitting: Orthopaedic Surgery

## 2018-06-07 DIAGNOSIS — G8929 Other chronic pain: Secondary | ICD-10-CM

## 2018-06-07 DIAGNOSIS — M25562 Pain in left knee: Secondary | ICD-10-CM

## 2018-06-07 DIAGNOSIS — M25561 Pain in right knee: Secondary | ICD-10-CM

## 2018-06-07 MED ORDER — HYDROCODONE-ACETAMINOPHEN 5-325 MG PO TABS: 1.0000 | ORAL_TABLET | Freq: Four times a day (QID) | ORAL | 0 refills | Status: DC | PRN

## 2018-06-07 NOTE — Progress Notes (Signed)
CC: Both of my knees are hurting. I would like an injection in both knees.  The patient has had chronic pain and tenderness of both knees for some time.  Injections help.  There is no locking or giving way of the knee.  There is no new trauma. There is no redness or signs of infections.  The knees have a mild effusion and some crepitus.  There is no redness or signs of recent trauma.  Right knee ROM is 0-100 and left knee ROM is 0-105.  Impression:  Chronic pain of the both knees  Return:  prn  PROCEDURE NOTE:  The patient requests injections of both knees, verbal consent was obtained.  The left and right knee were individually prepped appropriately after time out was performed.   Sterile technique was observed and injection of 1 cc of Depo-Medrol 40 mg with several cc's of plain xylocaine. Anesthesia was provided by ethyl chloride and a 20-gauge needle was used to inject each knee area. The injections were tolerated well.  A band aid dressing was applied.  The patient was advised to apply ice later today and tomorrow to the injection sight as needed.   I have reviewed the [facility] web site prior to prescribing narcotic medicine for this patient.   Electronically Signed Sanjuana Kava, MD 3/25/20209:03 AM

## 2018-06-13 ENCOUNTER — Other Ambulatory Visit (HOSPITAL_COMMUNITY): Payer: Medicare Other

## 2018-07-04 ENCOUNTER — Ambulatory Visit: Payer: Medicare Other | Admitting: Orthopaedic Surgery

## 2018-07-05 ENCOUNTER — Other Ambulatory Visit: Payer: Self-pay

## 2018-07-05 ENCOUNTER — Encounter: Payer: Self-pay | Admitting: Orthopaedic Surgery

## 2018-07-05 ENCOUNTER — Ambulatory Visit: Payer: Medicare Other | Admitting: Orthopaedic Surgery

## 2018-07-05 VITALS — Temp 98.7°F

## 2018-07-05 DIAGNOSIS — M25562 Pain in left knee: Secondary | ICD-10-CM

## 2018-07-05 DIAGNOSIS — G8929 Other chronic pain: Secondary | ICD-10-CM | POA: Diagnosis not present

## 2018-07-05 DIAGNOSIS — M25561 Pain in right knee: Secondary | ICD-10-CM | POA: Diagnosis not present

## 2018-07-05 NOTE — Progress Notes (Signed)
CC: Both of my knees are hurting. I would like an injection in both knees.  The patient has had chronic pain and tenderness of both knees for some time.  Injections help.  There is no locking or giving way of the knee.  There is no new trauma. There is no redness or signs of infections.  The knees have a mild effusion and some crepitus.  There is no redness or signs of recent trauma.  Right knee ROM is 0-105 and left knee ROM is 0-100.  Impression:  Chronic pain of the both knees  Return:  1 month  PROCEDURE NOTE:  The patient requests injections of both knees, verbal consent was obtained.  The left and right knee were individually prepped appropriately after time out was performed.   Sterile technique was observed and injection of 1 cc of Depo-Medrol 40 mg with several cc's of plain xylocaine. Anesthesia was provided by ethyl chloride and a 20-gauge needle was used to inject each knee area. The injections were tolerated well.  A band aid dressing was applied.  The patient was advised to apply ice later today and tomorrow to the injection sight as needed.  Electronically Signed Sanjuana Kava, MD 4/22/20209:19 AM

## 2018-07-27 ENCOUNTER — Other Ambulatory Visit (HOSPITAL_COMMUNITY): Payer: Medicare Other

## 2018-07-27 ENCOUNTER — Ambulatory Visit (HOSPITAL_COMMUNITY)
Admission: RE | Admit: 2018-07-27 | Discharge: 2018-07-27 | Disposition: A | Discharge status: Home / Self Care | Payer: Medicare Other | Source: Ambulatory Visit | Attending: Cardiology | Admitting: Cardiology

## 2018-07-27 ENCOUNTER — Other Ambulatory Visit: Payer: Self-pay

## 2018-07-27 DIAGNOSIS — I25119 Atherosclerotic heart disease of native coronary artery with unspecified angina pectoris: Secondary | ICD-10-CM

## 2018-07-27 DIAGNOSIS — R0609 Other forms of dyspnea: Secondary | ICD-10-CM

## 2018-07-27 NOTE — Progress Notes (Signed)
*  PRELIMINARY RESULTS* Echocardiogram 2D Echocardiogram has been performed.  Leavy Cella 07/27/2018, 3:14 PM

## 2018-08-02 ENCOUNTER — Ambulatory Visit: Payer: Self-pay | Admitting: Orthopaedic Surgery

## 2018-08-03 ENCOUNTER — Other Ambulatory Visit: Payer: Self-pay

## 2018-08-03 ENCOUNTER — Ambulatory Visit: Payer: Medicare Other | Admitting: Orthopaedic Surgery

## 2018-08-03 ENCOUNTER — Encounter: Payer: Self-pay | Admitting: Orthopaedic Surgery

## 2018-08-03 VITALS — Temp 97.1°F

## 2018-08-03 DIAGNOSIS — M25562 Pain in left knee: Secondary | ICD-10-CM

## 2018-08-03 DIAGNOSIS — M25561 Pain in right knee: Secondary | ICD-10-CM

## 2018-08-03 DIAGNOSIS — G8929 Other chronic pain: Secondary | ICD-10-CM

## 2018-08-03 NOTE — Progress Notes (Signed)
CC: Both of my knees are hurting. I would like an injection in both knees.  The patient has had chronic pain and tenderness of both knees for some time.  Injections help.  There is no locking or giving way of the knee.  There is no new trauma. There is no redness or signs of infections.  The knees have a mild effusion and some crepitus.  There is no redness or signs of recent trauma.  Right knee ROM is 0-105 and left knee ROM is 0-110.  Impression:  Chronic pain of the both knees  Return:  prn  PROCEDURE NOTE:  The patient requests injections of both knees, verbal consent was obtained.  The left and right knee were individually prepped appropriately after time out was performed.   Sterile technique was observed and injection of 1 cc of Depo-Medrol 40 mg with several cc's of plain xylocaine. Anesthesia was provided by ethyl chloride and a 20-gauge needle was used to inject each knee area. The injections were tolerated well.  A band aid dressing was applied.  The patient was advised to apply ice later today and tomorrow to the injection sight as needed.   Electronically Signed Sanjuana Kava, MD 5/21/20209:46 AM

## 2018-08-10 DIAGNOSIS — H353212 Exudative age-related macular degeneration, right eye, with inactive choroidal neovascularization: Secondary | ICD-10-CM | POA: Diagnosis not present

## 2018-08-10 DIAGNOSIS — E113492 Type 2 diabetes mellitus with severe nonproliferative diabetic retinopathy without macular edema, left eye: Secondary | ICD-10-CM | POA: Diagnosis not present

## 2018-08-10 DIAGNOSIS — E113491 Type 2 diabetes mellitus with severe nonproliferative diabetic retinopathy without macular edema, right eye: Secondary | ICD-10-CM | POA: Diagnosis not present

## 2018-08-10 DIAGNOSIS — H353222 Exudative age-related macular degeneration, left eye, with inactive choroidal neovascularization: Secondary | ICD-10-CM | POA: Diagnosis not present

## 2018-08-22 DIAGNOSIS — E1129 Type 2 diabetes mellitus with other diabetic kidney complication: Secondary | ICD-10-CM | POA: Diagnosis not present

## 2018-08-22 DIAGNOSIS — Z79899 Other long term (current) drug therapy: Secondary | ICD-10-CM | POA: Diagnosis not present

## 2018-08-22 DIAGNOSIS — N184 Chronic kidney disease, stage 4 (severe): Secondary | ICD-10-CM | POA: Diagnosis not present

## 2018-08-29 DIAGNOSIS — E1122 Type 2 diabetes mellitus with diabetic chronic kidney disease: Secondary | ICD-10-CM | POA: Diagnosis not present

## 2018-08-29 DIAGNOSIS — E875 Hyperkalemia: Secondary | ICD-10-CM | POA: Diagnosis not present

## 2018-08-29 DIAGNOSIS — I2510 Atherosclerotic heart disease of native coronary artery without angina pectoris: Secondary | ICD-10-CM | POA: Diagnosis not present

## 2018-08-29 DIAGNOSIS — N184 Chronic kidney disease, stage 4 (severe): Secondary | ICD-10-CM | POA: Diagnosis not present

## 2018-09-11 DIAGNOSIS — D692 Other nonthrombocytopenic purpura: Secondary | ICD-10-CM | POA: Diagnosis not present

## 2018-09-11 DIAGNOSIS — R609 Edema, unspecified: Secondary | ICD-10-CM | POA: Diagnosis not present

## 2018-09-15 ENCOUNTER — Emergency Department (HOSPITAL_COMMUNITY)
Admission: EM | Admit: 2018-09-15 | Discharge: 2018-09-16 | Disposition: A | Discharge status: Home / Self Care | Payer: Medicare Other | Attending: Emergency Medicine | Admitting: Emergency Medicine

## 2018-09-15 ENCOUNTER — Other Ambulatory Visit: Payer: Self-pay

## 2018-09-15 ENCOUNTER — Emergency Department (HOSPITAL_COMMUNITY): Payer: Medicare Other

## 2018-09-15 ENCOUNTER — Encounter (HOSPITAL_COMMUNITY): Payer: Self-pay | Admitting: Emergency Medicine

## 2018-09-15 DIAGNOSIS — Z20828 Contact with and (suspected) exposure to other viral communicable diseases: Secondary | ICD-10-CM | POA: Diagnosis not present

## 2018-09-15 DIAGNOSIS — R0602 Shortness of breath: Secondary | ICD-10-CM | POA: Diagnosis not present

## 2018-09-15 DIAGNOSIS — J40 Bronchitis, not specified as acute or chronic: Secondary | ICD-10-CM | POA: Insufficient documentation

## 2018-09-15 LAB — COMPREHENSIVE METABOLIC PANEL
ALT: 19 U/L (ref 0–44)
AST: 23 U/L (ref 15–41)
Albumin: 3.8 g/dL (ref 3.5–5.0)
Alkaline Phosphatase: 98 U/L (ref 38–126)
Anion gap: 11 (ref 5–15)
BUN: 22 mg/dL (ref 8–23)
CO2: 30 mmol/L (ref 22–32)
Calcium: 9.2 mg/dL (ref 8.9–10.3)
Chloride: 96 mmol/L — ABNORMAL LOW (ref 98–111)
Creatinine, Ser: 1.66 mg/dL — ABNORMAL HIGH (ref 0.44–1.00)
GFR calc Af Amer: 33 mL/min — ABNORMAL LOW (ref >60)
GFR calc non Af Amer: 29 mL/min — ABNORMAL LOW (ref >60)
Glucose, Bld: 233 mg/dL — ABNORMAL HIGH (ref 70–99)
Potassium: 4.4 mmol/L (ref 3.5–5.1)
Sodium: 137 mmol/L (ref 135–145)
Total Bilirubin: 0.2 mg/dL — ABNORMAL LOW (ref 0.3–1.2)
Total Protein: 6.8 g/dL (ref 6.5–8.1)

## 2018-09-15 LAB — CBC WITH DIFFERENTIAL/PLATELET
Abs Immature Granulocytes: 0.03 10*3/uL (ref 0.00–0.07)
Basophils Absolute: 0 10*3/uL (ref 0.0–0.1)
Basophils Relative: 0 %
Eosinophils Absolute: 0.1 10*3/uL (ref 0.0–0.5)
Eosinophils Relative: 1 %
HCT: 33.8 % — ABNORMAL LOW (ref 36.0–46.0)
Hemoglobin: 10.2 g/dL — ABNORMAL LOW (ref 12.0–15.0)
Immature Granulocytes: 0 %
Lymphocytes Relative: 28 %
Lymphs Abs: 2.9 10*3/uL (ref 0.7–4.0)
MCH: 27.7 pg (ref 26.0–34.0)
MCHC: 30.2 g/dL (ref 30.0–36.0)
MCV: 91.8 fL (ref 80.0–100.0)
Monocytes Absolute: 0.9 10*3/uL (ref 0.1–1.0)
Monocytes Relative: 9 %
Neutro Abs: 6.5 10*3/uL (ref 1.7–7.7)
Neutrophils Relative %: 62 %
Platelets: 285 10*3/uL (ref 150–400)
RBC: 3.68 MIL/uL — ABNORMAL LOW (ref 3.87–5.11)
RDW: 14.2 % (ref 11.5–15.5)
WBC: 10.4 10*3/uL (ref 4.0–10.5)
nRBC: 0 % (ref 0.0–0.2)

## 2018-09-15 LAB — TROPONIN I (HIGH SENSITIVITY)
Troponin I (High Sensitivity): 5 ng/L (ref <18)
Troponin I (High Sensitivity): 5 ng/L (ref <18)

## 2018-09-15 LAB — SARS CORONAVIRUS 2 BY RT PCR (HOSPITAL ORDER, PERFORMED IN ~~LOC~~ HOSPITAL LAB): SARS Coronavirus 2: NEGATIVE

## 2018-09-15 MED ORDER — PREDNISONE 50 MG PO TABS
60.0000 mg | ORAL_TABLET | Freq: Once | ORAL | Status: AC
  Administered 2018-09-15: 60 mg via ORAL
  Filled 2018-09-15: qty 1

## 2018-09-15 MED ORDER — IPRATROPIUM-ALBUTEROL 0.5-2.5 (3) MG/3ML IN SOLN
3.0000 mL | Freq: Once | RESPIRATORY_TRACT | Status: AC
  Administered 2018-09-15: 3 mL via RESPIRATORY_TRACT
  Filled 2018-09-15: qty 3

## 2018-09-15 NOTE — ED Triage Notes (Signed)
Patient reports increasing SOB x 2 days.

## 2018-09-15 NOTE — ED Provider Notes (Signed)
[facility] EMERGENCY DEPARTMENT Provider Note   CSN: [id number] Arrival date & time: 09/15/18  1801    History   Chief Complaint Chief Complaint  Patient presents with  . Shortness of Breath    HPI Marie House is a 84 y.o. female.     HPI   She presents for evaluation of shortness of breath with discomfort in her lower anterior chest, difficulty sleeping, cough, occasional sputum production, and  weakness for several days.  Arrives by EMS.  She lives with her sister, and states she has been self quarantine in several weeks.  She does not have any associates who have Covid-19.  Denies headache, nausea, vomiting, dysuria, diarrhea or constipation.  She is taking her usual medications.  There are no other known modifying factors.  Past Medical History:  Diagnosis Date  . Arthritis   . Carotid artery disease ([place])   . CKD (chronic kidney disease) stage 3, GFR 30-59 ml/min (HCC)   . Coronary atherosclerosis of native coronary artery    Cypher DES LAD 2003, Taxus DES LAD 2004  . Essential hypertension, benign   . Mixed hyperlipidemia   . Type 2 diabetes mellitus [facility])     Patient Active Problem List   Diagnosis Date Noted  . Coronary atherosclerosis of native coronary artery 06/07/2013  . Carotid artery occlusion 06/07/2013  . CKD (chronic kidney disease) stage 3, GFR 30-59 ml/min (HCC) 06/06/2013  . Type 2 diabetes mellitus ([place]) 06/06/2013  . Mixed hyperlipidemia 06/06/2013  . Essential hypertension, benign 06/06/2013    Past Surgical History:  Procedure Laterality Date  . CAROTID ENDARTERECTOMY Right 2004  . CATARACT EXTRACTION    . CHOLECYSTECTOMY    . Left breast mastectomy     25 yrs ago  . MASTECTOMY Left   . PARTIAL HYSTERECTOMY    . TONSILLECTOMY       OB History    Gravida  3   Para  3   Term  3   Preterm      AB      Living  1     SAB      TAB      Ectopic      Multiple      Live Births               Home Medications     Prior to Admission medications   Medication Sig Start Date End Date Taking? Authorizing Provider  ALPRAZolam Duanne Moron) 0.5 MG tablet Take 0.5 mg by mouth 3 (three) times daily as needed for anxiety.   Yes [provider]  aspirin EC 81 MG tablet Take 81 mg by mouth daily.   Yes [provider]  carvedilol (COREG) 12.5 MG tablet Take 12.5 mg by mouth 2 (two) times daily with a meal.    Yes [provider]  HUMALOG KWIKPEN 100 UNIT/ML KwikPen Inject 25 Units into the skin 2 (two) times a day.  07/26/18  Yes [provider]  hydrochlorothiazide (HYDRODIURIL) 25 MG tablet Take 25 mg by mouth daily.   Yes [provider]  insulin glargine (LANTUS) 100 UNIT/ML injection Inject 55 Units into the skin 2 (two) times daily.    Yes [provider]  nitroGLYCERIN (NITROSTAT) 0.4 MG SL tablet ONE TABLET UNDER TONGUE AS NEEDED FOR CHEST PAIN EVERY 5 MINUTES CONTACT MD AS DIRECTED 12/03/16  Yes Satira Sark, MD  omeprazole (PRILOSEC) 20 MG capsule Take 20 mg by mouth daily.  Yes [provider]  pravastatin (PRAVACHOL) 80 MG tablet Take 80 mg by mouth daily.   Yes [provider]    Family History Family History  Problem Relation Age of Onset  . CAD Mother     Social History Social History   Tobacco Use  . Smoking status: Former Smoker    Types: Cigarettes    Quit date: 03/15/2006    Years since quitting: 12.5  . Smokeless tobacco: Never Used  Substance Use Topics  . Alcohol use: No    Alcohol/week: 0.0 standard drinks  . Drug use: No     Allergies   Altace [ramipril], Atacand [candesartan], Avandia [rosiglitazone], Crestor [rosuvastatin], Diovan [valsartan], Lisinopril, Lotensin [benazepril], Norvasc [amlodipine besylate], and Zetia [ezetimibe]   Review of Systems Review of Systems  All other systems reviewed and are negative.    Physical Exam Updated Vital Signs BP (!) 142/63   Pulse 79   Temp 98.6 F (37  C) (Oral)   Resp 18   Ht 5\' 5"  (1.651 m)   Wt 90.7 kg   SpO2 95%   BMI 33.28 kg/m   Physical Exam Vitals signs and nursing note reviewed.  Constitutional:      General: She is not in acute distress.    Appearance: She is well-developed. She is obese. She is not ill-appearing, toxic-appearing or diaphoretic.  HENT:     Head: Normocephalic and atraumatic.     Right Ear: External ear normal.     Left Ear: External ear normal.     Mouth/Throat:     Mouth: Mucous membranes are moist.     Pharynx: No oropharyngeal exudate or posterior oropharyngeal erythema.  Eyes:     Conjunctiva/sclera: Conjunctivae normal.     Pupils: Pupils are equal, round, and reactive to light.  Neck:     Musculoskeletal: Normal range of motion and neck supple.     Trachea: Phonation normal.  Cardiovascular:     Rate and Rhythm: Normal rate and regular rhythm.  Pulmonary:     Effort: Pulmonary effort is normal. No respiratory distress.     Breath sounds: Normal breath sounds. No stridor. No rhonchi.     Comments: Respiratory rate high Chest:     Chest wall: No tenderness.  Abdominal:     General: There is no distension.     Palpations: Abdomen is soft.     Tenderness: There is no abdominal tenderness. There is no guarding.  Musculoskeletal: Normal range of motion.        General: No swelling or tenderness.  Skin:    General: Skin is warm and dry.  Neurological:     Mental Status: She is alert and oriented to person, place, and time.     Motor: No abnormal muscle tone.  Psychiatric:        Mood and Affect: Mood normal.        Behavior: Behavior normal.        Thought Content: Thought content normal.        Judgment: Judgment normal.      ED Treatments / Results  Labs (all labs ordered are listed, but only abnormal results are displayed) Labs Reviewed  COMPREHENSIVE METABOLIC PANEL - Abnormal; Notable for the following components:      Result Value   Chloride 96 (*)    Glucose, Bld 233 (*)     Creatinine, Ser 1.66 (*)    Total Bilirubin 0.2 (*)    GFR calc non Af  Amer 29 (*)    GFR calc Af Amer 33 (*)    All other components within normal limits  CBC WITH DIFFERENTIAL/PLATELET - Abnormal; Notable for the following components:   RBC 3.68 (*)    Hemoglobin 10.2 (*)    HCT 33.8 (*)    All other components within normal limits  SARS CORONAVIRUS 2 (HOSPITAL ORDER, [facility])  TROPONIN I (HIGH SENSITIVITY)  TROPONIN I (HIGH SENSITIVITY)    EKG EKG Interpretation  Date/Time:  Friday September 15 2018 18:12:33 EDT Ventricular Rate:  86 PR Interval:    QRS Duration: 73 QT Interval:  374 QTC Calculation: 448 R Axis:   43 Text Interpretation:  Sinus rhythm Abnormal R-wave progression, early transition since last tracing no significant change Confirmed by Daleen Bo [phone]) on 09/15/2018 6:18:01 PM   Radiology Dg Chest 2 View  Result Date: 09/15/2018 CLINICAL DATA:  Increasing shortness of breath for 2 days EXAM: CHEST - 2 VIEW COMPARISON:  Radiograph 07/07/2017 FINDINGS: The cardiomediastinal contours are unchanged with borderline cardiomegaly. Pulmonary vasculature is normal. No consolidation, pleural effusion, or pneumothorax. Exaggerated thoracic kyphosis as before. No acute osseous abnormalities are seen. Surgical clips in the left axilla. IMPRESSION: No acute chest findings. Electronically Signed   By: Keith Rake M.D.   On: 09/15/2018 18:56    Procedures Procedures (including critical care time)  Medications Ordered in ED Medications - No data to display   Initial Impression / Assessment and Plan / ED Course  I have reviewed the triage vital signs and the nursing notes.  Pertinent labs & imaging results that were available during my care of the patient were reviewed by me and considered in my medical decision making (see chart for details).  Clinical Course as of Sep 14 2224  Fri Sep 15, 2018  2001 Normal except hemoglobin low   CBC with Differential(!) [EW]  2001 Normal  SARS Coronavirus 2 (CEPHEID- Performed in [facility] lab), [facility] [EW]  2001 No infiltrate or CHF, images reviewed by me  DG Chest 2 View [EW]  2224 Normal  Troponin I (High Sensitivity) [EW]  2224 Normal except chloride low, glucose high, creatinine high, total bilirubin low, GFR low  Comprehensive metabolic panel(!) [EW]    Clinical Course User Index [EW] Daleen Bo, MD        Patient Vitals for the past 24 hrs:  BP Temp Temp src Pulse Resp SpO2 Height Weight  09/15/18 2130 (!) 142/63 - - 79 18 95 % - -  09/15/18 2100 (!) 144/56 - - 78 (!) 23 95 % - -  09/15/18 2030 (!) 151/61 - - 81 19 95 % - -  09/15/18 1905 (!) 159/62 - - 84 (!) 23 95 % - -  09/15/18 1830 (!) 158/64 - - - (!) 24 - - -  09/15/18 1816 - 98.6 F (37 C) Oral - - - - -  09/15/18 1810 (!) 188/88 - - 85 (!) 22 95 % - -  09/15/18 1808 - - - - - - 5\' 5"  (1.651 m) 90.7 kg    10:26 PM- Reevaluation with update and discussion. After initial assessment and treatment, an updated evaluation reveals she states that when she walked to the bathroom she got short of breath and had to "pain."  This time the lungs notable for decreased air movement bilaterally, lower, with a few scattered rhonchi but no wheezes.  Nebulizer and prednisone ordered. Daleen Bo  Medical Decision Making: Patient presents with respiratory symptoms for several days, without clear clinical scenario.  Possible bronchitis as etiology.  Screening evaluation done is reassuring.  She has a history of chronic renal disease, creatinine stable.  History of diabetes, with mildly elevated blood sugar.  Screening for acute coronary processes are negative.  Doubt pneumonia, PE or impending vascular collapse.  CRITICAL CARE-no Performed by: Daleen Bo  Nursing Notes Reviewed/ Care Coordinated Applicable Imaging Reviewed Interpretation of Laboratory Data incorporated into ED treatment  The  patient appears reasonably screened and/or stabilized for discharge and I doubt any other medical condition or other [facility] requiring further screening, evaluation, or treatment in the ED at this time prior to discharge.  Plan: Home Medications- usual; Home Treatments- rest, fluids; return here if the recommended treatment, does not improve the symptoms; Recommended follow up- PCP 1 week and prn   Final Clinical Impressions(s) / ED Diagnoses   Final diagnoses:  Bronchitis  Shortness of breath    ED Discharge Orders         Ordered    predniSONE (DELTASONE) 20 MG tablet  Daily     09/16/18 0048    azithromycin (ZITHROMAX Z-PAK) 250 MG tablet     09/16/18 0048           Daleen Bo, MD 09/19/18 [phone]

## 2018-09-16 MED ORDER — PREDNISONE 20 MG PO TABS: 20.0000 mg | ORAL_TABLET | Freq: Every day | ORAL | 0 refills | Status: DC

## 2018-09-16 MED ORDER — ALBUTEROL SULFATE HFA 108 (90 BASE) MCG/ACT IN AERS
2.0000 | INHALATION_SPRAY | RESPIRATORY_TRACT | Status: DC | PRN
  Administered 2018-09-16: 01:00:00 2 via RESPIRATORY_TRACT
  Filled 2018-09-16: qty 6.7

## 2018-09-16 MED ORDER — AEROCHAMBER Z-STAT PLUS/MEDIUM MISC
1.0000 | Freq: Once | Status: AC
  Administered 2018-09-16: 1

## 2018-09-16 MED ORDER — AZITHROMYCIN 250 MG PO TABS: ORAL_TABLET | ORAL | 0 refills | Status: DC

## 2018-09-16 NOTE — ED Provider Notes (Signed)
Patient was left at change of shift to see if she was ready to be discharged after getting an albuterol nebulizer.  Patient states she was a former smoker.  She denies having any respiratory problems in the past such as asthma or COPD.  She states she has never used an inhaler before.  She states she feels short of breath and does not really have a cough, she states she clears her throat.  At the time of my exam her pulse ox was 94% on room air.  On lung exam I do not hear any wheezing, rhonchi, or rales.  Patient states she feels much better after the albuterol nebulizer treatment.  She feels like she would like to try to go home.  She was given an albuterol inhaler and a spacer and was instructed on how to use it.  Her COVID rapid test was negative.   Dg Chest 2 View  Result Date: 09/15/2018 CLINICAL DATA:  Increasing shortness of breath for 2 days EXAM: CHEST - 2 VIEW COMPARISON:  Radiograph 07/07/2017 FINDINGS: The cardiomediastinal contours are unchanged with borderline cardiomegaly. Pulmonary vasculature is normal. No consolidation, pleural effusion, or pneumothorax. Exaggerated thoracic kyphosis as before. No acute osseous abnormalities are seen. Surgical clips in the left axilla. IMPRESSION: No acute chest findings. Electronically Signed   By: Keith Rake M.D.   On: 09/15/2018 18:56   Diagnoses that have been ruled out:  None  Diagnoses that are still under consideration:  None  Final diagnoses:  Bronchitis  Shortness of breath     ED Discharge Orders         Ordered    predniSONE (DELTASONE) 20 MG tablet  Daily     09/16/18 0048    azithromycin (ZITHROMAX Z-PAK) 250 MG tablet     09/16/18 0048         Plan discharge    Rolland Porter, MD, Barbette Or, MD 09/16/18 [phone]

## 2018-09-16 NOTE — Discharge Instructions (Addendum)
Take the prednisone as prescribed, please watch your diabetic diet very carefully while on it, it can make your blood sugars get very high.  Please monitor your blood sugars closely until the prednisone is finished.  Take the antibiotic until gone.  Use the inhaler with the spacer every 4 hours as needed for shortness of breath.  Return to the emergency department if your breathing gets worse and does not improve with the medication, or you get a fever.

## 2018-09-20 ENCOUNTER — Other Ambulatory Visit: Payer: Self-pay

## 2018-09-20 NOTE — Patient Outreach (Signed)
[facility]) Care Management  09/20/2018  Marie House 1934/06/22 [id number]   Telephone Screen  Referral Date: 09/20/2018 Referral Source: [facility] Dept. Referral Reason: " [employer] member- needs co pay assistance with Lantus and Humalog" Insurance: [employer] Medicare   Outreach attempt # 1 to patient. Spoke with patient and screening completed. She voices that she resides in her home along with her sister. Patient reports that she is independent with ADLs/IADLs. She denies any issues with transportation. She voices no recent falls.  Conditions: Per chart review, patient has PMH of arthritis,carotid artery disease, CKD stage 3, HTN, HLD and DM. Patient voices that she checks her blood sugars about once a day. She has already checked cbg this am was reading of 78 obtained. She voices that she is adhering to diet restrictions. Per records, last A1C was 8.1(June 2002). RN CM offered RN Health Coach services for further disease mgmt/education and support but patient declined.   Medications: Per patient report, she is taking about four pills along with her insulins-Lantus and Humalog. She reports that she is having trouble paying for both insulins. She is unable to recall how much they ae costing her but states "too much money." She reports that she has both insulins in the home but will be running out of them soon. She has not advised PCP that she is having trouble affording meds. RN CM encouraged patient to do os as well. She is agreeable to [facility] referral for possible med assistance.   Appointments: Patient states she saw PCP about a week ago.   Consent: [facility] services reviewed and discussed with patient. Verbal consent for services given.    Plan: RN CM will send [facility] pharmacy referral for possible med assistance.    Enzo Montgomery, RN,BSN,CCM [place] Management Telephonic Care Management Coordinator Direct Phone: [phone] Toll Free: [phone] Fax:  [phone]

## 2018-09-26 ENCOUNTER — Ambulatory Visit: Payer: Medicare Other | Admitting: Orthopaedic Surgery

## 2018-09-26 ENCOUNTER — Other Ambulatory Visit: Payer: Self-pay

## 2018-09-26 ENCOUNTER — Encounter: Payer: Self-pay | Admitting: Orthopaedic Surgery

## 2018-09-26 VITALS — Temp 98.2°F

## 2018-09-26 DIAGNOSIS — M25561 Pain in right knee: Secondary | ICD-10-CM

## 2018-09-26 DIAGNOSIS — M25562 Pain in left knee: Secondary | ICD-10-CM

## 2018-09-26 DIAGNOSIS — G8929 Other chronic pain: Secondary | ICD-10-CM | POA: Diagnosis not present

## 2018-09-26 MED ORDER — HYDROCODONE-ACETAMINOPHEN 5-325 MG PO TABS: ORAL_TABLET | ORAL | 0 refills | Status: DC

## 2018-09-26 NOTE — Progress Notes (Signed)
CC: Both of my knees are hurting. I would like an injection in both knees.  The patient has had chronic pain and tenderness of both knees for some time.  Injections help.  There is no locking or giving way of the knee.  There is no new trauma. There is no redness or signs of infections.  The knees have a mild effusion and some crepitus.  There is no redness or signs of recent trauma.  Right knee ROM is 0-105 and left knee ROM is 0-110.  Impression:  Chronic pain of the both knees  Return:  as needed  PROCEDURE NOTE:  The patient requests injections of both knees, verbal consent was obtained.  The left and right knee were individually prepped appropriately after time out was performed.   Sterile technique was observed and injection of 1 cc of Depo-Medrol 40 mg with several cc's of plain xylocaine. Anesthesia was provided by ethyl chloride and a 20-gauge needle was used to inject each knee area. The injections were tolerated well.  A band aid dressing was applied.  The patient was advised to apply ice later today and tomorrow to the injection sight as needed.    Electronically Signed Sanjuana Kava, MD 7/14/20209:38 AM

## 2018-09-29 ENCOUNTER — Other Ambulatory Visit: Payer: Self-pay | Admitting: Pharmacist

## 2018-09-29 ENCOUNTER — Ambulatory Visit: Payer: Self-pay | Admitting: Pharmacist

## 2018-09-29 DIAGNOSIS — J209 Acute bronchitis, unspecified: Secondary | ICD-10-CM | POA: Diagnosis not present

## 2018-09-29 NOTE — Patient Outreach (Signed)
[facility]) Care Management  [place]  09/29/2018  NECIE WILCOXSON [birth year] [id number]   Reason for referral: Medication Assistance with insulin  Referral source: [facility] RN Current insurance: [facility]  Outreach:  Unsuccessful telephone call attempt #1 to patient.   HIPAA compliant voicemail left requesting a return call  Plan:  -I will make another outreach attempt to patient within 3-4 business days.    Regina Eck, PharmD, [place]  [phone]

## 2018-10-02 ENCOUNTER — Other Ambulatory Visit: Payer: Self-pay | Admitting: Pharmacy Technician

## 2018-10-02 ENCOUNTER — Other Ambulatory Visit: Payer: Self-pay | Admitting: Pharmacist

## 2018-10-02 ENCOUNTER — Ambulatory Visit: Payer: Self-pay | Admitting: Pharmacist

## 2018-10-02 NOTE — Patient Outreach (Signed)
[facility]) Care Management  10/02/2018  Marie House 12-May-1934 [id number]                          Medication Assistance Referral  Referral From: [facility] Jenne Pane.  Medication/Company: Humalog and Basaglar / [employer] Patient application portion:  Education officer, museum portion: Faxed  to Dr. Willey Blade    Follow up:  Will follow up with patient in 7-10 business days to confirm application(s) have been received.  Maud Deed Chana Bode [place] Certified Pharmacy Technician [place] Management Direct Dial:[phone]

## 2018-10-02 NOTE — Patient Outreach (Signed)
[place] [facility]) Care Management  [place]  10/02/2018  XANDREA CLAREY 01-08-1935 [id number]   Reason for referral: Medication Assistance  Referral source: [facility] RN Current insurance: [facility]   Outreach:  Unsuccessful telephone call attempt #1 to patient.   HIPAA compliant voicemail left requesting a return call  Plan:  -I will make another outreach attempt to patient within 3-4 business days.    Regina Eck, PharmD, [place]  [phone]

## 2018-10-05 NOTE — Patient Outreach (Signed)
[place] [facility]) Care Management  [place]   10/05/2018  Marie House Dec 01, 1934 [id number]  Reason for referral: Medication Assistance  Referral source: [facility] RN Current insurance: [facility]  PMHx includes but not limited to:  T2DM, HTN, HLD, CAD  Outreach:  Successful telephone call with Ms. Zynda.  HIPAA identifiers verified.  Patient is agreeable to review medications telephonically.  Comprehensive medication review performed.  She reports 85% compliance with medications.   She denies adverse events.  She is struggling to pay for her insulin since she is in the coverage gap.  Patient agreeable to participate in patient assistance program through [employer] to obtain free Basaglar and Humalog PENS.  After knowing that she will have financial assistance, she is motivated to continue taking her medications as prescribed.  . Diabetes: T2DM; most recent A1c 8.1% on 6.9.20  . Current antihyperglycemic regimen: Lantus, Humalog BID . Denies hypoglycemic symptoms, including dizziness, lightheadedness, shaking, sweating . Reports hyperglycemic symptoms, including polyuria, polydipsia, headache when sugar is "high" . Current exercise: walking as able  . Current blood glucose readings: FBG <150 . Cardiovascular risk reduction: o Current hypertensive regimen: carvedilol BID, HCTZ o Current hyperlipidemia regimen:  Pravastatin 15m daily  Patient Self Care Activities:  . Patient will check blood glucose daily or as directed by PCP , document, and provide at future appointments . Patient will focus on medication adherence by applying for medication assistance to obtain free insulin.  This will aid in compliance . Patient will take medications as prescribed . Patient will contact provider with any episodes of hypoglycemia . Patient will report any questions or concerns to provider   Objective:  Lab Results  Component Value Date   CREATININE 1.66 (H)  09/15/2018   CREATININE 1.53 (H) 10/14/2015   CREATININE 1.90 (H) 05/07/2014    Medications Reviewed Today    Reviewed by KSanjuana Kava MD (Physician) on 09/26/18 at [phone] Med List Status: <None>  Medication Order Taking? Sig Documenting Provider Last Dose Status Informant  ALPRAZolam (XANAX) 0.5 MG tablet [id number] Take 0.5 mg by mouth 3 (three) times daily as needed for anxiety. [provider] 09/15/2018 1700 Active Multiple Informants  aspirin EC 81 MG tablet [id number] Take 81 mg by mouth daily. [provider] 09/14/2018 Unknown time Active Multiple Informants  azithromycin (ZITHROMAX Z-PAK) 250 MG tablet [id number] Take 2 po the first day then once a day for the next 4 days. KRolland Porter MD  Active   carvedilol (COREG) 12.5 MG tablet [id number] Take 12.5 mg by mouth 2 (two) times daily with a meal.  [provider] 09/14/2018 0900 Active Multiple Informants  HUMALOG KWIKPEN 100 UNIT/ML KwikPen [id number] Inject 25 Units into the skin 2 (two) times a day.  [provider] 09/15/2018 0800 Active Multiple Informants  hydrochlorothiazide (HYDRODIURIL) 25 MG tablet [id number] Take 25 mg by mouth daily. [provider] 09/14/2018 Unknown time Active Multiple Informants  HYDROcodone-acetaminophen (NORCO/VICODIN) 5-325 MG tablet [id number] One tablet every four hours for pain. KSanjuana Kava MD  Active   insulin glargine (LANTUS) 100 UNIT/ML injection [id number] Inject 55 Units into the skin 2 (two) times daily.  [provider] 09/15/2018 0800 Active Multiple Informants  nitroGLYCERIN (NITROSTAT) 0.4 MG SL tablet [id number] ONE TABLET UNDER TONGUE AS NEEDED FOR CHEST PAIN EVERY 5 MINUTES CONTACT MD AS DIRECTED MSatira Sark MD Taking Active Multiple Informants  omeprazole (PRILOSEC) 20 MG  capsule [id number] No Take 20 mg by mouth daily. [provider] 09/14/2018 Unknown time Active Multiple Informants  pravastatin (PRAVACHOL) 80 MG  tablet [id number] No Take 80 mg by mouth daily. [provider] 09/14/2018 Unknown time Active Multiple Informants  predniSONE (DELTASONE) 20 MG tablet [id number]  Take 1 tablet (20 mg total) by mouth daily. Take 2 po QD x 2d then 1 po QD x 3d Rolland Porter, MD  Active           Medication Assistance Findings:   Patient Assistance Programs: Basaglar and Humalog PENS made by [place] requirement met: Yes o Out-of-pocket prescription expenditure met:   Not Applicable - Patient has met application requirements to apply for this program.     Plan: . I will route patient assistance letter to [place] technician who will coordinate patient assistance program application process for medications listed above.  [facility] pharmacy technician will assist with obtaining all required documents from both patient and provider(s) and submit application(s) once completed.   . I will follow up with patient in the next 4-6 weeks.   Regina Eck, PharmD, BCPS Clinical Pharmacist, [facility] [place]: [phone]

## 2018-10-11 ENCOUNTER — Other Ambulatory Visit: Payer: Self-pay | Admitting: Pharmacy Technician

## 2018-10-11 NOTE — Patient Outreach (Signed)
[place] [facility]) Care Management  10/11/2018  Marie House [birth year] [id number]   Received patient portion(s) of patient assistance application for Basaglar and Humalog. Faxed completed application and required documents into [employer].  Will follow up with company in 3-5 business days to check status of application.  Maud Deed Chana Bode [place] Certified Pharmacy Technician [place] Management Direct Dial:[phone]

## 2018-10-13 ENCOUNTER — Other Ambulatory Visit: Payer: Self-pay | Admitting: Pharmacy Technician

## 2018-10-13 NOTE — Patient Outreach (Signed)
[place] [facility]) Care Management  10/13/2018  FARIDA MCREYNOLDS Jul 27, 1934 [id number]     Follow up call placed to [facility] regarding patient assistance application(s) for Basaglar and Humalog , Tillie Rung confirms patient has been approved as of 7/30 until 03/15/19. Faxed script portion of application into [facility].  Follow up:  Will follow up with [facility] to check status of medication shipment  [employer]. Chana Bode [place] Certified Pharmacy Technician [place] Management Direct Dial:[phone]

## 2018-10-17 ENCOUNTER — Other Ambulatory Visit: Payer: Self-pay | Admitting: Pharmacy Technician

## 2018-10-17 NOTE — Patient Outreach (Signed)
[place] [facility]) Care Management  10/17/2018  DANYETTA GILLHAM 1934-06-20 [id number]    Follow up call placed to [facility] [place]) pharmacy regarding patient assistance update for Humalog and Basaglar shipping, Naaman Plummer set up medication to be shipped and to arrive at patients home on 8/6.    Successful call placed to patient regarding patient assistance shipping details for Humalog and Basaglar, HIPAA identifiers verified. Informed patient of above details  Follow up:  Will follow up with patient in 5-7 business days to confirm medication has been delivered  [employer]. Chana Bode [place] Certified Pharmacy Technician [place] Management Direct Dial:[phone]

## 2018-10-31 ENCOUNTER — Other Ambulatory Visit: Payer: Self-pay

## 2018-10-31 ENCOUNTER — Encounter: Payer: Self-pay | Admitting: Cardiology

## 2018-10-31 ENCOUNTER — Ambulatory Visit (INDEPENDENT_AMBULATORY_CARE_PROVIDER_SITE_OTHER): Payer: Medicare Other | Admitting: Cardiology

## 2018-10-31 VITALS — BP 188/76 | HR 91 | Temp 97.8°F | Ht 65.0 in | Wt 219.0 lb

## 2018-10-31 DIAGNOSIS — I25119 Atherosclerotic heart disease of native coronary artery with unspecified angina pectoris: Secondary | ICD-10-CM | POA: Diagnosis not present

## 2018-10-31 DIAGNOSIS — E782 Mixed hyperlipidemia: Secondary | ICD-10-CM | POA: Diagnosis not present

## 2018-10-31 DIAGNOSIS — N183 Chronic kidney disease, stage 3 unspecified: Secondary | ICD-10-CM

## 2018-10-31 DIAGNOSIS — I10 Essential (primary) hypertension: Secondary | ICD-10-CM | POA: Diagnosis not present

## 2018-10-31 MED ORDER — AMLODIPINE BESYLATE 5 MG PO TABS: 5.0000 mg | ORAL_TABLET | Freq: Every day | ORAL | 3 refills | Status: DC

## 2018-10-31 NOTE — Patient Instructions (Addendum)
Medication Instructions:  INCREASE Norvasc to 5 mg daily Labwork:  None today  Procedures/Testing: Your physician has requested that you have a lexiscan myoview. For further information please visit [email or web address]. Please follow instruction sheet, as given.     Follow-Up: To be determined after lexiscan  Any Additional Special Instructions Will Be Listed Below (If Applicable).     If you need a refill on your cardiac medications before your next appointment, please call your pharmacy.     Thank you for choosing [facility] !

## 2018-10-31 NOTE — Progress Notes (Signed)
Cardiology Office Note  Date: 10/31/2018   ID: LEIAH House, DOB 1934-04-06, MRN [id number]  PCP:  Asencion Noble, MD  Cardiologist:  Rozann Lesches, MD Electrophysiologist:  None   Chief Complaint  Patient presents with  . Cardiac follow-up    History of Present Illness: Marie House is an 84 y.o. female last seen in November 2019.  She presents for a follow-up visit.  I spoke with Dr. Willey Blade about Marie House back in March at which point we discussed arranging an echocardiogram as well as a Lexicographer.  Echocardiography revealed normal LVEF greater than 65%.  She did not present for stress testing.  She tells me that she has not had any major change in symptoms, predominantly dyspnea on exertion, no definite chest tightness.  She did tolerate the addition of Norvasc at 2.5 mg daily.  We discussed increasing this to 5 mg daily.  I also talked with her about pursuing the [facility], and she is in agreement.  Past Medical History:  Diagnosis Date  . Arthritis   . Carotid artery disease ([place])   . CKD (chronic kidney disease) stage 3, GFR 30-59 ml/min (HCC)   . Coronary atherosclerosis of native coronary artery    Cypher DES LAD 2003, Taxus DES LAD 2004  . Essential hypertension, benign   . Mixed hyperlipidemia   . Type 2 diabetes mellitus ([place])     Past Surgical History:  Procedure Laterality Date  . CAROTID ENDARTERECTOMY Right 2004  . CATARACT EXTRACTION    . CHOLECYSTECTOMY    . Left breast mastectomy     25 yrs ago  . MASTECTOMY Left   . PARTIAL HYSTERECTOMY    . TONSILLECTOMY      Current Outpatient Medications  Medication Sig Dispense Refill  . ALPRAZolam (XANAX) 0.5 MG tablet Take 0.5 mg by mouth 3 (three) times daily as needed for anxiety.    Marland Kitchen aspirin EC 81 MG tablet Take 81 mg by mouth daily.    . carvedilol (COREG) 12.5 MG tablet Take 12.5 mg by mouth 2 (two) times daily with a meal.     . HUMALOG KWIKPEN 100 UNIT/ML KwikPen Inject 25 Units into  the skin 2 (two) times a day.     Marland Kitchen HYDROcodone-acetaminophen (NORCO/VICODIN) 5-325 MG tablet One tablet every four hours for pain. 30 tablet 0  . insulin glargine (LANTUS) 100 UNIT/ML injection Inject 55 Units into the skin 2 (two) times daily.     . nitroGLYCERIN (NITROSTAT) 0.4 MG SL tablet ONE TABLET UNDER TONGUE AS NEEDED FOR CHEST PAIN EVERY 5 MINUTES CONTACT MD AS DIRECTED 150 tablet 3  . omeprazole (PRILOSEC) 20 MG capsule Take 20 mg by mouth daily.    . pravastatin (PRAVACHOL) 80 MG tablet Take 80 mg by mouth daily.    Marland Kitchen PROAIR HFA 108 (90 Base) MCG/ACT inhaler INL 2 PFS PO Q 4 H PRN     No current facility-administered medications for this visit.    Allergies:  Altace [ramipril], Atacand [candesartan], Avandia [rosiglitazone], Crestor [rosuvastatin], Diovan [valsartan], Lisinopril, Lotensin [benazepril], Norvasc [amlodipine besylate], and Zetia [ezetimibe]   Social History: The patient  reports that she quit smoking about 12 years ago. Her smoking use included cigarettes. She has never used smokeless tobacco. She reports that she does not drink alcohol or use drugs.   ROS:  Please see the history of present illness. Otherwise, complete review of systems is positive for hearing loss.  All other systems are  reviewed and negative.   Physical Exam: VS:  BP (!) 188/76   Pulse 91   Temp 97.8 F (36.6 C)   Ht 5\' 5"  (1.651 m)   Wt 219 lb (99.3 kg)   BMI 36.44 kg/m , BMI Body mass index is 36.44 kg/m.  Wt Readings from Last 3 Encounters:  10/31/18 219 lb (99.3 kg)  09/15/18 200 lb (90.7 kg)  04/06/18 210 lb (95.3 kg)    General: Elderly woman, appears comfortable at rest. HEENT: Conjunctiva and lids normal, wearing a mask. Neck: Supple, no elevated JVP or carotid bruits, no thyromegaly. Lungs: Clear to auscultation, nonlabored breathing at rest. Cardiac: Regular rate and rhythm, no S3 or significant systolic murmur, no pericardial rub. Abdomen: Soft, nontender, bowel sounds  present. Extremities: No pitting edema, distal pulses 2+. Skin: Warm and dry. Musculoskeletal: No kyphosis. Neuropsychiatric: Alert and oriented x3, affect grossly appropriate.  Hearing loss evident.  ECG:  An ECG dated 09/15/2018 was personally reviewed today and demonstrated:  Sinus rhythm.  Recent Labwork: 09/15/2018: ALT 19; AST 23; BUN 22; Creatinine, Ser 1.66; Hemoglobin 10.2; Platelets 285; Potassium 4.4; Sodium 137   Other Studies Reviewed Today:  Echocardiogram 07/27/2018:  1. The left ventricle has hyperdynamic systolic function, with an ejection fraction of >65%. The cavity size was normal. There is mild concentric left ventricular hypertrophy. Left ventricular diastolic Doppler parameters are consistent with impaired  relaxation. Indeterminate filling pressures.  2. Left ventricular cavity obliteration noted during systole causing a mild intracavitary gradient.  3. The right ventricle has normal systolic function. The cavity was normal. There is no increase in right ventricular wall thickness.  4. The mitral valve is grossly normal. There is moderate mitral annular calcification present.  5. The tricuspid valve is grossly normal.  6. The aortic valve is grossly normal. Mild thickening of the aortic valve.  7. The aortic root is normal in size and structure.  8. The interatrial septum was not well visualized.  Assessment and Plan:  1.  Dyspnea on exertion in the setting of known CAD and prior DES interventions to the LAD in 2003 in 2004.  We will increase Norvasc to 5 mg daily, currently tolerating.  Follow-up with Lexiscan Myoview to assess ischemic burden and guide further management.  2.  Mixed hyperlipidemia on Pravachol.  She continues to follow with Dr. Willey Blade.  3.  CKD stage III, last creatinine was 1.66.  4.  Essential hypertension, blood pressure elevated today.  Norvasc is being further advanced.  Medication Adjustments/Labs and Tests Ordered: Current medicines are  reviewed at length with the patient today.  Concerns regarding medicines are outlined above.   Tests Ordered: Orders Placed This Encounter  Procedures  . NM Myocar Multi W/Spect W/Wall Motion / EF    Medication Changes: No orders of the defined types were placed in this encounter.   Disposition:  Follow up test results and determine next step.  Signed, Satira Sark, MD, [facility] 10/31/2018 3:43 PM    [facility] [address] [place], [place] [address] Phone: [phone]; Fax: [phone]

## 2018-11-06 ENCOUNTER — Other Ambulatory Visit: Payer: Self-pay

## 2018-11-06 ENCOUNTER — Encounter (HOSPITAL_BASED_OUTPATIENT_CLINIC_OR_DEPARTMENT_OTHER)
Admission: RE | Admit: 2018-11-06 | Discharge: 2018-11-06 | Disposition: A | Discharge status: Home / Self Care | Payer: Medicare Other | Source: Ambulatory Visit | Attending: Cardiology | Admitting: Cardiology

## 2018-11-06 ENCOUNTER — Encounter (HOSPITAL_COMMUNITY)
Admission: RE | Admit: 2018-11-06 | Discharge: 2018-11-06 | Disposition: A | Discharge status: Home / Self Care | Payer: Medicare Other | Source: Ambulatory Visit | Attending: Cardiology | Admitting: Cardiology

## 2018-11-06 DIAGNOSIS — I25119 Atherosclerotic heart disease of native coronary artery with unspecified angina pectoris: Secondary | ICD-10-CM | POA: Diagnosis not present

## 2018-11-06 LAB — NM MYOCAR MULTI W/SPECT W/WALL MOTION / EF
LV dias vol: 74 mL (ref 46–106)
LV sys vol: 21 mL
Peak HR: 81 {beats}/min
RATE: 0.37
Rest HR: 72 {beats}/min
SDS: 1
SRS: 2
SSS: 3
TID: 1.2

## 2018-11-06 MED ORDER — TECHNETIUM TC 99M TETROFOSMIN IV KIT
10.0000 | PACK | Freq: Once | INTRAVENOUS | Status: AC | PRN
  Administered 2018-11-06: 09:00:00 11 via INTRAVENOUS

## 2018-11-06 MED ORDER — SODIUM CHLORIDE 0.9% FLUSH
INTRAVENOUS | Status: AC
  Administered 2018-11-06: 11:00:00 10 mL via INTRAVENOUS
  Filled 2018-11-06: qty 10

## 2018-11-06 MED ORDER — TECHNETIUM TC 99M TETROFOSMIN IV KIT
30.0000 | PACK | Freq: Once | INTRAVENOUS | Status: AC | PRN
  Administered 2018-11-06: 33 via INTRAVENOUS

## 2018-11-06 MED ORDER — REGADENOSON 0.4 MG/5ML IV SOLN
INTRAVENOUS | Status: AC
  Administered 2018-11-06: 0.4 mg via INTRAVENOUS
  Filled 2018-11-06: qty 5

## 2018-11-13 ENCOUNTER — Other Ambulatory Visit: Payer: Self-pay | Admitting: Pharmacist

## 2018-11-13 ENCOUNTER — Other Ambulatory Visit: Payer: Self-pay | Admitting: Pharmacy Technician

## 2018-11-13 NOTE — Patient Outreach (Signed)
[place] [facility]) Care Management [place]  11/13/2018  Marie House January 08, 1935 [id number]  Reason for referral: medication assistance  [facility] pharmacy case is being closed due to the following reasons:  -Goals of care have been met. Medication list updated in EMR.  Reviewed new insulin with patient.  Patient verbalizes understanding.  No further pharmacy needs identified at this time.  Regina Eck, PharmD, [place]  [phone]

## 2018-11-13 NOTE — Patient Outreach (Signed)
[place] [facility]) Care Management  11/13/2018  Marie House Apr 24, 1934 [id number]    Successful call placed to patient regarding patient assistance medication delivery of Humalog and Basaglar, HIPAA identifiers verified. [name] confirms that she received both medications from [employer]. Reviewed with her on obtaining refills and requested she contact me with any issues or additional questions.   Follow up:  Will route note to [place] for case closure  Maud Deed. Chana Bode [place] Certified Pharmacy Technician [place] Management Direct Dial:[phone]

## 2018-11-23 ENCOUNTER — Other Ambulatory Visit: Payer: Self-pay

## 2018-11-23 ENCOUNTER — Encounter: Payer: Self-pay | Admitting: Orthopaedic Surgery

## 2018-11-23 ENCOUNTER — Ambulatory Visit (INDEPENDENT_AMBULATORY_CARE_PROVIDER_SITE_OTHER): Payer: Medicare Other | Admitting: Orthopaedic Surgery

## 2018-11-23 VITALS — BP 160/61 | HR 93 | Wt 222.0 lb

## 2018-11-23 DIAGNOSIS — M25561 Pain in right knee: Secondary | ICD-10-CM

## 2018-11-23 DIAGNOSIS — M25562 Pain in left knee: Secondary | ICD-10-CM

## 2018-11-23 DIAGNOSIS — G8929 Other chronic pain: Secondary | ICD-10-CM

## 2018-11-23 NOTE — Progress Notes (Signed)
CC: Both of my knees are hurting. I would like an injection in both knees.  The patient has had chronic pain and tenderness of both knees for some time.  Injections help.  There is no locking or giving way of the knee.  There is no new trauma. There is no redness or signs of infections.  The knees have a mild effusion and some crepitus.  There is no redness or signs of recent trauma.  Right knee ROM is 0-105 and left knee ROM is 0-100.  Impression:  Chronic pain of the both knees  Return:  2 months  PROCEDURE NOTE:  The patient requests injections of both knees, verbal consent was obtained.  The left and right knee were individually prepped appropriately after time out was performed.   Sterile technique was observed and injection of 1 cc of Depo-Medrol 40 mg with several cc's of plain xylocaine. Anesthesia was provided by ethyl chloride and a 20-gauge needle was used to inject each knee area. The injections were tolerated well.  A band aid dressing was applied.  The patient was advised to apply ice later today and tomorrow to the injection sight as needed.  Electronically Signed Sanjuana Kava, MD 9/10/20209:30 AM

## 2018-12-28 DIAGNOSIS — X32XXXD Exposure to sunlight, subsequent encounter: Secondary | ICD-10-CM | POA: Diagnosis not present

## 2018-12-28 DIAGNOSIS — L570 Actinic keratosis: Secondary | ICD-10-CM | POA: Diagnosis not present

## 2019-01-01 DIAGNOSIS — L110 Acquired keratosis follicularis: Secondary | ICD-10-CM | POA: Diagnosis not present

## 2019-01-01 DIAGNOSIS — I739 Peripheral vascular disease, unspecified: Secondary | ICD-10-CM | POA: Diagnosis not present

## 2019-01-01 DIAGNOSIS — M79672 Pain in left foot: Secondary | ICD-10-CM | POA: Diagnosis not present

## 2019-01-01 DIAGNOSIS — I2510 Atherosclerotic heart disease of native coronary artery without angina pectoris: Secondary | ICD-10-CM | POA: Diagnosis not present

## 2019-01-01 DIAGNOSIS — M79671 Pain in right foot: Secondary | ICD-10-CM | POA: Diagnosis not present

## 2019-01-01 DIAGNOSIS — E1129 Type 2 diabetes mellitus with other diabetic kidney complication: Secondary | ICD-10-CM | POA: Diagnosis not present

## 2019-01-01 DIAGNOSIS — E1140 Type 2 diabetes mellitus with diabetic neuropathy, unspecified: Secondary | ICD-10-CM | POA: Diagnosis not present

## 2019-01-01 DIAGNOSIS — R609 Edema, unspecified: Secondary | ICD-10-CM | POA: Diagnosis not present

## 2019-01-08 DIAGNOSIS — I25119 Atherosclerotic heart disease of native coronary artery with unspecified angina pectoris: Secondary | ICD-10-CM | POA: Diagnosis not present

## 2019-01-08 DIAGNOSIS — N184 Chronic kidney disease, stage 4 (severe): Secondary | ICD-10-CM | POA: Diagnosis not present

## 2019-01-08 DIAGNOSIS — Z79899 Other long term (current) drug therapy: Secondary | ICD-10-CM | POA: Diagnosis not present

## 2019-01-08 DIAGNOSIS — E1129 Type 2 diabetes mellitus with other diabetic kidney complication: Secondary | ICD-10-CM | POA: Diagnosis not present

## 2019-01-09 ENCOUNTER — Telehealth: Payer: Self-pay | Admitting: *Deleted

## 2019-01-09 NOTE — Telephone Encounter (Signed)
Please cut the Norvasc back to 2.5 mg daily which she tolerated previously.  Let's try Imdur beginning at 15 mg in the evening.

## 2019-01-09 NOTE — Telephone Encounter (Signed)
Pt states that she has been having SOB and leg swelling for 2 weeks. She states sx started after starting Amlodipine 5 mg 10/31/18. Pt denies chest pain at this time. Current weight is 230 lb. Please advise.

## 2019-01-10 NOTE — Telephone Encounter (Signed)
Called to notify pt of medications changes. Pt states that she does not want to continue to take Norvasc. She declines to start taking Imdur at this time. Encouraged pt to take Norvasc 2.5 mg daily and Imdur 15 mg Daily in the evening. Pt again declined.

## 2019-01-12 DIAGNOSIS — Z23 Encounter for immunization: Secondary | ICD-10-CM | POA: Diagnosis not present

## 2019-01-25 ENCOUNTER — Ambulatory Visit: Payer: Medicare Other | Admitting: Orthopaedic Surgery

## 2019-02-01 ENCOUNTER — Other Ambulatory Visit: Payer: Self-pay

## 2019-02-01 ENCOUNTER — Encounter: Payer: Self-pay | Admitting: Orthopaedic Surgery

## 2019-02-01 ENCOUNTER — Ambulatory Visit (INDEPENDENT_AMBULATORY_CARE_PROVIDER_SITE_OTHER): Payer: Medicare Other | Admitting: Orthopaedic Surgery

## 2019-02-01 DIAGNOSIS — H353211 Exudative age-related macular degeneration, right eye, with active choroidal neovascularization: Secondary | ICD-10-CM | POA: Diagnosis not present

## 2019-02-01 DIAGNOSIS — M25561 Pain in right knee: Secondary | ICD-10-CM

## 2019-02-01 DIAGNOSIS — E113491 Type 2 diabetes mellitus with severe nonproliferative diabetic retinopathy without macular edema, right eye: Secondary | ICD-10-CM | POA: Diagnosis not present

## 2019-02-01 DIAGNOSIS — M25562 Pain in left knee: Secondary | ICD-10-CM

## 2019-02-01 DIAGNOSIS — H353212 Exudative age-related macular degeneration, right eye, with inactive choroidal neovascularization: Secondary | ICD-10-CM | POA: Diagnosis not present

## 2019-02-01 DIAGNOSIS — E113492 Type 2 diabetes mellitus with severe nonproliferative diabetic retinopathy without macular edema, left eye: Secondary | ICD-10-CM | POA: Diagnosis not present

## 2019-02-01 DIAGNOSIS — G8929 Other chronic pain: Secondary | ICD-10-CM | POA: Diagnosis not present

## 2019-02-01 NOTE — Progress Notes (Signed)
CC: Both of my knees are hurting. I would like an injection in both knees.  The patient has had chronic pain and tenderness of both knees for some time.  Injections help.  There is no locking or giving way of the knee.  There is no new trauma. There is no redness or signs of infections.  The knees have a mild effusion and some crepitus.  There is no redness or signs of recent trauma.  Right knee ROM is 0-95 and left knee ROM is 0-100.  Impression:  Chronic pain of the both knees  Return:  2 months  PROCEDURE NOTE:  The patient requests injections of both knees, verbal consent was obtained.  The left and right knee were individually prepped appropriately after time out was performed.   Sterile technique was observed and injection of 1 cc of Depo-Medrol 40 mg with several cc's of plain xylocaine. Anesthesia was provided by ethyl chloride and a 20-gauge needle was used to inject each knee area. The injections were tolerated well.  A band aid dressing was applied.  The patient was advised to apply ice later today and tomorrow to the injection sight as needed.    Electronically Signed Sanjuana Kava, MD 11/19/20209:33 AM

## 2019-02-06 DIAGNOSIS — E1165 Type 2 diabetes mellitus with hyperglycemia: Secondary | ICD-10-CM | POA: Diagnosis not present

## 2019-04-02 DIAGNOSIS — N183 Chronic kidney disease, stage 3 unspecified: Secondary | ICD-10-CM | POA: Diagnosis not present

## 2019-04-02 DIAGNOSIS — R600 Localized edema: Secondary | ICD-10-CM | POA: Diagnosis not present

## 2019-04-02 DIAGNOSIS — I2510 Atherosclerotic heart disease of native coronary artery without angina pectoris: Secondary | ICD-10-CM | POA: Diagnosis not present

## 2019-04-02 DIAGNOSIS — E1129 Type 2 diabetes mellitus with other diabetic kidney complication: Secondary | ICD-10-CM | POA: Diagnosis not present

## 2019-04-02 DIAGNOSIS — Z79899 Other long term (current) drug therapy: Secondary | ICD-10-CM | POA: Diagnosis not present

## 2019-04-03 ENCOUNTER — Other Ambulatory Visit: Payer: Self-pay

## 2019-04-03 ENCOUNTER — Encounter: Payer: Self-pay | Admitting: Orthopaedic Surgery

## 2019-04-03 ENCOUNTER — Ambulatory Visit: Payer: Medicare Other | Admitting: Orthopaedic Surgery

## 2019-04-03 VITALS — Ht 65.0 in | Wt 222.0 lb

## 2019-04-03 DIAGNOSIS — E1122 Type 2 diabetes mellitus with diabetic chronic kidney disease: Secondary | ICD-10-CM | POA: Diagnosis not present

## 2019-04-03 DIAGNOSIS — G8929 Other chronic pain: Secondary | ICD-10-CM | POA: Diagnosis not present

## 2019-04-03 DIAGNOSIS — N184 Chronic kidney disease, stage 4 (severe): Secondary | ICD-10-CM | POA: Diagnosis not present

## 2019-04-03 DIAGNOSIS — M25562 Pain in left knee: Secondary | ICD-10-CM

## 2019-04-03 DIAGNOSIS — M25561 Pain in right knee: Secondary | ICD-10-CM

## 2019-04-03 DIAGNOSIS — D509 Iron deficiency anemia, unspecified: Secondary | ICD-10-CM | POA: Diagnosis not present

## 2019-04-03 DIAGNOSIS — E785 Hyperlipidemia, unspecified: Secondary | ICD-10-CM | POA: Diagnosis not present

## 2019-04-03 MED ORDER — HYDROCODONE-ACETAMINOPHEN 5-325 MG PO TABS: ORAL_TABLET | ORAL | 0 refills | Status: DC

## 2019-04-03 NOTE — Progress Notes (Signed)
CC: Both of my knees are hurting. I would like an injection in both knees.  The patient has had chronic pain and tenderness of both knees for some time.  Injections help.  There is no locking or giving way of the knee.  There is no new trauma. There is no redness or signs of infections.  The knees have a mild effusion and some crepitus.  There is no redness or signs of recent trauma.  Right knee ROM is 0-105 and left knee ROM is 0-100.  Impression:  Chronic pain of the both knees  Return:  2 months  PROCEDURE NOTE:  The patient requests injections of both knees, verbal consent was obtained.  The left and right knee were individually prepped appropriately after time out was performed.   Sterile technique was observed and injection of 1 cc of Depo-Medrol 40 mg with several cc's of plain xylocaine. Anesthesia was provided by ethyl chloride and a 20-gauge needle was used to inject each knee area. The injections were tolerated well.  A band aid dressing was applied.  The patient was advised to apply ice later today and tomorrow to the injection sight as needed.   I have reviewed the [place] web site prior to prescribing narcotic medicine for this patient.   Electronically Signed Sanjuana Kava, MD 1/19/202110:10 AM

## 2019-04-05 DIAGNOSIS — M79671 Pain in right foot: Secondary | ICD-10-CM | POA: Diagnosis not present

## 2019-04-05 DIAGNOSIS — L110 Acquired keratosis follicularis: Secondary | ICD-10-CM | POA: Diagnosis not present

## 2019-04-05 DIAGNOSIS — E1140 Type 2 diabetes mellitus with diabetic neuropathy, unspecified: Secondary | ICD-10-CM | POA: Diagnosis not present

## 2019-04-05 DIAGNOSIS — M79672 Pain in left foot: Secondary | ICD-10-CM | POA: Diagnosis not present

## 2019-04-05 DIAGNOSIS — I739 Peripheral vascular disease, unspecified: Secondary | ICD-10-CM | POA: Diagnosis not present

## 2019-04-10 DIAGNOSIS — D509 Iron deficiency anemia, unspecified: Secondary | ICD-10-CM | POA: Diagnosis not present

## 2019-04-16 ENCOUNTER — Encounter (INDEPENDENT_AMBULATORY_CARE_PROVIDER_SITE_OTHER): Payer: Self-pay | Admitting: Internal Medicine

## 2019-04-16 ENCOUNTER — Other Ambulatory Visit: Payer: Self-pay

## 2019-04-16 ENCOUNTER — Ambulatory Visit (INDEPENDENT_AMBULATORY_CARE_PROVIDER_SITE_OTHER): Payer: Medicare Other | Admitting: Internal Medicine

## 2019-04-16 DIAGNOSIS — K219 Gastro-esophageal reflux disease without esophagitis: Secondary | ICD-10-CM

## 2019-04-16 DIAGNOSIS — D509 Iron deficiency anemia, unspecified: Secondary | ICD-10-CM

## 2019-04-16 MED ORDER — FLINTSTONES GUMMIES PO CHEW: 1.0000 | CHEWABLE_TABLET | Freq: Every day | ORAL | Status: DC

## 2019-04-16 NOTE — Patient Instructions (Addendum)
Hemoccult x 3. CBC to be done on 04/27/2019.

## 2019-04-16 NOTE — Progress Notes (Signed)
Reason for consultation  Iron deficiency anemia  History of present illness  Patient is 85 year old Caucasian female who is referred through courtesy of Dr. Asencion Noble for evaluation of newly diagnosed iron deficiency anemia. Patient is accompanied by her Linward Foster who lives with her.  Patient was last seen in our office on 02/04/2016 for dark stools.  Her stool was guaiac negative.  She had 3 more Hemoccults and these were negative. Her H&H was 11.9 and 36.5.  Colonoscopy was recommended but she declined.  Patient was seen by Dr. Willey Blade on 04/03/2019.  She had routine lab studies 1 day prior to that visit.  Her H&H was 9.1 and 29.6 with MCV of 76.  She had follow-up CBC and iron studies on 04/10/2019.  Her hemoglobin was 9.5 with hematocrit of 31.3.  Iron studies confirmed iron deficiency anemia.  These are reviewed under lab data.  Patient reports heme-negative stool. Patient denies abdominal pain melena or rectal bleeding hematuria or vaginal bleeding.  She has very good appetite.  She denies weight loss.  Her sister states she has had symptoms of GERD for 30 years and she has been on omeprazole for several years with satisfactory control of her heartburn.  She denies dysphagia nausea or vomiting.  She is prone to constipation because she has to take pain medication for severe osteoarthrosis of both knees.  She uses stool softener and arguments laxative on as-needed basis no more than once a week.  These medications are not mentioned under list.  She does not take aspirin or other OTC NSAIDs. Review of the systems is positive for progressive fatigue and exertional dyspnea.  Patient sister states that she cannot even complete 1 mile at a grocery store because she become short of breath and has to stop.  No history of chest pain with her shortness of breath.  She has history of coronary artery disease.  She was seen by Dr. Johnny Bridge of cardiology in August 2020.  Lexiscan Myoview was negative for  ischemia.  Patient's amlodipine dose was increased to 5 mg daily but developed LE edema and felt it made her shortness of breath worse.  Amlodipine dose was decreased to 2.5 mg which she did not tolerate either.  Patient has stopped this medication. Patient sister states that she has had more difficulty breathing since July 2020 when she was seen in emergency room and felt to have bronchitis.  She says she has never been diagnosed with COPD.  Review of patient's records revealed that she had EGD and colonoscopy by Dr. Gala Romney in August 2003.  EGD revealed antral erosions.  Colonoscopy revealed left-sided diverticulosis and internal hemorrhoids.  She also had H. pylori serology but result is not in epic.   Current Medications: Outpatient Encounter Medications as of 04/16/2019  Medication Sig  . ALPRAZolam (XANAX) 0.5 MG tablet Take 0.5 mg by mouth 3 (three) times daily as needed for anxiety.  Marland Kitchen HUMALOG KWIKPEN 100 UNIT/ML KwikPen Inject 25 Units into the skin 2 (two) times a day.   Marland Kitchen HYDROcodone-acetaminophen (NORCO/VICODIN) 5-325 MG tablet One tablet every four hours as needed for acute pain.  Limit of five days per [place] statue.  . Insulin Glargine (BASAGLAR KWIKPEN) 100 UNIT/ML SOPN Inject 55 Units into the skin 2 (two) times daily.  . nitroGLYCERIN (NITROSTAT) 0.4 MG SL tablet ONE TABLET UNDER TONGUE AS NEEDED FOR CHEST PAIN EVERY 5 MINUTES CONTACT MD AS DIRECTED  . omeprazole (PRILOSEC) 20 MG capsule Take 20 mg  by mouth daily.  . pravastatin (PRAVACHOL) 80 MG tablet Take 80 mg by mouth daily.  Marland Kitchen PROAIR HFA 108 (90 Base) MCG/ACT inhaler INL 2 PFS PO Q 4 H PRN  . traZODone (DESYREL) 50 MG tablet Take 50 mg by mouth at bedtime.  Marland Kitchen aspirin EC 81 MG tablet Take 81 mg by mouth daily.  . carvedilol (COREG) 12.5 MG tablet Take 12.5 mg by mouth 2 (two) times daily with a meal.   . [DISCONTINUED] amLODipine (NORVASC) 5 MG tablet Take 1 tablet (5 mg total) by mouth daily.   No facility-administered  encounter medications on file as of 04/16/2019.   Past medical history Past Medical History:  Diagnosis Date  .  Osteoarthrosis of both knees.   . Carotid artery disease ([place])   . CKD (chronic kidney disease) stage 3, GFR 30-59 ml/min   . Coronary atherosclerosis of native coronary artery    Cypher DES LAD 2003, Taxus DES LAD 2004  . Essential hypertension, benign   . Mixed hyperlipidemia   . Type 2 diabetes mellitus ([place]) possibly 30 years duration         EGD and colonoscopy in August 2003 with findings as above.      ? Macular degeneration.  Patient receives iron injections to both eyes by Dr.       Zadie Rhine.       Chronic anxiety.       Chronic insomnia   Past Surgical History:  Procedure Laterality Date  . CAROTID ENDARTERECTOMY Right 2004  . CATARACT EXTRACTION    . CHOLECYSTECTOMY    . Left breast mastectomy     25 yrs ago  . MASTECTOMY Left   . PARTIAL HYSTERECTOMY    . TONSILLECTOMY     Allergies  Allergies  Allergen Reactions  . Altace [Ramipril]   . Atacand [Candesartan]   . Avandia [Rosiglitazone]   . Crestor [Rosuvastatin]   . Diovan [Valsartan]   . Lisinopril   . Lotensin [Benazepril]   . Norvasc [Amlodipine Besylate]   . Zetia [Ezetimibe]    Family history  Mother was diabetic and died of MI at age 67. Father died of brain tumor at age 37. She has 1 sister living who is in good health. She has 3 brothers and they are all disease.  One died of MI at age 54 one died of head and neck cancer at age 26 and third brother died following surgery for gastric outlet obstruction and he was diagnosed with metastatic neuroendocrine tumor.  He was 83 years old.  Social history  She is widowed.  She had 3 children and only one is living.  Her son is 38 years old and in good health. One son died of head and neck cancer at age 64 and her daughter was murdered while working in [place].  She was 85 years old. 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  She used to smoke cigarettes.  She smoked for 10 years up to 2 packs/day but quit about 40 years ago.  She does not drink alcohol.   Physical examination  Blood pressure (!) 178/75, pulse 86, temperature (!) 97.4 F (36.3 C), temperature source Temporal, height 5\' 5"  (1.651 m), weight 213 lb 6.4 oz (96.8 kg). Patient is alert and in no acute distress. She is wearing a facial mask. Conjunctiva is pink. Sclera is nonicteric Oropharyngeal mucosa is normal. She has upper and lower dentures in place. No  neck masses or thyromegaly noted. Cardiac exam with regular rhythm normal S1 and S2. No murmur or gallop noted. Lungs are clear to auscultation. Abdomen is full.  She has lower midline scar along with supraumbilical and subcostal laparoscopy scars.  On palpation abdomen is soft and nontender with organomegaly or masses. No LE edema or clubbing noted.  Labs/studies Results: Lab data from 04/02/2019 WBC 9.1 H&H 9.1 and 29.6 MCV 76 Platelet count 290 5K.  Glucose 153 BUN 22 and creatinine 1.99 Serum sodium 137, potassium 4.4, chloride 102 CO2 20 Serum calcium 9.5 Bilirubin less than 0.3 AP 114 AST 17 ALT 14 total protein 6.8 and albumin 4.2.  Hemoglobin A1c 8.1.  Lab data from 04/10/2019  H&H 9.5 and 31.3  Serum iron 34, TIBC 464, iron saturation 7% Serum ferritin 9 Serum B12 276 Serum folate 9.5.   Assessment:  #1.  Iron deficiency anemia.  Patient is 85 year old Caucasian female recent diagnosis of iron deficiency anemia.  No history of melena or rectal bleeding.  Stool was guaiac negative.  She has chronic GERD and maintained on low-dose PPI with satisfactory symptom control.  No prior history of peptic ulcer disease.  Last EGD and colonoscopy were not August 2003. Given her presentation needs to rule out occult neoplasm in upper or lower GI tract.  She could also have GI angiodysplasia or impaired iron absorption due to chronic acid suppression. Patient declines to  undergo colonoscopy at this time.  She feels she would not be able to tolerate prep.  #2.  Chronic GERD.  Last EGD was in August 2003 and negative for Barrett's.  Symptoms are well controlled with low-dose PPI.  Recommendations  Hemoccult x 3. [place] chewable with iron 1 tablet daily. She can continue Centrum Silver 1 tablet daily as before. CBC on 04/27/2019. We will have another opportunity to talk with patient when results of Hemoccult and CBC known.  Hopefully she will change her mind and we can proceed with work-up.

## 2019-04-19 ENCOUNTER — Telehealth (INDEPENDENT_AMBULATORY_CARE_PROVIDER_SITE_OTHER): Payer: Self-pay | Admitting: *Deleted

## 2019-04-19 ENCOUNTER — Ambulatory Visit (INDEPENDENT_AMBULATORY_CARE_PROVIDER_SITE_OTHER): Payer: Self-pay

## 2019-04-19 ENCOUNTER — Other Ambulatory Visit: Payer: Self-pay

## 2019-04-19 NOTE — Telephone Encounter (Signed)
   Diagnosis:    Result(s)   Card 1: Negative:     Card 2: Negative:   Card 3:Negative:    Completed by: Thomas Hoff , LPN   HEMOCCULT SENSA DEVELOPER: LOT#: [id number]  EXPIRATION DATE: 2022-06   HEMOCCULT SENSA CARD:  LOT#:  [id number] EXPIRATION DATE: 1856   CARD CONTROL RESULTS:  POSITIVE:Positive  NEGATIVE: Negative    ADDITIONAL COMMENTS: Patient called with results. Dr, Laural Golden made aware.

## 2019-04-26 DIAGNOSIS — L570 Actinic keratosis: Secondary | ICD-10-CM | POA: Diagnosis not present

## 2019-04-26 DIAGNOSIS — X32XXXD Exposure to sunlight, subsequent encounter: Secondary | ICD-10-CM | POA: Diagnosis not present

## 2019-04-26 DIAGNOSIS — C44622 Squamous cell carcinoma of skin of right upper limb, including shoulder: Secondary | ICD-10-CM | POA: Diagnosis not present

## 2019-04-26 DIAGNOSIS — C44321 Squamous cell carcinoma of skin of nose: Secondary | ICD-10-CM | POA: Diagnosis not present

## 2019-04-26 DIAGNOSIS — C44329 Squamous cell carcinoma of skin of other parts of face: Secondary | ICD-10-CM | POA: Diagnosis not present

## 2019-04-27 ENCOUNTER — Other Ambulatory Visit (INDEPENDENT_AMBULATORY_CARE_PROVIDER_SITE_OTHER): Payer: Self-pay | Admitting: Internal Medicine

## 2019-04-27 DIAGNOSIS — D509 Iron deficiency anemia, unspecified: Secondary | ICD-10-CM | POA: Diagnosis not present

## 2019-04-28 LAB — CBC WITH DIFFERENTIAL/PLATELET
Basophils Absolute: 0 10*3/uL (ref 0.0–0.2)
Basos: 0 %
EOS (ABSOLUTE): 0.2 10*3/uL (ref 0.0–0.4)
Eos: 2 %
Hematocrit: 30.4 % — ABNORMAL LOW (ref 34.0–46.6)
Hemoglobin: 9.3 g/dL — ABNORMAL LOW (ref 11.1–15.9)
Immature Grans (Abs): 0 10*3/uL (ref 0.0–0.1)
Immature Granulocytes: 0 %
Lymphocytes Absolute: 2.6 10*3/uL (ref 0.7–3.1)
Lymphs: 29 %
MCH: 23.4 pg — ABNORMAL LOW (ref 26.6–33.0)
MCHC: 30.6 g/dL — ABNORMAL LOW (ref 31.5–35.7)
MCV: 77 fL — ABNORMAL LOW (ref 79–97)
Monocytes Absolute: 0.8 10*3/uL (ref 0.1–0.9)
Monocytes: 9 %
Neutrophils Absolute: 5.3 10*3/uL (ref 1.4–7.0)
Neutrophils: 60 %
Platelets: 273 10*3/uL (ref 150–450)
RBC: 3.97 x10E6/uL (ref 3.77–5.28)
RDW: 16 % — ABNORMAL HIGH (ref 11.7–15.4)
WBC: 9 10*3/uL (ref 3.4–10.8)

## 2019-05-02 ENCOUNTER — Other Ambulatory Visit (INDEPENDENT_AMBULATORY_CARE_PROVIDER_SITE_OTHER): Payer: Self-pay | Admitting: *Deleted

## 2019-05-02 DIAGNOSIS — D509 Iron deficiency anemia, unspecified: Secondary | ICD-10-CM

## 2019-05-31 ENCOUNTER — Encounter: Payer: Self-pay | Admitting: Orthopaedic Surgery

## 2019-05-31 ENCOUNTER — Ambulatory Visit: Payer: Medicare Other | Admitting: Orthopaedic Surgery

## 2019-05-31 ENCOUNTER — Other Ambulatory Visit: Payer: Self-pay

## 2019-05-31 VITALS — Ht 65.0 in | Wt 213.0 lb

## 2019-05-31 DIAGNOSIS — M25561 Pain in right knee: Secondary | ICD-10-CM | POA: Diagnosis not present

## 2019-05-31 DIAGNOSIS — M25562 Pain in left knee: Secondary | ICD-10-CM | POA: Diagnosis not present

## 2019-05-31 DIAGNOSIS — G8929 Other chronic pain: Secondary | ICD-10-CM

## 2019-05-31 NOTE — Progress Notes (Signed)
PROCEDURE NOTE:  The patient requests injections of the left knee , verbal consent was obtained.  The left knee was prepped appropriately after time out was performed.   Sterile technique was observed and injection of 1 cc of Depo-Medrol 40 mg with several cc's of plain xylocaine. Anesthesia was provided by ethyl chloride and a 20-gauge needle was used to inject the knee area. The injection was tolerated well.  A band aid dressing was applied.  The patient was advised to apply ice later today and tomorrow to the injection sight as needed.  PROCEDURE NOTE:  The patient requests injections of the right knee , verbal consent was obtained.  The right knee was prepped appropriately after time out was performed.   Sterile technique was observed and injection of 1 cc of Depo-Medrol 40 mg with several cc's of plain xylocaine. Anesthesia was provided by ethyl chloride and a 20-gauge needle was used to inject the knee area. The injection was tolerated well.  A band aid dressing was applied.  The patient was advised to apply ice later today and tomorrow to the injection sight as needed.  Return in two months.  Call if any problem.  Precautions discussed.   Electronically Signed Sanjuana Kava, MD 3/18/20219:53 AM

## 2019-06-28 DIAGNOSIS — E1140 Type 2 diabetes mellitus with diabetic neuropathy, unspecified: Secondary | ICD-10-CM | POA: Diagnosis not present

## 2019-06-28 DIAGNOSIS — M79671 Pain in right foot: Secondary | ICD-10-CM | POA: Diagnosis not present

## 2019-06-28 DIAGNOSIS — I739 Peripheral vascular disease, unspecified: Secondary | ICD-10-CM | POA: Diagnosis not present

## 2019-06-28 DIAGNOSIS — L110 Acquired keratosis follicularis: Secondary | ICD-10-CM | POA: Diagnosis not present

## 2019-06-28 DIAGNOSIS — M79672 Pain in left foot: Secondary | ICD-10-CM | POA: Diagnosis not present

## 2019-07-31 ENCOUNTER — Encounter: Payer: Self-pay | Admitting: Orthopaedic Surgery

## 2019-07-31 ENCOUNTER — Other Ambulatory Visit: Payer: Self-pay

## 2019-07-31 ENCOUNTER — Ambulatory Visit: Payer: Medicare Other | Admitting: Orthopaedic Surgery

## 2019-07-31 VITALS — Ht 65.0 in | Wt 213.0 lb

## 2019-07-31 DIAGNOSIS — M25561 Pain in right knee: Secondary | ICD-10-CM | POA: Diagnosis not present

## 2019-07-31 DIAGNOSIS — G8929 Other chronic pain: Secondary | ICD-10-CM

## 2019-07-31 DIAGNOSIS — M25562 Pain in left knee: Secondary | ICD-10-CM

## 2019-07-31 NOTE — Progress Notes (Signed)
PROCEDURE NOTE:  The patient requests injections of the left knee , verbal consent was obtained.  The left knee was prepped appropriately after time out was performed.   Sterile technique was observed and injection of 1 cc of Depo-Medrol 40 mg with several cc's of plain xylocaine. Anesthesia was provided by ethyl chloride and a 20-gauge needle was used to inject the knee area. The injection was tolerated well.  A band aid dressing was applied.  The patient was advised to apply ice later today and tomorrow to the injection sight as needed.  PROCEDURE NOTE:  The patient requests injections of the right knee , verbal consent was obtained.  The right knee was prepped appropriately after time out was performed.   Sterile technique was observed and injection of 1 cc of Depo-Medrol 40 mg with several cc's of plain xylocaine. Anesthesia was provided by ethyl chloride and a 20-gauge needle was used to inject the knee area. The injection was tolerated well.  A band aid dressing was applied.  The patient was advised to apply ice later today and tomorrow to the injection sight as needed.  Return in two months.  Electronically Signed Sanjuana Kava, MD 5/18/20212:19 PM

## 2019-08-30 ENCOUNTER — Other Ambulatory Visit: Payer: Self-pay

## 2019-08-30 ENCOUNTER — Ambulatory Visit (INDEPENDENT_AMBULATORY_CARE_PROVIDER_SITE_OTHER): Payer: Medicare Other | Admitting: Ophthalmology

## 2019-08-30 ENCOUNTER — Encounter (INDEPENDENT_AMBULATORY_CARE_PROVIDER_SITE_OTHER): Payer: Self-pay | Admitting: Ophthalmology

## 2019-08-30 DIAGNOSIS — H353134 Nonexudative age-related macular degeneration, bilateral, advanced atrophic with subfoveal involvement: Secondary | ICD-10-CM | POA: Diagnosis not present

## 2019-08-30 DIAGNOSIS — H348312 Tributary (branch) retinal vein occlusion, right eye, stable: Secondary | ICD-10-CM | POA: Insufficient documentation

## 2019-08-30 DIAGNOSIS — H5316 Psychophysical visual disturbances: Secondary | ICD-10-CM | POA: Diagnosis not present

## 2019-08-30 DIAGNOSIS — H353212 Exudative age-related macular degeneration, right eye, with inactive choroidal neovascularization: Secondary | ICD-10-CM | POA: Diagnosis not present

## 2019-08-30 NOTE — Assessment & Plan Note (Signed)
OD condition stable, no active lesions, no active vasculopathy

## 2019-08-30 NOTE — Progress Notes (Signed)
08/30/2019     CHIEF COMPLAINT Patient presents for Retina Follow Up   HISTORY OF PRESENT ILLNESS: Marie House is a 85 y.o. female who presents to the clinic today for:   HPI    Retina Follow Up    In both eyes.  Duration of 8 months.  Since onset it is stable.          Comments    8 month follow up - FP OU Patient denies change in vision and overall has no complaints.        Last edited by Gerda Diss on 08/30/2019  2:26 PM. (History)      Referring physician: Asencion Noble, MD [address] [place],  [place] [address]  HISTORICAL INFORMATION:   Selected notes from the MEDICAL RECORD NUMBER       CURRENT MEDICATIONS: No current outpatient medications on file. (Ophthalmic Drugs)   No current facility-administered medications for this visit. (Ophthalmic Drugs)   Current Outpatient Medications (Other)  Medication Sig  . ALPRAZolam (XANAX) 0.5 MG tablet Take 0.5 mg by mouth 3 (three) times daily as needed for anxiety.  Marland Kitchen aspirin EC 81 MG tablet Take 81 mg by mouth daily.  . carvedilol (COREG) 12.5 MG tablet Take 12.5 mg by mouth 2 (two) times daily with a meal.   . HUMALOG KWIKPEN 100 UNIT/ML KwikPen Inject 25 Units into the skin 2 (two) times a day.   Marland Kitchen HYDROcodone-acetaminophen (NORCO/VICODIN) 5-325 MG tablet One tablet every four hours as needed for acute pain.  Limit of five days per [place] statue.  . Insulin Glargine (BASAGLAR KWIKPEN) 100 UNIT/ML SOPN Inject 55 Units into the skin 2 (two) times daily.  . Multiple Vitamins-Minerals (CENTRUM SILVER PO) Take 1 tablet by mouth daily with breakfast.  . nitroGLYCERIN (NITROSTAT) 0.4 MG SL tablet ONE TABLET UNDER TONGUE AS NEEDED FOR CHEST PAIN EVERY 5 MINUTES CONTACT MD AS DIRECTED  . omeprazole (PRILOSEC) 20 MG capsule Take 20 mg by mouth daily.  . Pediatric Multivit-Minerals-C (FLINTSTONES GUMMIES) chewable tablet Chew 1 tablet by mouth daily.  . pravastatin (PRAVACHOL) 80 MG tablet Take 80 mg by mouth  daily.  Marland Kitchen PROAIR HFA 108 (90 Base) MCG/ACT inhaler INL 2 PFS PO Q 4 H PRN  . traZODone (DESYREL) 50 MG tablet Take 50 mg by mouth at bedtime.   No current facility-administered medications for this visit. (Other)      REVIEW OF SYSTEMS:    ALLERGIES Allergies  Allergen Reactions  . Altace [Ramipril]   . Atacand [Candesartan]   . Avandia [Rosiglitazone]   . Crestor [Rosuvastatin]   . Diovan [Valsartan]   . Lisinopril   . Lotensin [Benazepril]   . Norvasc [Amlodipine Besylate]   . Zetia [Ezetimibe]     PAST MEDICAL HISTORY Past Medical History:  Diagnosis Date  . Arthritis   . Carotid artery disease ([place])   . CKD (chronic kidney disease) stage 3, GFR 30-59 ml/min   . Coronary atherosclerosis of native coronary artery    Cypher DES LAD 2003, Taxus DES LAD 2004  . Essential hypertension, benign   . Mixed hyperlipidemia   . Type 2 diabetes mellitus ([place])    Past Surgical History:  Procedure Laterality Date  . CAROTID ENDARTERECTOMY Right 2004  . CATARACT EXTRACTION    . CHOLECYSTECTOMY    . Left breast mastectomy     25 yrs ago  . MASTECTOMY Left   . PARTIAL HYSTERECTOMY    . TONSILLECTOMY  FAMILY HISTORY Family History  Problem Relation Age of Onset  . CAD Mother     SOCIAL HISTORY Social History   Tobacco Use  . Smoking status: Former Smoker    Types: Cigarettes    Quit date: 03/15/2006    Years since quitting: 13.4  . Smokeless tobacco: Never Used  Vaping Use  . Vaping Use: Never assessed  Substance Use Topics  . Alcohol use: No    Alcohol/week: 0.0 standard drinks  . Drug use: No         OPHTHALMIC EXAM:  Base Eye Exam    Visual Acuity (Snellen - Linear)      Right Left   Dist cc CF @ 4' 20/400   Dist ph cc 20/400 NI       Tonometry (Tonopen, 2:31 PM)      Right Left   Pressure 22 20       Pupils      Pupils Dark Light Shape React APD   Right PERRL 4 3 Round Slow None   Left PERRL 4 3 Round Slow None       Visual Fields  (Counting fingers)      Left Right    Full Full       Extraocular Movement      Right Left    Full Full       Neuro/Psych    Oriented x3: Yes   Mood/Affect: Normal       Dilation    Both eyes: 1.0% Mydriacyl, 2.5% Phenylephrine @ 2:31 PM        Slit Lamp and Fundus Exam    External Exam      Right Left   External Normal Normal       Slit Lamp Exam      Right Left   Lids/Lashes Normal Normal   Conjunctiva/Sclera White and quiet White and quiet   Cornea Clear Clear   Anterior Chamber Deep and quiet Deep and quiet   Iris Round and reactive Round and reactive   Lens Posterior chamber intraocular lens Posterior chamber intraocular lens   Anterior Vitreous Normal Normal       Fundus Exam      Right Left   Posterior Vitreous Normal Normal   Disc Normal Normal   C/D Ratio 0.5 0.6   Macula Disciform scar Retinal pigment epithelial mottling, Hard drusen, no hemorrhage, no exudates, Early age related macular degeneration, Geographic atrophy   Vessels Normal Normal   Periphery Normal,, sector PRP inferiorly along all vein occlusion stable Normal          IMAGING AND PROCEDURES  Imaging and Procedures for 08/30/19  Color Fundus Photography Optos - OU - Both Eyes       Right Eye Progression has been stable. Macula : retinal pigment epithelium abnormalities. Periphery : normal observations.   Left Eye Progression has been stable. Disc findings include normal observations. Macula : drusen, geographic atrophy, retinal pigment epithelium abnormalities. Vessels : normal observations. Periphery : normal observations.   Notes Old disciform macular scar OD, centrally.  Stable over time.  Prior inferior branch retinal vein occlusion, status post PRP stable.  OS with geographic atrophy centrally, no change since last visit 1.5 years ago                ASSESSMENT/PLAN:  Stable branch retinal vein occlusion of right eye OD condition stable, no active lesions, no  active vasculopathy      ICD-10-CM  1. Exudative age-related macular degeneration of right eye with inactive choroidal neovascularization (HCC)  H35.3212 Color Fundus Photography Optos - OU - Both Eyes  2. Stable branch retinal vein occlusion of right eye  X83.3383 Color Fundus Photography Optos - OU - Both Eyes  3. Advanced nonexudative age-related macular degeneration of both eyes with subfoveal involvement  H35.3134     1.  2.  3.  Ophthalmic Meds Ordered this visit:  No orders of the defined types were placed in this encounter.      Return in about 1 year (around 08/29/2020) for DILATE OU, OCT.  There are no Patient Instructions on file for this visit.   Explained the diagnoses, plan, and follow up with the patient and they expressed understanding.  Patient expressed understanding of the importance of proper follow up care.   Clent Demark Eliot Popper M.D. Diseases & Surgery of the Retina and Vitreous [facility] [place] 08/30/19     Abbreviations: M myopia (nearsighted); A astigmatism; H hyperopia (farsighted); P presbyopia; Mrx spectacle prescription;  CTL contact lenses; OD right eye; OS left eye; OU both eyes  XT exotropia; ET esotropia; PEK punctate epithelial keratitis; PEE punctate epithelial erosions; DES dry eye syndrome; MGD meibomian gland dysfunction; ATs artificial tears; PFAT's preservative free artificial tears; [place] nuclear sclerotic cataract; PSC posterior subcapsular cataract; ERM epi-retinal membrane; PVD posterior vitreous detachment; RD retinal detachment; DM diabetes mellitus; DR diabetic retinopathy; NPDR non-proliferative diabetic retinopathy; PDR proliferative diabetic retinopathy; CSME clinically significant macular edema; DME diabetic macular edema; dbh dot blot hemorrhages; CWS cotton wool spot; POAG primary open angle glaucoma; C/D cup-to-disc ratio; HVF humphrey visual field; GVF goldmann visual field; OCT optical coherence tomography; IOP  intraocular pressure; BRVO Branch retinal vein occlusion; CRVO central retinal vein occlusion; CRAO central retinal artery occlusion; BRAO branch retinal artery occlusion; RT retinal tear; SB scleral buckle; PPV pars plana vitrectomy; VH Vitreous hemorrhage; PRP panretinal laser photocoagulation; IVK intravitreal kenalog; VMT vitreomacular traction; MH Macular hole;  NVD neovascularization of the disc; NVE neovascularization elsewhere; AREDS age related eye disease study; ARMD age related macular degeneration; POAG primary open angle glaucoma; EBMD epithelial/anterior basement membrane dystrophy; ACIOL anterior chamber intraocular lens; IOL intraocular lens; PCIOL posterior chamber intraocular lens; Phaco/IOL phacoemulsification with intraocular lens placement; [place] photorefractive keratectomy; LASIK laser assisted in situ keratomileusis; HTN hypertension; DM diabetes mellitus; COPD chronic obstructive pulmonary disease

## 2019-08-30 NOTE — Assessment & Plan Note (Signed)
Explained to the patient and reassured. No disease.

## 2019-09-20 DIAGNOSIS — I739 Peripheral vascular disease, unspecified: Secondary | ICD-10-CM | POA: Diagnosis not present

## 2019-09-20 DIAGNOSIS — M79671 Pain in right foot: Secondary | ICD-10-CM | POA: Diagnosis not present

## 2019-09-20 DIAGNOSIS — L110 Acquired keratosis follicularis: Secondary | ICD-10-CM | POA: Diagnosis not present

## 2019-09-20 DIAGNOSIS — M79672 Pain in left foot: Secondary | ICD-10-CM | POA: Diagnosis not present

## 2019-09-20 DIAGNOSIS — E1140 Type 2 diabetes mellitus with diabetic neuropathy, unspecified: Secondary | ICD-10-CM | POA: Diagnosis not present

## 2019-10-05 ENCOUNTER — Other Ambulatory Visit (HOSPITAL_COMMUNITY): Payer: Self-pay | Admitting: Internal Medicine

## 2019-10-05 ENCOUNTER — Ambulatory Visit (HOSPITAL_COMMUNITY)
Admission: RE | Admit: 2019-10-05 | Discharge: 2019-10-05 | Disposition: A | Discharge status: Home / Self Care | Payer: Medicare Other | Source: Ambulatory Visit | Attending: Internal Medicine | Admitting: Internal Medicine

## 2019-10-05 ENCOUNTER — Other Ambulatory Visit: Payer: Self-pay

## 2019-10-05 DIAGNOSIS — R0781 Pleurodynia: Secondary | ICD-10-CM | POA: Diagnosis not present

## 2019-10-05 DIAGNOSIS — J849 Interstitial pulmonary disease, unspecified: Secondary | ICD-10-CM | POA: Diagnosis not present

## 2019-10-05 DIAGNOSIS — D649 Anemia, unspecified: Secondary | ICD-10-CM | POA: Diagnosis not present

## 2019-10-05 DIAGNOSIS — I700 Atherosclerosis of aorta: Secondary | ICD-10-CM | POA: Diagnosis not present

## 2019-10-05 DIAGNOSIS — M954 Acquired deformity of chest and rib: Secondary | ICD-10-CM | POA: Diagnosis not present

## 2019-10-05 DIAGNOSIS — M40204 Unspecified kyphosis, thoracic region: Secondary | ICD-10-CM | POA: Diagnosis not present

## 2019-10-30 DIAGNOSIS — Z79899 Other long term (current) drug therapy: Secondary | ICD-10-CM | POA: Diagnosis not present

## 2019-10-30 DIAGNOSIS — N184 Chronic kidney disease, stage 4 (severe): Secondary | ICD-10-CM | POA: Diagnosis not present

## 2019-10-30 DIAGNOSIS — E1129 Type 2 diabetes mellitus with other diabetic kidney complication: Secondary | ICD-10-CM | POA: Diagnosis not present

## 2019-10-30 DIAGNOSIS — E785 Hyperlipidemia, unspecified: Secondary | ICD-10-CM | POA: Diagnosis not present

## 2019-10-30 DIAGNOSIS — D509 Iron deficiency anemia, unspecified: Secondary | ICD-10-CM | POA: Diagnosis not present

## 2019-11-01 ENCOUNTER — Encounter: Payer: Self-pay | Admitting: Orthopaedic Surgery

## 2019-11-01 ENCOUNTER — Ambulatory Visit: Payer: Medicare Other | Admitting: Orthopaedic Surgery

## 2019-11-01 ENCOUNTER — Other Ambulatory Visit: Payer: Self-pay

## 2019-11-01 DIAGNOSIS — M25562 Pain in left knee: Secondary | ICD-10-CM

## 2019-11-01 DIAGNOSIS — G8929 Other chronic pain: Secondary | ICD-10-CM

## 2019-11-01 DIAGNOSIS — M25561 Pain in right knee: Secondary | ICD-10-CM | POA: Diagnosis not present

## 2019-11-01 MED ORDER — HYDROCODONE-ACETAMINOPHEN 5-325 MG PO TABS: ORAL_TABLET | ORAL | 0 refills | Status: DC

## 2019-11-01 NOTE — Progress Notes (Signed)
PROCEDURE NOTE:  The patient requests injections of the left knee , verbal consent was obtained.  The left knee was prepped appropriately after time out was performed.   Sterile technique was observed and injection of 1 cc of Depo-Medrol 40 mg with several cc's of plain xylocaine. Anesthesia was provided by ethyl chloride and a 20-gauge needle was used to inject the knee area. The injection was tolerated well.  A band aid dressing was applied.  The patient was advised to apply ice later today and tomorrow to the injection sight as needed.  PROCEDURE NOTE:  The patient requests injections of the right knee , verbal consent was obtained.  The right knee was prepped appropriately after time out was performed.   Sterile technique was observed and injection of 1 cc of Depo-Medrol 40 mg with several cc's of plain xylocaine. Anesthesia was provided by ethyl chloride and a 20-gauge needle was used to inject the knee area. The injection was tolerated well.  A band aid dressing was applied.  The patient was advised to apply ice later today and tomorrow to the injection sight as needed.  See in two months.  Call if any problem.  Precautions discussed.   Electronically Signed Sanjuana Kava, MD 8/19/202110:40 AM

## 2019-11-06 DIAGNOSIS — E1122 Type 2 diabetes mellitus with diabetic chronic kidney disease: Secondary | ICD-10-CM | POA: Diagnosis not present

## 2019-11-06 DIAGNOSIS — R7309 Other abnormal glucose: Secondary | ICD-10-CM | POA: Diagnosis not present

## 2019-11-06 DIAGNOSIS — D509 Iron deficiency anemia, unspecified: Secondary | ICD-10-CM | POA: Diagnosis not present

## 2019-11-06 DIAGNOSIS — N183 Chronic kidney disease, stage 3 unspecified: Secondary | ICD-10-CM | POA: Diagnosis not present

## 2019-11-29 ENCOUNTER — Other Ambulatory Visit: Payer: Self-pay

## 2019-11-29 ENCOUNTER — Ambulatory Visit: Payer: Medicare Other | Admitting: Orthopaedic Surgery

## 2019-11-29 ENCOUNTER — Encounter: Payer: Self-pay | Admitting: Orthopaedic Surgery

## 2019-11-29 VITALS — Ht 65.0 in | Wt 213.0 lb

## 2019-11-29 DIAGNOSIS — G8929 Other chronic pain: Secondary | ICD-10-CM | POA: Diagnosis not present

## 2019-11-29 DIAGNOSIS — M79671 Pain in right foot: Secondary | ICD-10-CM | POA: Diagnosis not present

## 2019-11-29 DIAGNOSIS — M25561 Pain in right knee: Secondary | ICD-10-CM

## 2019-11-29 DIAGNOSIS — M25562 Pain in left knee: Secondary | ICD-10-CM

## 2019-11-29 DIAGNOSIS — M79672 Pain in left foot: Secondary | ICD-10-CM | POA: Diagnosis not present

## 2019-11-29 DIAGNOSIS — E1140 Type 2 diabetes mellitus with diabetic neuropathy, unspecified: Secondary | ICD-10-CM | POA: Diagnosis not present

## 2019-11-29 DIAGNOSIS — L110 Acquired keratosis follicularis: Secondary | ICD-10-CM | POA: Diagnosis not present

## 2019-11-29 DIAGNOSIS — I739 Peripheral vascular disease, unspecified: Secondary | ICD-10-CM | POA: Diagnosis not present

## 2019-11-29 NOTE — Progress Notes (Signed)
PROCEDURE NOTE:  The patient requests injections of the left knee , verbal consent was obtained.  The left knee was prepped appropriately after time out was performed.   Sterile technique was observed and injection of 1 cc of Depo-Medrol 40 mg with several cc's of plain xylocaine. Anesthesia was provided by ethyl chloride and a 20-gauge needle was used to inject the knee area. The injection was tolerated well.  A band aid dressing was applied.  The patient was advised to apply ice later today and tomorrow to the injection sight as needed.  PROCEDURE NOTE:  The patient requests injections of the right knee , verbal consent was obtained.  The right knee was prepped appropriately after time out was performed.   Sterile technique was observed and injection of 1 cc of Depo-Medrol 40 mg with several cc's of plain xylocaine. Anesthesia was provided by ethyl chloride and a 20-gauge needle was used to inject the knee area. The injection was tolerated well.  A band aid dressing was applied.  The patient was advised to apply ice later today and tomorrow to the injection sight as needed.  Return in two months.  Electronically Signed Sanjuana Kava, MD 9/16/202110:34 AM

## 2020-01-01 DIAGNOSIS — Z23 Encounter for immunization: Secondary | ICD-10-CM | POA: Diagnosis not present

## 2020-01-22 ENCOUNTER — Other Ambulatory Visit: Payer: Self-pay

## 2020-01-22 ENCOUNTER — Encounter: Payer: Self-pay | Admitting: Orthopaedic Surgery

## 2020-01-22 ENCOUNTER — Ambulatory Visit: Payer: Medicare Other | Admitting: Orthopaedic Surgery

## 2020-01-22 DIAGNOSIS — M25561 Pain in right knee: Secondary | ICD-10-CM

## 2020-01-22 DIAGNOSIS — G8929 Other chronic pain: Secondary | ICD-10-CM | POA: Diagnosis not present

## 2020-01-22 DIAGNOSIS — M25562 Pain in left knee: Secondary | ICD-10-CM

## 2020-01-22 NOTE — Progress Notes (Signed)
PROCEDURE NOTE:  The patient requests injections of the right knee , verbal consent was obtained.  The right knee was prepped appropriately after time out was performed.   Sterile technique was observed and injection of 1 cc of Depo-Medrol 40 mg with several cc's of plain xylocaine. Anesthesia was provided by ethyl chloride and a 20-gauge needle was used to inject the knee area. The injection was tolerated well.  A band aid dressing was applied.  The patient was advised to apply ice later today and tomorrow to the injection sight as needed.  PROCEDURE NOTE:  The patient requests injections of the left knee , verbal consent was obtained.  The left knee was prepped appropriately after time out was performed.   Sterile technique was observed and injection of 1 cc of Depo-Medrol 40 mg with several cc's of plain xylocaine. Anesthesia was provided by ethyl chloride and a 20-gauge needle was used to inject the knee area. The injection was tolerated well.  A band aid dressing was applied.  The patient was advised to apply ice later today and tomorrow to the injection sight as needed.   Return in two months.  Call if any problem.  Precautions discussed.   Electronically [place], MD 11/9/20211:51 PM

## 2020-01-29 ENCOUNTER — Ambulatory Visit: Payer: Medicare Other | Admitting: Orthopaedic Surgery

## 2020-02-11 DIAGNOSIS — E1129 Type 2 diabetes mellitus with other diabetic kidney complication: Secondary | ICD-10-CM | POA: Diagnosis not present

## 2020-02-11 DIAGNOSIS — D508 Other iron deficiency anemias: Secondary | ICD-10-CM | POA: Diagnosis not present

## 2020-02-11 DIAGNOSIS — N184 Chronic kidney disease, stage 4 (severe): Secondary | ICD-10-CM | POA: Diagnosis not present

## 2020-02-11 DIAGNOSIS — Z79899 Other long term (current) drug therapy: Secondary | ICD-10-CM | POA: Diagnosis not present

## 2020-02-14 DIAGNOSIS — L110 Acquired keratosis follicularis: Secondary | ICD-10-CM | POA: Diagnosis not present

## 2020-02-14 DIAGNOSIS — M79671 Pain in right foot: Secondary | ICD-10-CM | POA: Diagnosis not present

## 2020-02-14 DIAGNOSIS — E1140 Type 2 diabetes mellitus with diabetic neuropathy, unspecified: Secondary | ICD-10-CM | POA: Diagnosis not present

## 2020-02-14 DIAGNOSIS — I739 Peripheral vascular disease, unspecified: Secondary | ICD-10-CM | POA: Diagnosis not present

## 2020-02-14 DIAGNOSIS — M79672 Pain in left foot: Secondary | ICD-10-CM | POA: Diagnosis not present

## 2020-02-18 DIAGNOSIS — D509 Iron deficiency anemia, unspecified: Secondary | ICD-10-CM | POA: Diagnosis not present

## 2020-02-18 DIAGNOSIS — E1122 Type 2 diabetes mellitus with diabetic chronic kidney disease: Secondary | ICD-10-CM | POA: Diagnosis not present

## 2020-02-18 DIAGNOSIS — N184 Chronic kidney disease, stage 4 (severe): Secondary | ICD-10-CM | POA: Diagnosis not present

## 2020-03-25 ENCOUNTER — Ambulatory Visit: Payer: Medicare Other | Admitting: Orthopaedic Surgery

## 2020-03-25 ENCOUNTER — Encounter: Payer: Self-pay | Admitting: Orthopaedic Surgery

## 2020-03-25 ENCOUNTER — Other Ambulatory Visit: Payer: Self-pay

## 2020-03-25 DIAGNOSIS — M545 Low back pain, unspecified: Secondary | ICD-10-CM

## 2020-03-25 DIAGNOSIS — M25561 Pain in right knee: Secondary | ICD-10-CM | POA: Diagnosis not present

## 2020-03-25 DIAGNOSIS — G8929 Other chronic pain: Secondary | ICD-10-CM | POA: Diagnosis not present

## 2020-03-25 MED ORDER — HYDROCODONE-ACETAMINOPHEN 5-325 MG PO TABS: ORAL_TABLET | ORAL | 0 refills | Status: DC

## 2020-03-25 NOTE — Progress Notes (Signed)
PROCEDURE NOTE:  The patient requests injections of the right knee , verbal consent was obtained.  The right knee was prepped appropriately after time out was performed.   Sterile technique was observed and injection of 1 cc of Depo-Medrol 40 mg with several cc's of plain xylocaine. Anesthesia was provided by ethyl chloride and a 20-gauge needle was used to inject the knee area. The injection was tolerated well.  A band aid dressing was applied.  The patient was advised to apply ice later today and tomorrow to the injection sight as needed.  She has trigger point in the lower back on the left.  I injected the trigger point with 1% Xylocaine, 1 cc DepoMedrol 40 by sterile technique tolerated well.  Return in two months.  I have reviewed the [place] web site prior to prescribing narcotic medicine for this patient.   Electronically Signed Sanjuana Kava, MD 1/11/20221:46 PM

## 2020-04-24 DIAGNOSIS — E1140 Type 2 diabetes mellitus with diabetic neuropathy, unspecified: Secondary | ICD-10-CM | POA: Diagnosis not present

## 2020-04-24 DIAGNOSIS — L110 Acquired keratosis follicularis: Secondary | ICD-10-CM | POA: Diagnosis not present

## 2020-04-24 DIAGNOSIS — I739 Peripheral vascular disease, unspecified: Secondary | ICD-10-CM | POA: Diagnosis not present

## 2020-04-24 DIAGNOSIS — M79671 Pain in right foot: Secondary | ICD-10-CM | POA: Diagnosis not present

## 2020-04-24 DIAGNOSIS — M79672 Pain in left foot: Secondary | ICD-10-CM | POA: Diagnosis not present

## 2020-05-12 DIAGNOSIS — Z79899 Other long term (current) drug therapy: Secondary | ICD-10-CM | POA: Diagnosis not present

## 2020-05-12 DIAGNOSIS — D509 Iron deficiency anemia, unspecified: Secondary | ICD-10-CM | POA: Diagnosis not present

## 2020-05-12 DIAGNOSIS — N184 Chronic kidney disease, stage 4 (severe): Secondary | ICD-10-CM | POA: Diagnosis not present

## 2020-05-12 DIAGNOSIS — E1129 Type 2 diabetes mellitus with other diabetic kidney complication: Secondary | ICD-10-CM | POA: Diagnosis not present

## 2020-05-12 DIAGNOSIS — M1990 Unspecified osteoarthritis, unspecified site: Secondary | ICD-10-CM | POA: Diagnosis not present

## 2020-05-20 DIAGNOSIS — I10 Essential (primary) hypertension: Secondary | ICD-10-CM | POA: Diagnosis not present

## 2020-05-20 DIAGNOSIS — E1122 Type 2 diabetes mellitus with diabetic chronic kidney disease: Secondary | ICD-10-CM | POA: Diagnosis not present

## 2020-05-20 DIAGNOSIS — R7309 Other abnormal glucose: Secondary | ICD-10-CM | POA: Diagnosis not present

## 2020-05-20 DIAGNOSIS — N184 Chronic kidney disease, stage 4 (severe): Secondary | ICD-10-CM | POA: Diagnosis not present

## 2020-05-27 ENCOUNTER — Ambulatory Visit: Payer: Medicare Other | Admitting: Orthopaedic Surgery

## 2020-05-27 ENCOUNTER — Encounter: Payer: Self-pay | Admitting: Orthopaedic Surgery

## 2020-05-27 ENCOUNTER — Other Ambulatory Visit: Payer: Self-pay

## 2020-05-27 DIAGNOSIS — M25561 Pain in right knee: Secondary | ICD-10-CM

## 2020-05-27 DIAGNOSIS — G8929 Other chronic pain: Secondary | ICD-10-CM | POA: Diagnosis not present

## 2020-05-27 DIAGNOSIS — M25562 Pain in left knee: Secondary | ICD-10-CM

## 2020-05-27 MED ORDER — HYDROCODONE-ACETAMINOPHEN 5-325 MG PO TABS
ORAL_TABLET | ORAL | 0 refills | Status: DC
Start: 2020-05-27 — End: 2020-09-30

## 2020-05-27 NOTE — Progress Notes (Signed)
PROCEDURE NOTE:  The patient requests injections of the right knee , verbal consent was obtained.  The right knee was prepped appropriately after time out was performed.   Sterile technique was observed and injection of 1 cc of Celestone 6 mg with several cc's of plain xylocaine. Anesthesia was provided by ethyl chloride and a 20-gauge needle was used to inject the knee area. The injection was tolerated well.  A band aid dressing was applied.  The patient was advised to apply ice later today and tomorrow to the injection sight as needed.  PROCEDURE NOTE:  The patient requests injections of the left knee , verbal consent was obtained.  The left knee was prepped appropriately after time out was performed.   Sterile technique was observed and injection of 1 cc of Celestone 6 mg with several cc's of plain xylocaine. Anesthesia was provided by ethyl chloride and a 20-gauge needle was used to inject the knee area. The injection was tolerated well.  A band aid dressing was applied.  The patient was advised to apply ice later today and tomorrow to the injection sight as needed.  I have reviewed the [place] web site prior to prescribing narcotic medicine for this patient.   Return in two months.  Electronically Signed Sanjuana Kava, MD 3/15/20222:01 PM

## 2020-07-29 ENCOUNTER — Other Ambulatory Visit: Payer: Self-pay

## 2020-07-29 ENCOUNTER — Ambulatory Visit: Payer: Medicare Other | Admitting: Orthopaedic Surgery

## 2020-07-29 ENCOUNTER — Encounter: Payer: Self-pay | Admitting: Orthopaedic Surgery

## 2020-07-29 DIAGNOSIS — M25562 Pain in left knee: Secondary | ICD-10-CM

## 2020-07-29 DIAGNOSIS — M25561 Pain in right knee: Secondary | ICD-10-CM | POA: Diagnosis not present

## 2020-07-29 DIAGNOSIS — G8929 Other chronic pain: Secondary | ICD-10-CM | POA: Diagnosis not present

## 2020-07-29 NOTE — Progress Notes (Signed)
PROCEDURE NOTE:  The patient requests injections of the left knee , verbal consent was obtained.  The left knee was prepped appropriately after time out was performed.   Sterile technique was observed and injection of 1 cc of Celestone 6 mg with several cc's of plain xylocaine. Anesthesia was provided by ethyl chloride and a 20-gauge needle was used to inject the knee area. The injection was tolerated well.  A band aid dressing was applied.  The patient was advised to apply ice later today and tomorrow to the injection sight as needed.  PROCEDURE NOTE:  The patient requests injections of the right knee , verbal consent was obtained.  The right knee was prepped appropriately after time out was performed.   Sterile technique was observed and injection of 1 cc of Celestone 6 mg with several cc's of plain xylocaine. Anesthesia was provided by ethyl chloride and a 20-gauge needle was used to inject the knee area. The injection was tolerated well.  A band aid dressing was applied.  The patient was advised to apply ice later today and tomorrow to the injection sight as needed.  Return in two months.  Call if any problem.  Precautions discussed.   Electronically Signed Sanjuana Kava, MD 5/17/20221:58 PM

## 2020-08-12 DIAGNOSIS — N184 Chronic kidney disease, stage 4 (severe): Secondary | ICD-10-CM | POA: Diagnosis not present

## 2020-08-12 DIAGNOSIS — E1129 Type 2 diabetes mellitus with other diabetic kidney complication: Secondary | ICD-10-CM | POA: Diagnosis not present

## 2020-08-12 DIAGNOSIS — I10 Essential (primary) hypertension: Secondary | ICD-10-CM | POA: Diagnosis not present

## 2020-08-12 DIAGNOSIS — Z79899 Other long term (current) drug therapy: Secondary | ICD-10-CM | POA: Diagnosis not present

## 2020-08-19 DIAGNOSIS — I10 Essential (primary) hypertension: Secondary | ICD-10-CM | POA: Diagnosis not present

## 2020-08-19 DIAGNOSIS — N184 Chronic kidney disease, stage 4 (severe): Secondary | ICD-10-CM | POA: Diagnosis not present

## 2020-08-19 DIAGNOSIS — E785 Hyperlipidemia, unspecified: Secondary | ICD-10-CM | POA: Diagnosis not present

## 2020-08-19 DIAGNOSIS — E1122 Type 2 diabetes mellitus with diabetic chronic kidney disease: Secondary | ICD-10-CM | POA: Diagnosis not present

## 2020-09-01 ENCOUNTER — Encounter (INDEPENDENT_AMBULATORY_CARE_PROVIDER_SITE_OTHER): Payer: Medicare Other | Admitting: Ophthalmology

## 2020-09-04 ENCOUNTER — Encounter (INDEPENDENT_AMBULATORY_CARE_PROVIDER_SITE_OTHER): Payer: Medicare Other | Admitting: Ophthalmology

## 2020-09-11 ENCOUNTER — Ambulatory Visit (INDEPENDENT_AMBULATORY_CARE_PROVIDER_SITE_OTHER): Payer: Medicare Other | Admitting: Ophthalmology

## 2020-09-11 ENCOUNTER — Other Ambulatory Visit: Payer: Self-pay

## 2020-09-11 ENCOUNTER — Encounter (INDEPENDENT_AMBULATORY_CARE_PROVIDER_SITE_OTHER): Payer: Self-pay | Admitting: Ophthalmology

## 2020-09-11 DIAGNOSIS — H353212 Exudative age-related macular degeneration, right eye, with inactive choroidal neovascularization: Secondary | ICD-10-CM | POA: Diagnosis not present

## 2020-09-11 DIAGNOSIS — H353134 Nonexudative age-related macular degeneration, bilateral, advanced atrophic with subfoveal involvement: Secondary | ICD-10-CM | POA: Diagnosis not present

## 2020-09-11 NOTE — Assessment & Plan Note (Signed)
Large subfoveal disciform scar, no active edges

## 2020-09-11 NOTE — Assessment & Plan Note (Signed)
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

## 2020-09-11 NOTE — Progress Notes (Signed)
09/11/2020     CHIEF COMPLAINT Patient presents for Retina Follow Up (1 Year F/U OU//Pt denies noticeable changes to [place] OU since last visit. Pt denies ocular pain, flashes of light, or floaters OU. //)   HISTORY OF PRESENT ILLNESS: Marie House is a 86 y.o. female who presents to the clinic today for:   HPI     Retina Follow Up           Diagnosis: Wet AMD   Laterality: right eye   Onset: 1 year ago   Severity: moderate   Duration: 1 year   Course: stable   Comments: 1 Year F/U OU  Pt denies noticeable changes to [place] OU since last visit. Pt denies ocular pain, flashes of light, or floaters OU.          Last edited by Milly Jakob, [place] on 09/11/2020  2:18 PM.      Referring physician: Asencion Noble, MD [address] [place],   [address]  HISTORICAL INFORMATION:   Selected notes from the MEDICAL RECORD NUMBER       CURRENT MEDICATIONS: No current outpatient medications on file. (Ophthalmic Drugs)   No current facility-administered medications for this visit. (Ophthalmic Drugs)   Current Outpatient Medications (Other)  Medication Sig   ALPRAZolam (XANAX) 0.5 MG tablet Take 0.5 mg by mouth 3 (three) times daily as needed for anxiety.   aspirin EC 81 MG tablet Take 81 mg by mouth daily.   carvedilol (COREG) 12.5 MG tablet Take 12.5 mg by mouth 2 (two) times daily with a meal.    HUMALOG KWIKPEN 100 UNIT/ML KwikPen Inject 25 Units into the skin 2 (two) times a day.    hydrochlorothiazide (HYDRODIURIL) 25 MG tablet Take 25 mg by mouth daily.   HYDROcodone-acetaminophen (NORCO/VICODIN) 5-325 MG tablet One tablet every four hours as needed for acute pain.  Limit of five days per [place].   Insulin Glargine (BASAGLAR KWIKPEN) 100 UNIT/ML SOPN Inject 55 Units into the skin 2 (two) times daily.   Multiple Vitamins-Minerals (CENTRUM SILVER PO) Take 1 tablet by mouth daily with breakfast.   nitroGLYCERIN (NITROSTAT) 0.4 MG SL tablet ONE TABLET UNDER  TONGUE AS NEEDED FOR CHEST PAIN EVERY 5 MINUTES CONTACT MD AS DIRECTED   omeprazole (PRILOSEC) 20 MG capsule Take 20 mg by mouth daily.   Pediatric Multivit-Minerals-C (FLINTSTONES GUMMIES) chewable tablet Chew 1 tablet by mouth daily.   pravastatin (PRAVACHOL) 80 MG tablet Take 80 mg by mouth daily.   PROAIR HFA 108 (90 Base) MCG/ACT inhaler INL 2 PFS PO Q 4 H PRN   traZODone (DESYREL) 50 MG tablet Take 50 mg by mouth at bedtime.   No current facility-administered medications for this visit. (Other)      REVIEW OF SYSTEMS:    ALLERGIES Allergies  Allergen Reactions   Altace [Ramipril]    Atacand [Candesartan]    Avandia [Rosiglitazone]    Crestor [Rosuvastatin]    Diovan [Valsartan]    Lisinopril    Lotensin [Benazepril]    Norvasc [Amlodipine Besylate]    Zetia [Ezetimibe]     PAST MEDICAL HISTORY Past Medical History:  Diagnosis Date   Arthritis    Carotid artery disease ([place])    CKD (chronic kidney disease) stage 3, GFR 30-59 ml/min (HCC)    Coronary atherosclerosis of native coronary artery    Cypher DES LAD 2003, Taxus DES LAD 2004   Essential hypertension, benign    Mixed hyperlipidemia  Type 2 diabetes mellitus (HCC)    Past Surgical History:  Procedure Laterality Date   CAROTID ENDARTERECTOMY Right 2004   CATARACT EXTRACTION     CHOLECYSTECTOMY     Left breast mastectomy     25 yrs ago   MASTECTOMY Left    PARTIAL HYSTERECTOMY     TONSILLECTOMY      FAMILY HISTORY Family History  Problem Relation Age of Onset   CAD Mother     SOCIAL HISTORY Social History   Tobacco Use   Smoking status: Former    Pack years: 0.00    Types: Cigarettes    Quit date: 03/15/2006    Years since quitting: 14.5   Smokeless tobacco: Never  Substance Use Topics   Alcohol use: No    Alcohol/week: 0.0 standard drinks   Drug use: No         OPHTHALMIC EXAM:  Base Eye Exam     Visual Acuity (ETDRS)       Right Left   Dist cc 20/400 20/400   Dist ph cc  NI 20/200    Correction: Glasses         Tonometry (Tonopen, 2:22 PM)       Right Left   Pressure 17 15         Pupils       Pupils Dark Light Shape React APD   Right PERRL 5 4 Round Slow None   Left PERRL 5 4 Round Slow None         Visual Fields (Counting fingers)       Left Right    Full Full         Extraocular Movement       Right Left    Full Full         Neuro/Psych     Oriented x3: Yes   Mood/Affect: Normal         Dilation     Both eyes: 1.0% Mydriacyl, 2.5% Phenylephrine @ 2:22 PM           Slit Lamp and Fundus Exam     External Exam       Right Left   External Normal Normal         Slit Lamp Exam       Right Left   Lids/Lashes Normal Normal   Conjunctiva/Sclera White and quiet White and quiet   Cornea Clear Clear   Anterior Chamber Deep and quiet Deep and quiet   Iris Round and reactive Round and reactive   Lens Posterior chamber intraocular lens Posterior chamber intraocular lens   Anterior Vitreous Normal Normal         Fundus Exam       Right Left   Posterior Vitreous Normal Normal   Disc Normal Normal   C/D Ratio 0.5 0.6   Macula Disciform scar, large fibrotic 8-10 disc areas in size Retinal pigment epithelial mottling, Hard drusen, no hemorrhage, no exudates, Early age related macular degeneration, Geographic atrophy 6 disc areas in size   Vessels Normal Normal   Periphery Normal,, sector PRP inferiorly along all vein occlusion stable Normal            IMAGING AND PROCEDURES  Imaging and Procedures for 09/11/20  OCT, Retina - OU - Both Eyes       Right Eye Quality was good. Scan locations included subfoveal. Central Foveal Thickness: 375. Progression has been stable. Findings include abnormal foveal contour, subretinal scarring, no  IRF, no SRF, inner retinal atrophy, outer retinal atrophy, central retinal atrophy.   Left Eye Quality was good. Scan locations included subfoveal. Central Foveal  Thickness: 274. Progression has been stable. Findings include abnormal foveal contour, subretinal scarring, no SRF, no IRF, central retinal atrophy, inner retinal atrophy, outer retinal atrophy.   Notes No signs of active CNVM.  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 Exudative age-related macular degeneration of right eye with inactive choroidal neovascularization (HCC)  H35.3212 OCT, Retina - OU - Both Eyes    2. Advanced nonexudative age-related macular degeneration of both eyes with subfoveal involvement  H35.3134       1.  Bilateral disciform scar with mostly geographic atrophy subfoveal OS at this time.  2.  No signs of CNVM active OU.  3.  Ophthalmic Meds Ordered this visit:  No orders of the defined types were placed in this encounter.      Return in about 2 years (around 09/12/2022) for DILATE OU, COLOR FP, OCT.  There are no Patient Instructions on file for this visit.   Explained the diagnoses, plan, and follow up with the patient and they expressed understanding.  Patient expressed understanding of the importance of proper follow up care.   Clent Demark Zareena Willis M.D. Diseases & Surgery of the Retina and Vitreous [facility] [place] 09/11/20     Abbreviations: M myopia (nearsighted); A astigmatism; H hyperopia (farsighted); P presbyopia; Mrx spectacle prescription;  CTL contact lenses; OD right eye; OS left eye; OU both eyes  XT exotropia; ET esotropia; PEK punctate epithelial keratitis; PEE punctate epithelial erosions; DES dry eye syndrome; MGD meibomian gland dysfunction;  ATs artificial tears; PFAT's preservative free artificial tears; [place] nuclear sclerotic cataract; PSC posterior subcapsular cataract; ERM epi-retinal membrane; PVD posterior vitreous detachment; RD retinal detachment; DM diabetes mellitus; DR diabetic retinopathy; NPDR non-proliferative diabetic retinopathy; PDR proliferative diabetic retinopathy; CSME clinically significant macular edema; DME diabetic macular edema; dbh dot blot hemorrhages; CWS cotton wool spot; POAG primary open angle glaucoma; C/D cup-to-disc ratio; HVF humphrey visual field; GVF goldmann visual field; OCT optical coherence tomography; IOP intraocular pressure; BRVO Branch retinal vein occlusion; CRVO central retinal vein occlusion; CRAO central retinal artery occlusion; BRAO branch retinal artery occlusion; RT retinal tear; SB scleral buckle; PPV pars plana vitrectomy; VH Vitreous hemorrhage; PRP panretinal laser photocoagulation; IVK intravitreal kenalog; VMT vitreomacular traction; MH Macular hole;  NVD neovascularization of the disc; NVE neovascularization elsewhere; AREDS age related eye disease study; ARMD age related macular degeneration; POAG primary open angle glaucoma; EBMD epithelial/anterior basement membrane dystrophy; ACIOL anterior chamber intraocular lens; IOL intraocular lens; PCIOL posterior chamber intraocular lens; Phaco/IOL phacoemulsification with intraocular lens placement; [place] photorefractive keratectomy; LASIK laser assisted in situ keratomileusis; HTN hypertension; DM diabetes mellitus; COPD chronic obstructive pulmonary disease

## 2020-09-30 ENCOUNTER — Other Ambulatory Visit: Payer: Self-pay

## 2020-09-30 ENCOUNTER — Encounter: Payer: Self-pay | Admitting: Orthopaedic Surgery

## 2020-09-30 ENCOUNTER — Ambulatory Visit: Payer: Medicare Other | Admitting: Orthopaedic Surgery

## 2020-09-30 DIAGNOSIS — M25562 Pain in left knee: Secondary | ICD-10-CM | POA: Diagnosis not present

## 2020-09-30 DIAGNOSIS — G8929 Other chronic pain: Secondary | ICD-10-CM

## 2020-09-30 DIAGNOSIS — M25561 Pain in right knee: Secondary | ICD-10-CM | POA: Diagnosis not present

## 2020-09-30 MED ORDER — HYDROCODONE-ACETAMINOPHEN 5-325 MG PO TABS: ORAL_TABLET | ORAL | 0 refills | Status: DC

## 2020-09-30 NOTE — Progress Notes (Signed)
CC:  I have pain of my left knee. I would like an injection.  The patient has chronic pain of the left knee.  There is no recent trauma.  There is no redness.  Injections in the past have helped.  The knee has no redness, has an effusion and crepitus present.  ROM of the left knee is 0-105.  Impression:  Chronic knee pain left  Return: 2 months  PROCEDURE NOTE:  The patient requests injections of the left knee, verbal consent was obtained.  The left knee was prepped appropriately after time out was performed.   Sterile technique was observed and injection of 1 cc of Celestone with several cc's of plain xylocaine. Anesthesia was provided by ethyl chloride and a 20-gauge needle was used to inject the knee area. The injection was tolerated well.  A band aid dressing was applied.  The patient was advised to apply ice later today and tomorrow to the injection sight as needed.  CC:  I have pain of my right knee. I would like an injection.  The patient has chronic pain of the right knee.  There is no recent trauma.  There is no redness.  Injections in the past have helped.  The knee has no redness, has an effusion and crepitus present.  ROM of the right knee is 0-105.  Impression:  Chronic knee pain right  Return: 2 months  PROCEDURE NOTE:  The patient requests injections of the right knee , verbal consent was obtained.  The right knee was prepped appropriately after time out was performed.   Sterile technique was observed and injection of 1 cc of Celestone 6mg  with several cc's of plain xylocaine. Anesthesia was provided by ethyl chloride and a 20-gauge needle was used to inject the knee area. The injection was tolerated well.  A band aid dressing was applied.  The patient was advised to apply ice later today and tomorrow to the injection sight as needed.  I have reviewed the [place] web site prior to prescribing narcotic medicine for  this patient.  Return in two months.  Call if any problem.  Precautions discussed.  Electronically Signed Sanjuana Kava, MD 7/19/20221:41 PM

## 2020-10-22 DIAGNOSIS — E1140 Type 2 diabetes mellitus with diabetic neuropathy, unspecified: Secondary | ICD-10-CM | POA: Diagnosis not present

## 2020-10-22 DIAGNOSIS — M79671 Pain in right foot: Secondary | ICD-10-CM | POA: Diagnosis not present

## 2020-10-22 DIAGNOSIS — I739 Peripheral vascular disease, unspecified: Secondary | ICD-10-CM | POA: Diagnosis not present

## 2020-10-22 DIAGNOSIS — M79672 Pain in left foot: Secondary | ICD-10-CM | POA: Diagnosis not present

## 2020-10-22 DIAGNOSIS — L110 Acquired keratosis follicularis: Secondary | ICD-10-CM | POA: Diagnosis not present

## 2020-10-30 DIAGNOSIS — L570 Actinic keratosis: Secondary | ICD-10-CM | POA: Diagnosis not present

## 2020-10-30 DIAGNOSIS — C44622 Squamous cell carcinoma of skin of right upper limb, including shoulder: Secondary | ICD-10-CM | POA: Diagnosis not present

## 2020-10-30 DIAGNOSIS — X32XXXD Exposure to sunlight, subsequent encounter: Secondary | ICD-10-CM | POA: Diagnosis not present

## 2020-11-13 DIAGNOSIS — N184 Chronic kidney disease, stage 4 (severe): Secondary | ICD-10-CM | POA: Diagnosis not present

## 2020-11-13 DIAGNOSIS — I2510 Atherosclerotic heart disease of native coronary artery without angina pectoris: Secondary | ICD-10-CM | POA: Diagnosis not present

## 2020-11-13 DIAGNOSIS — E785 Hyperlipidemia, unspecified: Secondary | ICD-10-CM | POA: Diagnosis not present

## 2020-11-13 DIAGNOSIS — E1129 Type 2 diabetes mellitus with other diabetic kidney complication: Secondary | ICD-10-CM | POA: Diagnosis not present

## 2020-11-13 DIAGNOSIS — I10 Essential (primary) hypertension: Secondary | ICD-10-CM | POA: Diagnosis not present

## 2020-11-20 DIAGNOSIS — E1122 Type 2 diabetes mellitus with diabetic chronic kidney disease: Secondary | ICD-10-CM | POA: Diagnosis not present

## 2020-11-20 DIAGNOSIS — E875 Hyperkalemia: Secondary | ICD-10-CM | POA: Diagnosis not present

## 2020-11-20 DIAGNOSIS — N184 Chronic kidney disease, stage 4 (severe): Secondary | ICD-10-CM | POA: Diagnosis not present

## 2020-11-20 DIAGNOSIS — R7309 Other abnormal glucose: Secondary | ICD-10-CM | POA: Diagnosis not present

## 2020-11-21 DIAGNOSIS — E875 Hyperkalemia: Secondary | ICD-10-CM | POA: Diagnosis not present

## 2020-11-27 DIAGNOSIS — Z85828 Personal history of other malignant neoplasm of skin: Secondary | ICD-10-CM | POA: Diagnosis not present

## 2020-11-27 DIAGNOSIS — Z08 Encounter for follow-up examination after completed treatment for malignant neoplasm: Secondary | ICD-10-CM | POA: Diagnosis not present

## 2020-11-27 DIAGNOSIS — L821 Other seborrheic keratosis: Secondary | ICD-10-CM | POA: Diagnosis not present

## 2020-12-02 ENCOUNTER — Ambulatory Visit: Payer: Medicare Other | Admitting: Orthopaedic Surgery

## 2020-12-02 ENCOUNTER — Encounter: Payer: Self-pay | Admitting: Orthopaedic Surgery

## 2020-12-02 ENCOUNTER — Other Ambulatory Visit: Payer: Self-pay

## 2020-12-02 DIAGNOSIS — M25561 Pain in right knee: Secondary | ICD-10-CM | POA: Diagnosis not present

## 2020-12-02 DIAGNOSIS — M25562 Pain in left knee: Secondary | ICD-10-CM | POA: Diagnosis not present

## 2020-12-02 DIAGNOSIS — G8929 Other chronic pain: Secondary | ICD-10-CM

## 2020-12-02 MED ORDER — HYDROCODONE-ACETAMINOPHEN 5-325 MG PO TABS: ORAL_TABLET | ORAL | 0 refills | Status: DC

## 2020-12-02 NOTE — Progress Notes (Signed)
PROCEDURE NOTE:  The patient requests injections of the right knee , verbal consent was obtained.  The right knee was prepped appropriately after time out was performed.   Sterile technique was observed and injection of 1 cc of Celestone 6mg  with several cc's of plain xylocaine. Anesthesia was provided by ethyl chloride and a 20-gauge needle was used to inject the knee area. The injection was tolerated well.  A band aid dressing was applied.  The patient was advised to apply ice later today and tomorrow to the injection sight as needed.   PROCEDURE NOTE:  The patient requests injections of the left knee , verbal consent was obtained.  The left knee was prepped appropriately after time out was performed.   Sterile technique was observed and injection of 1 cc of Celestone 6 mg with several cc's of plain xylocaine. Anesthesia was provided by ethyl chloride and a 20-gauge needle was used to inject the knee area. The injection was tolerated well.  A band aid dressing was applied.  The patient was advised to apply ice later today and tomorrow to the injection sight as needed.   Return in two months.  I have reviewed the [place] web site prior to prescribing narcotic medicine for this patient.  Call if any problem.  Precautions discussed.  Electronically Signed Sanjuana Kava, MD 9/20/20221:39 PM

## 2020-12-31 DIAGNOSIS — M79672 Pain in left foot: Secondary | ICD-10-CM | POA: Diagnosis not present

## 2020-12-31 DIAGNOSIS — M79671 Pain in right foot: Secondary | ICD-10-CM | POA: Diagnosis not present

## 2020-12-31 DIAGNOSIS — E1140 Type 2 diabetes mellitus with diabetic neuropathy, unspecified: Secondary | ICD-10-CM | POA: Diagnosis not present

## 2020-12-31 DIAGNOSIS — L110 Acquired keratosis follicularis: Secondary | ICD-10-CM | POA: Diagnosis not present

## 2020-12-31 DIAGNOSIS — I739 Peripheral vascular disease, unspecified: Secondary | ICD-10-CM | POA: Diagnosis not present

## 2021-01-13 ENCOUNTER — Telehealth: Payer: Self-pay | Admitting: Orthopaedic Surgery

## 2021-01-13 NOTE — Telephone Encounter (Signed)
Patient requests refill:  Pharmacy is [facility] on [address] [place] HYDROcodone-acetaminophen (NORCO/VICODIN) 5-325 MG tablet 30 tablet

## 2021-01-15 MED ORDER — HYDROCODONE-ACETAMINOPHEN 5-325 MG PO TABS: ORAL_TABLET | ORAL | 0 refills | Status: DC

## 2021-02-03 ENCOUNTER — Ambulatory Visit: Payer: Medicare Other | Admitting: Orthopaedic Surgery

## 2021-02-03 ENCOUNTER — Other Ambulatory Visit: Payer: Self-pay

## 2021-02-03 ENCOUNTER — Encounter: Payer: Self-pay | Admitting: Orthopaedic Surgery

## 2021-02-03 DIAGNOSIS — G8929 Other chronic pain: Secondary | ICD-10-CM | POA: Diagnosis not present

## 2021-02-03 DIAGNOSIS — M25561 Pain in right knee: Secondary | ICD-10-CM

## 2021-02-03 DIAGNOSIS — M25562 Pain in left knee: Secondary | ICD-10-CM

## 2021-02-03 NOTE — Progress Notes (Signed)
PROCEDURE NOTE:  The patient requests injections of the right knee , verbal consent was obtained.  The right knee was prepped appropriately after time out was performed.   Sterile technique was observed and injection of 1 cc of DepoMedrol 40mg  with several cc's of plain xylocaine. Anesthesia was provided by ethyl chloride and a 20-gauge needle was used to inject the knee area. The injection was tolerated well.  A band aid dressing was applied.  The patient was advised to apply ice later today and tomorrow to the injection sight as needed.   PROCEDURE NOTE:  The patient requests injections of the left knee , verbal consent was obtained.  The left knee was prepped appropriately after time out was performed.   Sterile technique was observed and injection of 1 cc of DepoMedrol 40 mg with several cc's of plain xylocaine. Anesthesia was provided by ethyl chloride and a 20-gauge needle was used to inject the knee area. The injection was tolerated well.  A band aid dressing was applied.  The patient was advised to apply ice later today and tomorrow to the injection sight as needed.   Encounter Diagnosis  Name Primary?   Chronic pain of both knees Yes   Return in two months.  Call if any problem.  Precautions discussed.  Electronically Signed Sanjuana Kava, MD 11/22/202210:54 AM

## 2021-02-04 ENCOUNTER — Telehealth: Payer: Self-pay | Admitting: Orthopaedic Surgery

## 2021-02-04 MED ORDER — HYDROCODONE-ACETAMINOPHEN 5-325 MG PO TABS: ORAL_TABLET | ORAL | 0 refills | Status: DC

## 2021-02-04 NOTE — Telephone Encounter (Signed)
Patient called and wants to know where her prescription is.  She went to the pharmacy and it is not there.    I asked her did she let him know she needed a refill when she was here yesterday and she said yes she did and the nurse said she was going to call it in for her!!  I advised her it will be next Tuesday before Dr. Luna Glasgow sees it message.  She will call back next Tuesday!   HYDROcodone-acetaminophen (NORCO/VICODIN) 5-325 MG tablet  Pharmacy:  [facility] on [address]

## 2021-02-16 DIAGNOSIS — Z79899 Other long term (current) drug therapy: Secondary | ICD-10-CM | POA: Diagnosis not present

## 2021-02-16 DIAGNOSIS — I10 Essential (primary) hypertension: Secondary | ICD-10-CM | POA: Diagnosis not present

## 2021-02-16 DIAGNOSIS — E1129 Type 2 diabetes mellitus with other diabetic kidney complication: Secondary | ICD-10-CM | POA: Diagnosis not present

## 2021-02-16 DIAGNOSIS — E875 Hyperkalemia: Secondary | ICD-10-CM | POA: Diagnosis not present

## 2021-02-16 DIAGNOSIS — N184 Chronic kidney disease, stage 4 (severe): Secondary | ICD-10-CM | POA: Diagnosis not present

## 2021-02-24 DIAGNOSIS — Z23 Encounter for immunization: Secondary | ICD-10-CM | POA: Diagnosis not present

## 2021-02-24 DIAGNOSIS — E1122 Type 2 diabetes mellitus with diabetic chronic kidney disease: Secondary | ICD-10-CM | POA: Diagnosis not present

## 2021-02-24 DIAGNOSIS — I10 Essential (primary) hypertension: Secondary | ICD-10-CM | POA: Diagnosis not present

## 2021-02-24 DIAGNOSIS — R7309 Other abnormal glucose: Secondary | ICD-10-CM | POA: Diagnosis not present

## 2021-02-24 DIAGNOSIS — N184 Chronic kidney disease, stage 4 (severe): Secondary | ICD-10-CM | POA: Diagnosis not present

## 2021-03-10 DIAGNOSIS — D0472 Carcinoma in situ of skin of left lower limb, including hip: Secondary | ICD-10-CM | POA: Diagnosis not present

## 2021-03-10 DIAGNOSIS — L820 Inflamed seborrheic keratosis: Secondary | ICD-10-CM | POA: Diagnosis not present

## 2021-03-10 DIAGNOSIS — L570 Actinic keratosis: Secondary | ICD-10-CM | POA: Diagnosis not present

## 2021-03-10 DIAGNOSIS — X32XXXD Exposure to sunlight, subsequent encounter: Secondary | ICD-10-CM | POA: Diagnosis not present

## 2021-03-10 DIAGNOSIS — L821 Other seborrheic keratosis: Secondary | ICD-10-CM | POA: Diagnosis not present

## 2021-03-18 DIAGNOSIS — I739 Peripheral vascular disease, unspecified: Secondary | ICD-10-CM | POA: Diagnosis not present

## 2021-03-18 DIAGNOSIS — L110 Acquired keratosis follicularis: Secondary | ICD-10-CM | POA: Diagnosis not present

## 2021-03-18 DIAGNOSIS — E1140 Type 2 diabetes mellitus with diabetic neuropathy, unspecified: Secondary | ICD-10-CM | POA: Diagnosis not present

## 2021-03-18 DIAGNOSIS — M79672 Pain in left foot: Secondary | ICD-10-CM | POA: Diagnosis not present

## 2021-03-18 DIAGNOSIS — M79671 Pain in right foot: Secondary | ICD-10-CM | POA: Diagnosis not present

## 2021-04-05 ENCOUNTER — Emergency Department (HOSPITAL_COMMUNITY): Payer: Medicare Other

## 2021-04-05 ENCOUNTER — Encounter (HOSPITAL_COMMUNITY): Payer: Self-pay

## 2021-04-05 ENCOUNTER — Observation Stay (HOSPITAL_COMMUNITY)
Admission: EM | Admit: 2021-04-05 | Discharge: 2021-04-06 | Disposition: A | Discharge status: Home / Self Care | Payer: Medicare Other | Attending: Internal Medicine | Admitting: Internal Medicine

## 2021-04-05 DIAGNOSIS — Z79899 Other long term (current) drug therapy: Secondary | ICD-10-CM | POA: Insufficient documentation

## 2021-04-05 DIAGNOSIS — R0600 Dyspnea, unspecified: Secondary | ICD-10-CM | POA: Diagnosis not present

## 2021-04-05 DIAGNOSIS — Z87891 Personal history of nicotine dependence: Secondary | ICD-10-CM | POA: Diagnosis not present

## 2021-04-05 DIAGNOSIS — I129 Hypertensive chronic kidney disease with stage 1 through stage 4 chronic kidney disease, or unspecified chronic kidney disease: Secondary | ICD-10-CM | POA: Diagnosis not present

## 2021-04-05 DIAGNOSIS — I2510 Atherosclerotic heart disease of native coronary artery without angina pectoris: Secondary | ICD-10-CM | POA: Diagnosis not present

## 2021-04-05 DIAGNOSIS — I10 Essential (primary) hypertension: Secondary | ICD-10-CM | POA: Diagnosis present

## 2021-04-05 DIAGNOSIS — J45909 Unspecified asthma, uncomplicated: Secondary | ICD-10-CM | POA: Insufficient documentation

## 2021-04-05 DIAGNOSIS — N179 Acute kidney failure, unspecified: Secondary | ICD-10-CM | POA: Diagnosis not present

## 2021-04-05 DIAGNOSIS — E1169 Type 2 diabetes mellitus with other specified complication: Secondary | ICD-10-CM | POA: Diagnosis not present

## 2021-04-05 DIAGNOSIS — E1122 Type 2 diabetes mellitus with diabetic chronic kidney disease: Secondary | ICD-10-CM | POA: Diagnosis not present

## 2021-04-05 DIAGNOSIS — R062 Wheezing: Secondary | ICD-10-CM

## 2021-04-05 DIAGNOSIS — Z20822 Contact with and (suspected) exposure to covid-19: Secondary | ICD-10-CM | POA: Diagnosis not present

## 2021-04-05 DIAGNOSIS — J9601 Acute respiratory failure with hypoxia: Principal | ICD-10-CM

## 2021-04-05 DIAGNOSIS — Z794 Long term (current) use of insulin: Secondary | ICD-10-CM | POA: Insufficient documentation

## 2021-04-05 DIAGNOSIS — J209 Acute bronchitis, unspecified: Secondary | ICD-10-CM | POA: Diagnosis present

## 2021-04-05 DIAGNOSIS — N183 Chronic kidney disease, stage 3 unspecified: Secondary | ICD-10-CM | POA: Diagnosis present

## 2021-04-05 DIAGNOSIS — E119 Type 2 diabetes mellitus without complications: Secondary | ICD-10-CM

## 2021-04-05 DIAGNOSIS — E860 Dehydration: Secondary | ICD-10-CM | POA: Diagnosis not present

## 2021-04-05 DIAGNOSIS — R6889 Other general symptoms and signs: Secondary | ICD-10-CM | POA: Diagnosis not present

## 2021-04-05 DIAGNOSIS — Z7982 Long term (current) use of aspirin: Secondary | ICD-10-CM | POA: Insufficient documentation

## 2021-04-05 DIAGNOSIS — R0602 Shortness of breath: Secondary | ICD-10-CM | POA: Diagnosis present

## 2021-04-05 DIAGNOSIS — R0689 Other abnormalities of breathing: Secondary | ICD-10-CM | POA: Diagnosis not present

## 2021-04-05 DIAGNOSIS — Z743 Need for continuous supervision: Secondary | ICD-10-CM | POA: Diagnosis not present

## 2021-04-05 LAB — COMPREHENSIVE METABOLIC PANEL
ALT: 22 U/L (ref 0–44)
AST: 27 U/L (ref 15–41)
Albumin: 3.8 g/dL (ref 3.5–5.0)
Alkaline Phosphatase: 81 U/L (ref 38–126)
Anion gap: 10 (ref 5–15)
BUN: 42 mg/dL — ABNORMAL HIGH (ref 8–23)
CO2: 28 mmol/L (ref 22–32)
Calcium: 8.8 mg/dL — ABNORMAL LOW (ref 8.9–10.3)
Chloride: 98 mmol/L (ref 98–111)
Creatinine, Ser: 2.03 mg/dL — ABNORMAL HIGH (ref 0.44–1.00)
GFR, Estimated: 24 mL/min — ABNORMAL LOW (ref >60)
Glucose, Bld: 78 mg/dL (ref 70–99)
Potassium: 4 mmol/L (ref 3.5–5.1)
Sodium: 136 mmol/L (ref 135–145)
Total Bilirubin: 0.3 mg/dL (ref 0.3–1.2)
Total Protein: 6.8 g/dL (ref 6.5–8.1)

## 2021-04-05 LAB — RESP PANEL BY RT-PCR (FLU A&B, COVID) ARPGX2
Influenza A by PCR: NEGATIVE
Influenza B by PCR: NEGATIVE
SARS Coronavirus 2 by RT PCR: NEGATIVE

## 2021-04-05 LAB — CBC WITH DIFFERENTIAL/PLATELET
Abs Immature Granulocytes: 0.04 10*3/uL (ref 0.00–0.07)
Basophils Absolute: 0 10*3/uL (ref 0.0–0.1)
Basophils Relative: 0 %
Eosinophils Absolute: 0.2 10*3/uL (ref 0.0–0.5)
Eosinophils Relative: 2 %
HCT: 37.1 % (ref 36.0–46.0)
Hemoglobin: 12 g/dL (ref 12.0–15.0)
Immature Granulocytes: 0 %
Lymphocytes Relative: 24 %
Lymphs Abs: 2.6 10*3/uL (ref 0.7–4.0)
MCH: 31.8 pg (ref 26.0–34.0)
MCHC: 32.3 g/dL (ref 30.0–36.0)
MCV: 98.4 fL (ref 80.0–100.0)
Monocytes Absolute: 1 10*3/uL (ref 0.1–1.0)
Monocytes Relative: 10 %
Neutro Abs: 6.8 10*3/uL (ref 1.7–7.7)
Neutrophils Relative %: 64 %
Platelets: 219 10*3/uL (ref 150–400)
RBC: 3.77 MIL/uL — ABNORMAL LOW (ref 3.87–5.11)
RDW: 13.3 % (ref 11.5–15.5)
WBC: 10.8 10*3/uL — ABNORMAL HIGH (ref 4.0–10.5)
nRBC: 0 % (ref 0.0–0.2)

## 2021-04-05 LAB — MAGNESIUM: Magnesium: 1.8 mg/dL (ref 1.7–2.4)

## 2021-04-05 LAB — CBG MONITORING, ED: Glucose-Capillary: 101 mg/dL — ABNORMAL HIGH (ref 70–99)

## 2021-04-05 LAB — GLUCOSE, CAPILLARY: Glucose-Capillary: 220 mg/dL — ABNORMAL HIGH (ref 70–99)

## 2021-04-05 LAB — BRAIN NATRIURETIC PEPTIDE: B Natriuretic Peptide: 13 pg/mL (ref 0.0–100.0)

## 2021-04-05 MED ORDER — PANTOPRAZOLE SODIUM 40 MG PO TBEC
40.0000 mg | DELAYED_RELEASE_TABLET | Freq: Every day | ORAL | Status: DC
  Administered 2021-04-05 – 2021-04-06 (×2): 40 mg via ORAL
  Filled 2021-04-05 (×2): qty 1

## 2021-04-05 MED ORDER — SODIUM CHLORIDE 0.9 % IV SOLN
1.0000 g | Freq: Once | INTRAVENOUS | Status: AC
  Administered 2021-04-05: 1 g via INTRAVENOUS
  Filled 2021-04-05: qty 10

## 2021-04-05 MED ORDER — ALBUTEROL SULFATE (2.5 MG/3ML) 0.083% IN NEBU
INHALATION_SOLUTION | RESPIRATORY_TRACT | Status: AC
  Administered 2021-04-05: 2.5 mg
  Filled 2021-04-05: qty 3

## 2021-04-05 MED ORDER — ENOXAPARIN SODIUM 40 MG/0.4ML IJ SOSY
40.0000 mg | PREFILLED_SYRINGE | INTRAMUSCULAR | Status: DC
  Administered 2021-04-06: 40 mg via SUBCUTANEOUS
  Filled 2021-04-05: qty 0.4

## 2021-04-05 MED ORDER — ALPRAZOLAM 1 MG PO TABS
1.0000 mg | ORAL_TABLET | Freq: Every day | ORAL | Status: DC
  Administered 2021-04-05: 1 mg via ORAL
  Filled 2021-04-05: qty 1

## 2021-04-05 MED ORDER — INSULIN GLARGINE-YFGN 100 UNIT/ML ~~LOC~~ SOLN
25.0000 [IU] | Freq: Two times a day (BID) | SUBCUTANEOUS | Status: DC
  Administered 2021-04-06: 25 [IU] via SUBCUTANEOUS
  Filled 2021-04-05 (×3): qty 0.25

## 2021-04-05 MED ORDER — INSULIN ASPART 100 UNIT/ML IJ SOLN
0.0000 [IU] | Freq: Three times a day (TID) | INTRAMUSCULAR | Status: DC
  Administered 2021-04-06 (×2): 3 [IU] via SUBCUTANEOUS

## 2021-04-05 MED ORDER — POLYETHYLENE GLYCOL 3350 17 G PO PACK: 17.0000 g | PACK | Freq: Every day | ORAL | Status: DC | PRN

## 2021-04-05 MED ORDER — SODIUM CHLORIDE 0.9 % IV SOLN: INTRAVENOUS | Status: AC

## 2021-04-05 MED ORDER — ALBUTEROL SULFATE (2.5 MG/3ML) 0.083% IN NEBU
INHALATION_SOLUTION | RESPIRATORY_TRACT | Status: AC
  Administered 2021-04-05: 10 mg
  Filled 2021-04-05: qty 12

## 2021-04-05 MED ORDER — PREDNISONE 20 MG PO TABS
40.0000 mg | ORAL_TABLET | Freq: Every day | ORAL | Status: DC
  Filled 2021-04-05: qty 2

## 2021-04-05 MED ORDER — SODIUM CHLORIDE 0.9 % IV SOLN
500.0000 mg | Freq: Once | INTRAVENOUS | Status: AC
  Administered 2021-04-05: 500 mg via INTRAVENOUS
  Filled 2021-04-05: qty 5

## 2021-04-05 MED ORDER — ACETAMINOPHEN 650 MG RE SUPP: 650.0000 mg | Freq: Four times a day (QID) | RECTAL | Status: DC | PRN

## 2021-04-05 MED ORDER — CARVEDILOL 12.5 MG PO TABS
12.5000 mg | ORAL_TABLET | Freq: Two times a day (BID) | ORAL | Status: DC
  Administered 2021-04-05 – 2021-04-06 (×2): 12.5 mg via ORAL
  Filled 2021-04-05 (×2): qty 1

## 2021-04-05 MED ORDER — ACETAMINOPHEN 325 MG PO TABS: 650.0000 mg | ORAL_TABLET | Freq: Four times a day (QID) | ORAL | Status: DC | PRN

## 2021-04-05 MED ORDER — METHYLPREDNISOLONE SODIUM SUCC 125 MG IJ SOLR
125.0000 mg | Freq: Once | INTRAMUSCULAR | Status: AC
  Administered 2021-04-05: 125 mg via INTRAVENOUS
  Filled 2021-04-05: qty 2

## 2021-04-05 MED ORDER — METHYLPREDNISOLONE SODIUM SUCC 125 MG IJ SOLR
60.0000 mg | Freq: Two times a day (BID) | INTRAMUSCULAR | Status: DC
  Administered 2021-04-06: 60 mg via INTRAVENOUS
  Filled 2021-04-05: qty 2

## 2021-04-05 MED ORDER — BASAGLAR KWIKPEN 100 UNIT/ML ~~LOC~~ SOPN: 25.0000 [IU] | PEN_INJECTOR | Freq: Two times a day (BID) | SUBCUTANEOUS | Status: DC

## 2021-04-05 MED ORDER — ALBUTEROL (5 MG/ML) CONTINUOUS INHALATION SOLN
10.0000 mg/h | INHALATION_SOLUTION | Freq: Once | RESPIRATORY_TRACT | Status: DC
  Filled 2021-04-05: qty 20

## 2021-04-05 MED ORDER — IPRATROPIUM-ALBUTEROL 0.5-2.5 (3) MG/3ML IN SOLN
3.0000 mL | Freq: Four times a day (QID) | RESPIRATORY_TRACT | Status: DC
  Administered 2021-04-05 – 2021-04-06 (×3): 3 mL via RESPIRATORY_TRACT
  Filled 2021-04-05 (×3): qty 3

## 2021-04-05 MED ORDER — LACTATED RINGERS IV BOLUS
500.0000 mL | Freq: Once | INTRAVENOUS | Status: AC
  Administered 2021-04-05: 500 mL via INTRAVENOUS

## 2021-04-05 MED ORDER — INSULIN ASPART 100 UNIT/ML IJ SOLN
0.0000 [IU] | Freq: Every day | INTRAMUSCULAR | Status: DC
  Administered 2021-04-05: 2 [IU] via SUBCUTANEOUS

## 2021-04-05 NOTE — H&P (Addendum)
History and Physical    LINDEN MIKES LKT:[id number] DOB: 1935-02-02 DOA: 04/05/2021  PCP: Asencion Noble, MD   Patient coming from: Home  I have personally briefly reviewed patient's old medical records in [place]  Chief Complaint: Difficulty breathing.  HPI: Marie House is a 87 y.o. female with medical history significant for CKD 3, hypertension and diabetes mellitus. Patient presented to the ED with complaints of difficulty breathing after 2 to 3 days, wheezing of 1 week, and cough productive of clear sputum about 2 to 3 weeks.  She denies chest pain.  No leg swelling. She reports similar presentations in the past with diagnosis of acute bronchitis.  She reports she smoked ~about 2 packs/day of cigarettes for about 2 years but quit several years ago.  No fevers no chills.  She denies prior diagnosis of COPD. Patient was diagnosed with acute bronchitis by her PCP and was prescribed doxycycline, 1/17, she has been taking it 2ce daily since then, but has a few pills to go.   ED Course: Temperature 97.9.  O2 sats 91 - 100% on room air, intermittently dropping to 88% at rest.  Creatinine elevated 2.  WBC 10.8.  BNP 13.  Magnesium 1.8.  Chest x-ray shows chronic interstitial coarsening. IV Ceftriaxone and azithromycin, 500 mils LR given, Solu-Medrol 125 mg given.  Review of Systems: As per HPI all other systems reviewed and negative.  Past Medical History:  Diagnosis Date   Arthritis    Carotid artery disease ([place])    CKD (chronic kidney disease) stage 3, GFR 30-59 ml/min (HCC)    Coronary atherosclerosis of native coronary artery    Cypher DES LAD 2003, Taxus DES LAD 2004   Essential hypertension, benign    Mixed hyperlipidemia    Type 2 diabetes mellitus ([place])     Past Surgical History:  Procedure Laterality Date   CAROTID ENDARTERECTOMY Right 2004   CATARACT EXTRACTION     CHOLECYSTECTOMY     Left breast mastectomy     25 yrs ago   MASTECTOMY Left    PARTIAL  HYSTERECTOMY     TONSILLECTOMY       reports that she quit smoking about 15 years ago. Her smoking use included cigarettes. She has never used smokeless tobacco. She reports that she does not drink alcohol and does not use drugs.  Allergies  Allergen Reactions   Altace [Ramipril]    Atacand [Candesartan]    Avandia [Rosiglitazone]    Crestor [Rosuvastatin]    Diovan [Valsartan]    Lisinopril    Lotensin [Benazepril]    Norvasc [Amlodipine Besylate]    Zetia [Ezetimibe]     Family History  Problem Relation Age of Onset   CAD Mother     Prior to Admission medications   Medication Sig Start Date End Date Taking? Authorizing Provider  ALPRAZolam Duanne Moron) 0.5 MG tablet Take 0.5 mg by mouth 3 (three) times daily as needed for anxiety.    [provider]  aspirin EC 81 MG tablet Take 81 mg by mouth daily.    [provider]  carvedilol (COREG) 12.5 MG tablet Take 12.5 mg by mouth 2 (two) times daily with a meal.     [provider]  HUMALOG KWIKPEN 100 UNIT/ML KwikPen Inject 25 Units into the skin 2 (two) times a day.  07/26/18   [provider]  hydrochlorothiazide (HYDRODIURIL) 25 MG tablet Take 25 mg by mouth daily. 03/23/20   [provider]  HYDROcodone-acetaminophen (NORCO/VICODIN) 5-325 MG tablet One tablet every six hours for pain.  Limit 7 days. 02/04/21   Sanjuana Kava, MD  Insulin Glargine (BASAGLAR KWIKPEN) 100 UNIT/ML SOPN Inject 55 Units into the skin 2 (two) times daily.    [provider]  Multiple Vitamins-Minerals (CENTRUM SILVER PO) Take 1 tablet by mouth daily with breakfast.    [provider]  nitroGLYCERIN (NITROSTAT) 0.4 MG SL tablet ONE TABLET UNDER TONGUE AS NEEDED FOR CHEST PAIN EVERY 5 MINUTES CONTACT MD AS DIRECTED 12/03/16   Satira Sark, MD  omeprazole (PRILOSEC) 20 MG capsule Take 20 mg by mouth daily.    [provider]  Pediatric Multivit-Minerals-C (FLINTSTONES GUMMIES) chewable  tablet Chew 1 tablet by mouth daily. 04/16/19   Rogene Houston, MD  pravastatin (PRAVACHOL) 80 MG tablet Take 80 mg by mouth daily.    [provider]  PROAIR HFA 108 (90 Base) MCG/ACT inhaler INL 2 PFS PO Q 4 H PRN 09/29/18   [provider]  traZODone (DESYREL) 50 MG tablet Take 50 mg by mouth at bedtime.    [provider]    Physical Exam: Vitals:   04/05/21 1419 04/05/21 1430 04/05/21 1506 04/05/21 1515  BP: 121/72 (!) 154/51  121/74  Pulse: 80 77  76  Resp: 18 16  18   Temp: 97.9 F (36.6 C)     TempSrc: Oral     SpO2: 92% 91% 94% 100%  Weight:      Height:        Constitutional: NAD, calm, comfortable Vitals:   04/05/21 1419 04/05/21 1430 04/05/21 1506 04/05/21 1515  BP: 121/72 (!) 154/51  121/74  Pulse: 80 77  76  Resp: 18 16  18   Temp: 97.9 F (36.6 C)     TempSrc: Oral     SpO2: 92% 91% 94% 100%  Weight:      Height:       Eyes: PERRL, lids and conjunctivae normal ENMT: Mucous membranes are moist.  Neck: normal, supple, no masses, no thyromegaly Respiratory: Diffuse inspiratory and expiratory wheezing, with mild increased work of breathing,   Cardiovascular: Regular rate and rhythm, no murmurs / rubs / gallops. No extremity edema. 2+ pedal pulses.   Abdomen: no tenderness, no masses palpated. No hepatosplenomegaly. Bowel sounds positive.  Musculoskeletal: no clubbing / cyanosis. No joint deformity upper and lower extremities. Good ROM, no contractures. Normal muscle tone.  Skin: no rashes, lesions, ulcers. No induration Neurologic: No apparent cranial abnormality, moving extremities spontaneously. Psychiatric: Normal judgment and insight. Alert and oriented x 3. Normal mood.   Labs on Admission: I have personally reviewed following labs and imaging studies  CBC: Recent Labs  Lab 04/05/21 1455  WBC 10.8*  NEUTROABS 6.8  HGB 12.0  HCT 37.1  MCV 98.4  PLT 580   Basic Metabolic Panel: Recent Labs  Lab 04/05/21 1455  NA 136   K 4.0  CL 98  CO2 28  GLUCOSE 78  BUN 42*  CREATININE 2.03*  CALCIUM 8.8*  MG 1.8   GFR: Estimated Creatinine Clearance: 23.8 mL/min (A) (by C-G formula based on SCr of 2.03 mg/dL (H)). Liver Function Tests: Recent Labs  Lab 04/05/21 1455  AST 27  ALT 22  ALKPHOS 81  BILITOT 0.3  PROT 6.8  ALBUMIN 3.8    Radiological Exams on Admission: DG Chest Port 1 View  Result Date: 04/05/2021 CLINICAL DATA:  Dyspnea. EXAM: PORTABLE CHEST 1 VIEW COMPARISON:  10/05/2019 FINDINGS: 9983  hours. The lungs are clear without focal pneumonia, edema, pneumothorax or pleural effusion. Interstitial markings are diffusely coarsened with chronic features. The cardio pericardial silhouette is enlarged. Bones are diffusely demineralized. Telemetry leads overlie the chest. IMPRESSION: Chronic interstitial coarsening without acute cardiopulmonary findings. Electronically Signed   By: Misty Stanley M.D.   On: 04/05/2021 15:11    EKG: Independently reviewed.  Sinus rhythm, rate 78.  QTc 485.  No significant change from prior.  Assessment/Plan Principal Problem:   Acute bronchitis Active Problems:   AKI (acute kidney injury) ([place])   CKD (chronic kidney disease) stage 3, GFR 30-59 ml/min (HCC)   Type 2 diabetes mellitus (HCC)   Essential hypertension, benign  Acute bronchitis-productive cough, dyspnea, wheezing, chest x-ray -chronic interstitial coarsening without acute abnormality.  Likely has underlying chronic lung disease.  History of cigarette smoking. Temperature 97.9.  WBC 10.8.  BMP unremarkable at 13.  -DuoNebs as needed and scheduled -Mucolytic's as needed -IV Solu-Medrol >> prednisone -Prescribed doxycycline as outpatient 1/17, has been compliant, hold further antibiotics for now.  Likely acute on chronic kidney disease- Cr 2, baseline about 1.6 last check 2 years ago. -Hold HCTZ - 538mls LR bolus given, continue N/s 75cc/hr x 15hrs  Diabetes mellitus- - SSI- M - HgbA1c -Resume home  Lantus at reduced dose 25 units twice daily  (home dose 55 BID).  Hypertension -Resume diltiazem, carvedilol, pending med reconciliation. - Holding HCTZ  DVT prophylaxis: Lovenox Code Status: DNR, confirmed with patient at bedside.  States she has a living will. Family Communication: Son Octavia Bruckner at bedside. Disposition Plan: ~ 1 - 2 days Consults called: None Admission status:  Obs tele    Bethena Roys MD [facility]  04/05/2021, 7:12 PM

## 2021-04-05 NOTE — ED Notes (Signed)
RN attempted to titrate Pt off Nasal Cannula to room air. Pts O2 Sats dropped to 88%. Pt vocal to this RN and her significant other verbalize a desire to want to leave and go home at this time, due to the length of time it is taking for them to get a room upstairs. This RN assured them hospital staff are working on getting a bed ready upstairs, but the risks of leaving AMA would not be beneficial for Pts prognosis as the Pt has a new requirement for Supplemental Oxygen. Pt had 2L O2 via Nasal Cannula placed back on and Pts O2 Sats improved to 92%-93%. Pt has non-productive cough noted during initial assessment as well.

## 2021-04-05 NOTE — ED Provider Notes (Addendum)
[facility] Provider Note   CSN: [id number] Arrival date & time: 04/05/21  1346     History  No chief complaint on file.   Marie House is a 87 y.o. female.  HPI Patient presents for shortness of breath.  She had a recent diagnosis of bronchitis and was prescribed doxycycline.  She was also given an albuterol inhaler.  She does not have any known history of asthma or COPD.  Today, she had worsening shortness of breath and EMS was called.  EMS noted wheezing on lung auscultation and SPO2 in the high 80s on room air.  She was placed on supplemental oxygen and given 2.5 mg of albuterol prior to arrival.  Additional medical history is notable for CAD, T2DM, HLD, HTN, GERD.  Although patient has no diagnosis of chronic lung disease, she does have a smoking history.  She has had a cough that is productive of clear sputum.    Home Medications Prior to Admission medications   Medication Sig Start Date End Date Taking? Authorizing Provider  ALPRAZolam Duanne Moron) 0.5 MG tablet Take 0.5 mg by mouth 3 (three) times daily as needed for anxiety.   Yes [provider]  aspirin EC 81 MG tablet Take 81 mg by mouth daily.   Yes [provider]  benzonatate (TESSALON) 200 MG capsule Take 200 mg by mouth 3 (three) times daily as needed for cough. 03/31/21  Yes [provider]  carvedilol (COREG) 12.5 MG tablet Take 12.5 mg by mouth 2 (two) times daily with a meal.    Yes [provider]  doxycycline (VIBRAMYCIN) 100 MG capsule Take 100 mg by mouth 2 (two) times daily. 03/31/21  Yes [provider]  HUMALOG KWIKPEN 100 UNIT/ML KwikPen Inject 25 Units into the skin 2 (two) times a day.  07/26/18  Yes [provider]  hydrochlorothiazide (HYDRODIURIL) 25 MG tablet Take 25 mg by mouth daily. 03/23/20  Yes [provider]  HYDROcodone-acetaminophen (NORCO/VICODIN) 5-325 MG tablet One tablet every six hours for pain.  Limit 7  days. Patient taking differently: Take 1 tablet by mouth every 6 (six) hours as needed for moderate pain. 02/04/21  Yes Sanjuana Kava, MD  Insulin Glargine (BASAGLAR KWIKPEN) 100 UNIT/ML SOPN Inject 55 Units into the skin 2 (two) times daily.   Yes [provider]  Multiple Vitamins-Minerals (CENTRUM SILVER PO) Take 1 tablet by mouth daily with breakfast.   Yes [provider]  omeprazole (PRILOSEC) 20 MG capsule Take 20 mg by mouth daily.   Yes [provider]  pravastatin (PRAVACHOL) 80 MG tablet Take 80 mg by mouth daily.   Yes [provider]  PROAIR HFA 108 (90 Base) MCG/ACT inhaler Inhale 1-2 puffs into the lungs every 4 (four) hours as needed for wheezing or shortness of breath. 09/29/18  Yes [provider]  DILT-XR 120 MG 24 hr capsule Take 120 mg by mouth daily. 01/08/21   [provider]  nitroGLYCERIN (NITROSTAT) 0.4 MG SL tablet ONE TABLET UNDER TONGUE AS NEEDED FOR CHEST PAIN EVERY 5 MINUTES CONTACT MD AS DIRECTED Patient taking differently: Place 0.4 mg under the tongue every 5 (five) minutes as needed. 12/03/16   Satira Sark, MD  Pediatric Multivit-Minerals-C (FLINTSTONES GUMMIES) chewable tablet Chew 1 tablet by mouth daily. Patient not taking: Reported on 04/06/2021 04/16/19   Rogene Houston, MD  traZODone (DESYREL) 50 MG tablet Take 50 mg by mouth at bedtime.    [provider]  Allergies    Altace [ramipril], Atacand [candesartan], Avandia [rosiglitazone], Crestor [rosuvastatin], Diovan [valsartan], Lisinopril, Lotensin [benazepril], Norvasc [amlodipine besylate], and Zetia [ezetimibe]    Review of Systems   Review of Systems  Constitutional:  Positive for appetite change, chills and fatigue. Negative for fever.  HENT:  Positive for congestion. Negative for sore throat.   Respiratory:  Positive for cough, chest tightness, shortness of breath and wheezing.   Cardiovascular:  Negative for chest pain,  palpitations and leg swelling.  Gastrointestinal:  Negative for diarrhea and vomiting.  Genitourinary:  Negative for flank pain and pelvic pain.  Musculoskeletal:  Negative for arthralgias, gait problem, joint swelling and neck pain.  Skin:  Negative for pallor and wound.  Neurological:  Negative for dizziness, syncope, facial asymmetry, speech difficulty, weakness, light-headedness, numbness and headaches.  Hematological:  Does not bruise/bleed easily.  Psychiatric/Behavioral:  Negative for confusion and decreased concentration.    Physical Exam Updated Vital Signs BP (!) 143/57 (BP Location: Right Arm)    Pulse 78    Temp 97.8 F (36.6 C) (Oral)    Resp 20    Ht 5\' 5"  (1.651 m)    Wt 96.5 kg    SpO2 98%    BMI 35.41 kg/m  Physical Exam Vitals and nursing note reviewed.  Constitutional:      General: She is not in acute distress.    Appearance: She is well-developed and normal weight. She is ill-appearing. She is not toxic-appearing.  HENT:     Head: Normocephalic and atraumatic.     Right Ear: External ear normal.     Left Ear: External ear normal.     Nose: Congestion present.     Mouth/Throat:     Mouth: Mucous membranes are moist.     Pharynx: Oropharynx is clear.  Eyes:     General: No scleral icterus.    Extraocular Movements: Extraocular movements intact.     Conjunctiva/sclera: Conjunctivae normal.  Cardiovascular:     Rate and Rhythm: Normal rate and regular rhythm.     Heart sounds: No murmur heard. Pulmonary:     Effort: Tachypnea and accessory muscle usage present.     Breath sounds: Wheezing present.  Abdominal:     Palpations: Abdomen is soft.     Tenderness: There is no abdominal tenderness.  Musculoskeletal:        General: No swelling.     Cervical back: Normal range of motion and neck supple. No rigidity.     Right lower leg: No edema.     Left lower leg: No edema.  Skin:    General: Skin is warm and dry.     Capillary Refill: Capillary refill takes  less than 2 seconds.     Coloration: Skin is not jaundiced or pale.  Neurological:     General: No focal deficit present.     Mental Status: She is alert and oriented to person, place, and time.     Cranial Nerves: No cranial nerve deficit.     Sensory: No sensory deficit.     Motor: No weakness.  Psychiatric:        Mood and Affect: Mood normal.        Behavior: Behavior normal.    ED Results / Procedures / Treatments   Labs (all labs ordered are listed, but only abnormal results are displayed) Labs Reviewed  COMPREHENSIVE METABOLIC PANEL - Abnormal; Notable for the following components:      Result Value  BUN 42 (*)    Creatinine, Ser 2.03 (*)    Calcium 8.8 (*)    GFR, Estimated 24 (*)    All other components within normal limits  CBC WITH DIFFERENTIAL/PLATELET - Abnormal; Notable for the following components:   WBC 10.8 (*)    RBC 3.77 (*)    All other components within normal limits  BASIC METABOLIC PANEL - Abnormal; Notable for the following components:   Glucose, Bld 264 (*)    BUN 38 (*)    Creatinine, Ser 1.74 (*)    Calcium 8.7 (*)    GFR, Estimated 29 (*)    All other components within normal limits  CBC - Abnormal; Notable for the following components:   RBC 3.72 (*)    Hemoglobin 11.7 (*)    All other components within normal limits  GLUCOSE, CAPILLARY - Abnormal; Notable for the following components:   Glucose-Capillary 220 (*)    All other components within normal limits  GLUCOSE, CAPILLARY - Abnormal; Notable for the following components:   Glucose-Capillary 230 (*)    All other components within normal limits  CBG MONITORING, ED - Abnormal; Notable for the following components:   Glucose-Capillary 101 (*)    All other components within normal limits  RESP PANEL BY RT-PCR (FLU A&B, COVID) ARPGX2  BRAIN NATRIURETIC PEPTIDE  MAGNESIUM  HEMOGLOBIN A1C    EKG EKG Interpretation  Date/Time:  Sunday April 05 2021 14:17:54 EST Ventricular Rate:   78 PR Interval:  53 QRS Duration: 75 QT Interval:  425 QTC Calculation: 485 R Axis:   58 Text Interpretation: Sinus rhythm Short PR interval Abnormal R-wave progression, early transition Baseline wander in lead(s) V2 Confirmed by Godfrey Pick [id number]) on 04/05/2021 2:47:09 PM  Radiology DG Chest Port 1 View  Result Date: 04/05/2021 CLINICAL DATA:  Dyspnea. EXAM: PORTABLE CHEST 1 VIEW COMPARISON:  10/05/2019 FINDINGS: 1454 hours. The lungs are clear without focal pneumonia, edema, pneumothorax or pleural effusion. Interstitial markings are diffusely coarsened with chronic features. The cardio pericardial silhouette is enlarged. Bones are diffusely demineralized. Telemetry leads overlie the chest. IMPRESSION: Chronic interstitial coarsening without acute cardiopulmonary findings. Electronically Signed   By: Misty Stanley M.D.   On: 04/05/2021 15:11    Procedures Procedures    Medications Ordered in ED Medications  ALPRAZolam Duanne Moron) tablet 1 mg (1 mg Oral Given 04/05/21 2146)  pantoprazole (PROTONIX) EC tablet 40 mg (40 mg Oral Given 04/06/21 0910)  insulin aspart (novoLOG) injection 0-9 Units (3 Units Subcutaneous Given 04/06/21 0911)  insulin aspart (novoLOG) injection 0-5 Units (2 Units Subcutaneous Given 04/05/21 2146)  ipratropium-albuterol (DUONEB) 0.5-2.5 (3) MG/3ML nebulizer solution 3 mL (3 mLs Nebulization Given 04/06/21 0715)  methylPREDNISolone sodium succinate (SOLU-MEDROL) 125 mg/2 mL injection 60 mg (60 mg Intravenous Given 04/06/21 0541)    Followed by  predniSONE (DELTASONE) tablet 40 mg (has no administration in time range)  enoxaparin (LOVENOX) injection 40 mg (40 mg Subcutaneous Given 04/06/21 0911)  0.9 %  sodium chloride infusion ( Intravenous New Bag/Given 04/05/21 2155)  acetaminophen (TYLENOL) tablet 650 mg (has no administration in time range)    Or  acetaminophen (TYLENOL) suppository 650 mg (has no administration in time range)  polyethylene glycol (MIRALAX / GLYCOLAX)  packet 17 g (has no administration in time range)  carvedilol (COREG) tablet 12.5 mg (12.5 mg Oral Given 04/06/21 0910)  insulin glargine-yfgn (SEMGLEE) injection 25 Units (has no administration in time range)  methylPREDNISolone sodium succinate (SOLU-MEDROL) 125 mg/2 mL injection  125 mg (125 mg Intravenous Given 04/05/21 1529)  albuterol (PROVENTIL) (2.5 MG/3ML) 0.083% nebulizer solution (10 mg  Given 04/05/21 1506)  cefTRIAXone (ROCEPHIN) 1 g in sodium chloride 0.9 % 100 mL IVPB (0 g Intravenous Stopped 04/05/21 1555)  azithromycin (ZITHROMAX) 500 mg in sodium chloride 0.9 % 250 mL IVPB (0 mg Intravenous Stopped 04/05/21 1734)  lactated ringers bolus 500 mL (0 mLs Intravenous Stopped 04/05/21 1716)  albuterol (PROVENTIL) (2.5 MG/3ML) 0.083% nebulizer solution (2.5 mg  Given 04/05/21 2119)    ED Course/ Medical Decision Making/ A&P                           Medical Decision Making Amount and/or Complexity of Data Reviewed Labs: ordered. Radiology: ordered.  Risk Prescription drug management. Decision regarding hospitalization.   This patient presents to the ED for concern of shortness of breath, this involves an extensive number of treatment options, and is a complaint that carries with it a high risk of complications and morbidity.  The differential diagnosis includes exacerbation of undiagnosed chronic lung disease; pneumonia, PE, CAD, CHF, viral illness   Co morbidities that complicate the patient evaluation  CAD, T2DM, HLD, HTN, GERD, CKD.   Additional history obtained:  Additional history obtained from patient's son External records from outside source obtained and reviewed including EMR   Lab Tests:  I Ordered, and personally interpreted labs.  The pertinent results include: Mild leukocytosis, normal hemoglobin, normal electrolytes, mild elevation in creatinine, increased BUN, normal BNP   Imaging Studies ordered:  I ordered imaging studies including chest x-ray I  independently visualized and interpreted imaging which showed no acute findings, evidence of chronic interstitial thickening I agree with the radiologist interpretation   Cardiac Monitoring:  The patient was maintained on a cardiac monitor.  I personally viewed and interpreted the cardiac monitored which showed an underlying rhythm of: Sinus rhythm   Medicines ordered and prescription drug management:  I ordered medication including Solu-Medrol, nebulized albuterol, and antibiotics for clinical pneumonia/exacerbation of undiagnosed pulmonary disease, in addition to IVF for dehydration Reevaluation of the patient after these medicines showed that the patient improved I have reviewed the patients home medicines and have made adjustments as needed  Critical Interventions:  Solu-Medrol, nebulized albuterol, and antibiotics for wheezing, hypoxia, and shortness of breath, in addition to IVF for dehydration  Problem List / ED Course:  87 year old female presenting for approximately 1 week of worsening shortness of breath, cough productive of clear sputum, and wheezing.  She was previously diagnosed with bronchitis and prescribed albuterol inhaler and doxycycline.  She was not started on oral steroids.  On arrival in the ED, patient has increased work of breathing, and diffuse wheezing throughout lung fields.  On room air, SPO2 was 88%.  She was started on supplemental oxygen.  She was also given Solu-Medrol and nebulized albuterol.  Although she has no known chronic lung disease, she does have interstitial thickening on x-ray and prominent wheezing.  I suspect she does have chronic lung disease that has not previously been diagnosed.  She does have a smoking history.  Following breathing treatment, patient continues to have increased work of breathing.  Due to this and her new oxygen requirement, I do feel that she would benefit from admission to the hospital for continued  treatment.   Reevaluation:  After the interventions noted above, I reevaluated the patient and found that they have :improved   Social Determinants of Health:  Patient does have a primary care doctor   Dispostion:  After consideration of the diagnostic results and the patients response to treatment, I feel that the patent would benefit from admission to hospital.    CRITICAL CARE Performed by: Godfrey Pick   Total critical care time: 35 minutes  Critical care time was exclusive of separately billable procedures and treating other patients.  Critical care was necessary to treat or prevent imminent or life-threatening deterioration.  Critical care was time spent personally by me on the following activities: development of treatment plan with patient and/or surrogate as well as nursing, discussions with consultants, evaluation of patient's response to treatment, examination of patient, obtaining history from patient or surrogate, ordering and performing treatments and interventions, ordering and review of laboratory studies, ordering and review of radiographic studies, pulse oximetry and re-evaluation of patient's condition.       Final Clinical Impression(s) / ED Diagnoses Final diagnoses:  Wheezing  Dyspnea, unspecified type  Acute respiratory failure with hypoxia [facility])    Rx / DC Orders ED Discharge Orders     None         Godfrey Pick, MD 04/06/21 0768    Godfrey Pick, MD 04/06/21 [id number]

## 2021-04-05 NOTE — ED Triage Notes (Signed)
Pt from home diagnosed with bronchitis on 1/17, wheezing noted pt given albuterol tx 2.5.  Pt alert and oriented EMS R 22 RW

## 2021-04-06 ENCOUNTER — Other Ambulatory Visit: Payer: Self-pay

## 2021-04-06 DIAGNOSIS — J9601 Acute respiratory failure with hypoxia: Secondary | ICD-10-CM | POA: Diagnosis not present

## 2021-04-06 DIAGNOSIS — E1122 Type 2 diabetes mellitus with diabetic chronic kidney disease: Secondary | ICD-10-CM

## 2021-04-06 DIAGNOSIS — N179 Acute kidney failure, unspecified: Secondary | ICD-10-CM | POA: Diagnosis not present

## 2021-04-06 DIAGNOSIS — J209 Acute bronchitis, unspecified: Secondary | ICD-10-CM | POA: Diagnosis not present

## 2021-04-06 DIAGNOSIS — Z794 Long term (current) use of insulin: Secondary | ICD-10-CM | POA: Diagnosis not present

## 2021-04-06 DIAGNOSIS — N1832 Chronic kidney disease, stage 3b: Secondary | ICD-10-CM | POA: Diagnosis not present

## 2021-04-06 DIAGNOSIS — R062 Wheezing: Secondary | ICD-10-CM

## 2021-04-06 DIAGNOSIS — I10 Essential (primary) hypertension: Secondary | ICD-10-CM | POA: Diagnosis not present

## 2021-04-06 LAB — CBC
HCT: 36.5 % (ref 36.0–46.0)
Hemoglobin: 11.7 g/dL — ABNORMAL LOW (ref 12.0–15.0)
MCH: 31.5 pg (ref 26.0–34.0)
MCHC: 32.1 g/dL (ref 30.0–36.0)
MCV: 98.1 fL (ref 80.0–100.0)
Platelets: 225 10*3/uL (ref 150–400)
RBC: 3.72 MIL/uL — ABNORMAL LOW (ref 3.87–5.11)
RDW: 13.2 % (ref 11.5–15.5)
WBC: 6.8 10*3/uL (ref 4.0–10.5)
nRBC: 0 % (ref 0.0–0.2)

## 2021-04-06 LAB — GLUCOSE, CAPILLARY
Glucose-Capillary: 230 mg/dL — ABNORMAL HIGH (ref 70–99)
Glucose-Capillary: 226 mg/dL — ABNORMAL HIGH (ref 70–99)

## 2021-04-06 LAB — BASIC METABOLIC PANEL
Anion gap: 11 (ref 5–15)
BUN: 38 mg/dL — ABNORMAL HIGH (ref 8–23)
CO2: 26 mmol/L (ref 22–32)
Calcium: 8.7 mg/dL — ABNORMAL LOW (ref 8.9–10.3)
Chloride: 98 mmol/L (ref 98–111)
Creatinine, Ser: 1.74 mg/dL — ABNORMAL HIGH (ref 0.44–1.00)
GFR, Estimated: 29 mL/min — ABNORMAL LOW (ref >60)
Glucose, Bld: 264 mg/dL — ABNORMAL HIGH (ref 70–99)
Potassium: 4 mmol/L (ref 3.5–5.1)
Sodium: 135 mmol/L (ref 135–145)

## 2021-04-06 MED ORDER — DM-GUAIFENESIN ER 30-600 MG PO TB12: 1.0000 | ORAL_TABLET | Freq: Two times a day (BID) | ORAL | 0 refills | Status: DC

## 2021-04-06 MED ORDER — IPRATROPIUM-ALBUTEROL 20-100 MCG/ACT IN AERS: 1.0000 | INHALATION_SPRAY | Freq: Four times a day (QID) | RESPIRATORY_TRACT | 1 refills | Status: DC | PRN

## 2021-04-06 MED ORDER — OMEPRAZOLE 20 MG PO CPDR: 20.0000 mg | DELAYED_RELEASE_CAPSULE | Freq: Two times a day (BID) | ORAL | 1 refills | Status: DC

## 2021-04-06 MED ORDER — PREDNISONE 20 MG PO TABS: ORAL_TABLET | ORAL | 0 refills | Status: DC

## 2021-04-06 MED ORDER — ENOXAPARIN SODIUM 30 MG/0.3ML IJ SOSY: 30.0000 mg | PREFILLED_SYRINGE | INTRAMUSCULAR | Status: DC

## 2021-04-06 NOTE — Discharge Summary (Signed)
Physician Discharge Summary  Marie House RJJ:[id number] DOB: December 11, 1934 DOA: 04/05/2021  PCP: Asencion Noble, MD  Admit date: 04/05/2021 Discharge date: 04/06/2021  Time spent: 35 minutes  Recommendations for Outpatient Follow-up:  Repeat basic metabolic panel to follow close renal function Aggressive blood pressure and adjust antihypertensive regimen as needed Assure complete resolution of reactive airway disease and bronchitis symptoms with treatment. Continue assisting patient with weight loss management.  Discharge Diagnoses:  Principal Problem:   Acute bronchitis Active Problems:   CKD (chronic kidney disease) stage 3, GFR 30-59 ml/min (HCC)   Type 2 diabetes mellitus with nephropathy (HCC)   Essential hypertension, benign   AKI (acute kidney injury) ([place])   Acute respiratory failure with hypoxia (HCC)   Wheezing Gastroesophageal reflux disease Hyperlipidemia Depression/anxiety Class II obesity   Discharge Condition: Stable and improved.  Discharged home with instruction to follow-up with PCP in 10 days.  CODE STATUS: Full code.  Diet recommendation: Heart healthy modified carbohydrate diet.  Filed Weights   04/05/21 1409 04/05/21 2047  Weight: 97 kg 96.5 kg    History of present illness:  As per H&P written by Dr. Denton Brick on 04/05/2021 Marie House is a 87 y.o. female with medical history significant for CKD 3, hypertension and diabetes mellitus. Patient presented to the ED with complaints of difficulty breathing after 2 to 3 days, wheezing of 1 week, and cough productive of clear sputum about 2 to 3 weeks.  She denies chest pain.  No leg swelling. She reports similar presentations in the past with diagnosis of acute bronchitis.  She reports she smoked ~about 2 packs/day of cigarettes for about 2 years but quit several years ago.  No fevers no chills.  She denies prior diagnosis of COPD. Patient was diagnosed with acute bronchitis by her PCP and was prescribed  doxycycline, 1/17, she has been taking it 2ce daily since then, but has a few pills to go.    ED Course: Temperature 97.9.  O2 sats 91 - 100% on room air, intermittently dropping to 88% at rest.  Creatinine elevated 2.  WBC 10.8.  BNP 13.  Magnesium 1.8.  Chest x-ray shows chronic interstitial coarsening. IV Ceftriaxone and azithromycin, 500 mils LR given, Solu-Medrol 125 mg given.  Hospital Course:  1-acute respiratory failure with hypoxia in the setting of bronchitis and reactive airway disease. -Patient finished outpatient antibiotic therapy prior to admission -No frank infiltrate seen on x-ray it; positive bronchitis changes and the scarring appreciated. -Excellent response to the use of bronchodilators and steroids. -Patient having discharged home with steroids tapering, the use of Combivent inhaler and as needed mucolytic/antitussive medications. -Advised to maintain adequate hydration and to follow-up with PCP in 10 days.  2-acute kidney injury and chronic kidney disease -Patient with a baseline creatinine of 1.6-1.7 -Presented with a creatinine that peaked at 2.06 -Appears to be associated to prerenal azotemia and continue use of nephrotoxic agents -HCTZ was held overnight and patient received fluid resuscitation; at discharge renal function back to baseline. -Safe to resume home medications.  3-type 2 diabetes with nephropathy -Advised to follow modified carbohydrate diet -Resume home hypoglycemic regimen -Continue outpatient follow-up to further adjust blood sugar medications as needed.  4-hypertension -Stable and improved; resume home antihypertensive regimen. -Advised to follow heart healthy/low-sodium diet.  5-class II obesity -Body mass index is 35.41 kg/m.- -Low calorie Diet and portion control discussed with patient.  6-gastroesophageal for disease -Continue the use of PPI; dose adjusted to twice a day for palpitation  while receiving steroids  management.  7-hyperlipidemia -Continue statins.  8-depression/anxiety -Resume home as needed Xanax and continue the use of trazodone. -No suicidal ideation or hallucinations.  Procedures: See below for x-ray reports.  Consultations: None  Discharge Exam: Vitals:   04/06/21 0446 04/06/21 0715  BP: (!) 143/57   Pulse: 78   Resp: 20   Temp: 97.8 F (36.6 C)   SpO2: 97% 98%    General: Afebrile, no chest pain, no nausea, no vomiting.  Reports improvement in her breathing and feeling ready to go home.  No requiring oxygen supplementation currently. Cardiovascular: Rate controlled, no rubs, no gallops, no JVD. Respiratory: Positive scattered rhonchi, mild expiratory wheezing, no using accessory muscles, normal respiratory effort.  Good saturation on room air. Abdomen: Obese, soft, nontender, nondistended, positive bowel sounds. Extremities: No cyanosis or clubbing.  Discharge Instructions   Discharge Instructions     Diet - low sodium heart healthy   Complete by: As directed    Discharge instructions   Complete by: As directed    Take medications as prescribed Maintain adequate hydration Arrange follow-up with PCP in 10 days. Continue to follow heart healthy and modify carbohydrate diet.   Increase activity slowly   Complete by: As directed       Allergies as of 04/06/2021       Reactions   Altace [ramipril]    Atacand [candesartan]    Avandia [rosiglitazone]    Crestor [rosuvastatin]    Diovan [valsartan]    Lisinopril    Lotensin [benazepril]    Norvasc [amlodipine Besylate]    Zetia [ezetimibe]         Medication List     STOP taking these medications    doxycycline 100 MG capsule Commonly known as: VIBRAMYCIN   Flintstones Gummies chewable tablet   HYDROcodone-acetaminophen 5-325 MG tablet Commonly known as: NORCO/VICODIN   ProAir HFA 108 (90 Base) MCG/ACT inhaler Generic drug: albuterol       TAKE these medications    ALPRAZolam  0.5 MG tablet Commonly known as: XANAX Take 0.5 mg by mouth 3 (three) times daily as needed for anxiety.   aspirin EC 81 MG tablet Take 81 mg by mouth daily.   Basaglar KwikPen 100 UNIT/ML Inject 55 Units into the skin 2 (two) times daily.   benzonatate 200 MG capsule Commonly known as: TESSALON Take 200 mg by mouth 3 (three) times daily as needed for cough.   carvedilol 12.5 MG tablet Commonly known as: COREG Take 12.5 mg by mouth 2 (two) times daily with a meal.   CENTRUM SILVER PO Take 1 tablet by mouth daily with breakfast.   dextromethorphan-guaiFENesin 30-600 MG 12hr tablet Commonly known as: MUCINEX DM Take 1 tablet by mouth 2 (two) times daily.   Dilt-XR 120 MG 24 hr capsule Generic drug: diltiazem Take 120 mg by mouth daily.   HumaLOG KwikPen 100 UNIT/ML KwikPen Generic drug: insulin lispro Inject 25 Units into the skin 2 (two) times a day.   hydrochlorothiazide 25 MG tablet Commonly known as: HYDRODIURIL Take 25 mg by mouth daily.   Ipratropium-Albuterol 20-100 MCG/ACT Aers respimat Commonly known as: COMBIVENT Inhale 1 puff into the lungs every 6 (six) hours as needed for wheezing or shortness of breath.   nitroGLYCERIN 0.4 MG SL tablet Commonly known as: NITROSTAT ONE TABLET UNDER TONGUE AS NEEDED FOR CHEST PAIN EVERY 5 MINUTES CONTACT MD AS DIRECTED What changed: See the new instructions.   omeprazole 20 MG capsule Commonly known as:  PRILOSEC Take 1 capsule (20 mg total) by mouth 2 (two) times daily before a meal. What changed: when to take this   pravastatin 80 MG tablet Commonly known as: PRAVACHOL Take 80 mg by mouth daily.   predniSONE 20 MG tablet Commonly known as: DELTASONE Take 3 tablets by mouth daily x1 day; then 2 tablets by mouth daily x2 days; then 1 tablet by mouth daily x3 days; then half tablet by mouth daily x3 days and stop prednisone.   traZODone 50 MG tablet Commonly known as: DESYREL Take 50 mg by mouth at bedtime.        Allergies  Allergen Reactions   Altace [Ramipril]    Atacand [Candesartan]    Avandia [Rosiglitazone]    Crestor [Rosuvastatin]    Diovan [Valsartan]    Lisinopril    Lotensin [Benazepril]    Norvasc [Amlodipine Besylate]    Zetia [Ezetimibe]     Follow-up Information     Asencion Noble, MD. Schedule an appointment as soon as possible for a visit in 10 day(s).   Specialty: Internal Medicine Contact information: [address] [place] [address] [phone]         Satira Sark, MD .   Specialty: Cardiology Contact information: [place] [address] [phone]                 The results of significant diagnostics from this hospitalization (including imaging, microbiology, ancillary and laboratory) are listed below for reference.    Significant Diagnostic Studies: DG Chest Port 1 View  Result Date: 04/05/2021 CLINICAL DATA:  Dyspnea. EXAM: PORTABLE CHEST 1 VIEW COMPARISON:  10/05/2019 FINDINGS: 1454 hours. The lungs are clear without focal pneumonia, edema, pneumothorax or pleural effusion. Interstitial markings are diffusely coarsened with chronic features. The cardio pericardial silhouette is enlarged. Bones are diffusely demineralized. Telemetry leads overlie the chest. IMPRESSION: Chronic interstitial coarsening without acute cardiopulmonary findings. Electronically Signed   By: Misty Stanley M.D.   On: 04/05/2021 15:11    Microbiology: Recent Results (from the past 240 hour(s))  Resp Panel by RT-PCR (Flu A&B, Covid) Nasopharyngeal Swab     Status: None   Collection Time: 04/05/21  2:49 PM   Specimen: Nasopharyngeal Swab; Nasopharyngeal(NP) swabs in vial transport medium  Result Value Ref Range Status   SARS Coronavirus 2 by RT PCR NEGATIVE NEGATIVE Final    Comment: (NOTE) SARS-CoV-2 target nucleic acids are NOT DETECTED.  The SARS-CoV-2 RNA is generally detectable in upper respiratory specimens during the acute phase  of infection. The lowest concentration of SARS-CoV-2 viral copies this assay can detect is 138 copies/mL. A negative result does not preclude SARS-Cov-2 infection and should not be used as the sole basis for treatment or other patient management decisions. A negative result may occur with  improper specimen collection/handling, submission of specimen other than nasopharyngeal swab, presence of viral mutation(s) within the areas targeted by this assay, and inadequate number of viral copies(<138 copies/mL). A negative result must be combined with clinical observations, patient history, and epidemiological information. The expected result is Negative.  Fact Sheet for Patients:  EntrepreneurPulse.com.au  Fact Sheet for Healthcare Providers:  IncredibleEmployment.be  This test is no t yet approved or cleared by the [place] FDA and  has been authorized for detection and/or diagnosis of SARS-CoV-2 by FDA under an Emergency Use Authorization (EUA). This EUA will remain  in effect (meaning this test can be used) for the duration of the COVID-19 declaration under  Section 564(b)(1) of the Act, 21 U.S.C.section 360bbb-3(b)(1), unless the authorization is terminated  or revoked sooner.       Influenza A by PCR NEGATIVE NEGATIVE Final   Influenza B by PCR NEGATIVE NEGATIVE Final    Comment: (NOTE) The Xpert Xpress SARS-CoV-2/FLU/RSV plus assay is intended as an aid in the diagnosis of influenza from Nasopharyngeal swab specimens and should not be used as a sole basis for treatment. Nasal washings and aspirates are unacceptable for Xpert Xpress SARS-CoV-2/FLU/RSV testing.  Fact Sheet for Patients: EntrepreneurPulse.com.au  Fact Sheet for Healthcare Providers: IncredibleEmployment.be  This test is not yet approved or cleared by the [place] FDA and has been authorized for detection and/or diagnosis of  SARS-CoV-2 by FDA under an Emergency Use Authorization (EUA). This EUA will remain in effect (meaning this test can be used) for the duration of the COVID-19 declaration under Section 564(b)(1) of the Act, 21 U.S.C. section 360bbb-3(b)(1), unless the authorization is terminated or revoked.  Performed at [facility], [address] [place], [place] [address]      Labs: Basic Metabolic Panel: Recent Labs  Lab 04/05/21 1455 04/06/21 0417  NA 136 135  K 4.0 4.0  CL 98 98  CO2 28 26  GLUCOSE 78 264*  BUN 42* 38*  CREATININE 2.03* 1.74*  CALCIUM 8.8* 8.7*  MG 1.8  --    Liver Function Tests: Recent Labs  Lab 04/05/21 1455  AST 27  ALT 22  ALKPHOS 81  BILITOT 0.3  PROT 6.8  ALBUMIN 3.8   CBC: Recent Labs  Lab 04/05/21 1455 04/06/21 0417  WBC 10.8* 6.8  NEUTROABS 6.8  --   HGB 12.0 11.7*  HCT 37.1 36.5  MCV 98.4 98.1  PLT 219 225   BNP (last 3 results) Recent Labs    04/05/21 1455  BNP 13.0   CBG: Recent Labs  Lab 04/05/21 1916 04/05/21 2139 04/06/21 0730 04/06/21 1202  GLUCAP 101* 220* 230* 226*   Signed:  Barton Dubois MD.  [facility] 04/06/2021, 12:04 PM

## 2021-04-06 NOTE — TOC Progression Note (Signed)
°  Transition of Care [facility]) Screening Note   Patient Details  Name: ALLAYAH RAINERI Date of Birth: 29-Mar-1934   Transition of Care [facility]) CM/SW Contact:    Shade Flood, LCSW Phone Number: 04/06/2021, 12:45 PM    Transition of Care Department [facility]) has reviewed patient and no TOC needs have been identified at this time. We will continue to monitor patient advancement through interdisciplinary progression rounds. If new patient transition needs arise, please place a TOC consult.

## 2021-04-06 NOTE — Progress Notes (Signed)
SATURATION QUALIFICATIONS: (This note is used to comply with regulatory documentation for home oxygen)  Patient Saturations on Room Air at Rest = 94%  Patient Saturations on Room Air while Ambulating = 92%  Patient Saturations on 0 Liters of oxygen while Ambulating = 92%  Please briefly explain why patient needs home oxygen: Pt does not require oxygen therapy at this time. MD made aware of results.

## 2021-04-06 NOTE — Progress Notes (Signed)
Pt discharged to home. DC instructions given by [employer], Agricultural consultant. See nurse's note for details. Pt left unit in wheelchair pushed by [employer], RN.  VWilliams,RN.

## 2021-04-07 ENCOUNTER — Ambulatory Visit: Payer: Medicare Other | Admitting: Orthopaedic Surgery

## 2021-04-07 LAB — HEMOGLOBIN A1C
Hgb A1c MFr Bld: 6.3 % — ABNORMAL HIGH (ref 4.8–5.6)
Mean Plasma Glucose: 134 mg/dL

## 2021-04-13 DIAGNOSIS — R7309 Other abnormal glucose: Secondary | ICD-10-CM | POA: Diagnosis not present

## 2021-04-13 DIAGNOSIS — I10 Essential (primary) hypertension: Secondary | ICD-10-CM | POA: Diagnosis not present

## 2021-04-13 DIAGNOSIS — N184 Chronic kidney disease, stage 4 (severe): Secondary | ICD-10-CM | POA: Diagnosis not present

## 2021-04-28 DIAGNOSIS — R051 Acute cough: Secondary | ICD-10-CM | POA: Diagnosis not present

## 2021-04-28 DIAGNOSIS — I10 Essential (primary) hypertension: Secondary | ICD-10-CM | POA: Diagnosis not present

## 2021-04-30 ENCOUNTER — Other Ambulatory Visit: Payer: Self-pay

## 2021-04-30 ENCOUNTER — Encounter: Payer: Self-pay | Admitting: Orthopaedic Surgery

## 2021-04-30 ENCOUNTER — Ambulatory Visit: Payer: Medicare Other | Admitting: Orthopaedic Surgery

## 2021-04-30 DIAGNOSIS — M25561 Pain in right knee: Secondary | ICD-10-CM

## 2021-04-30 DIAGNOSIS — G8929 Other chronic pain: Secondary | ICD-10-CM

## 2021-04-30 DIAGNOSIS — M25562 Pain in left knee: Secondary | ICD-10-CM

## 2021-04-30 MED ORDER — HYDROCODONE-ACETAMINOPHEN 5-325 MG PO TABS: ORAL_TABLET | ORAL | 0 refills | Status: DC

## 2021-04-30 NOTE — Progress Notes (Signed)
PROCEDURE NOTE:  The patient requests injections of the left knee , verbal consent was obtained.  The left knee was prepped appropriately after time out was performed.   Sterile technique was observed and injection of 1 cc of DepoMedrol 40 mg with several cc's of plain xylocaine. Anesthesia was provided by ethyl chloride and a 20-gauge needle was used to inject the knee area. The injection was tolerated well.  A band aid dressing was applied.  The patient was advised to apply ice later today and tomorrow to the injection sight as needed.  PROCEDURE NOTE:  The patient requests injections of the right knee , verbal consent was obtained.  The right knee was prepped appropriately after time out was performed.   Sterile technique was observed and injection of 1 cc of DepoMedrol 40mg  with several cc's of plain xylocaine. Anesthesia was provided by ethyl chloride and a 20-gauge needle was used to inject the knee area. The injection was tolerated well.  A band aid dressing was applied.  The patient was advised to apply ice later today and tomorrow to the injection sight as needed.  Encounter Diagnosis  Name Primary?   Chronic pain of both knees Yes   I have reviewed the [place] web site prior to prescribing narcotic medicine for this patient.  Return in two months.  Call if any problem.  Precautions discussed.  Electronically Signed Sanjuana Kava, MD 2/16/20239:49 AM

## 2021-05-21 ENCOUNTER — Encounter (HOSPITAL_COMMUNITY): Payer: Self-pay

## 2021-05-21 ENCOUNTER — Other Ambulatory Visit: Payer: Self-pay

## 2021-05-21 ENCOUNTER — Emergency Department (HOSPITAL_COMMUNITY): Payer: Medicare Other

## 2021-05-21 ENCOUNTER — Inpatient Hospital Stay (HOSPITAL_COMMUNITY)
Admission: EM | Admit: 2021-05-21 | Discharge: 2021-05-24 | DRG: 189 | Disposition: A | Discharge status: Home / Self Care | Payer: Medicare Other | Attending: Family Medicine | Admitting: Family Medicine

## 2021-05-21 DIAGNOSIS — J9690 Respiratory failure, unspecified, unspecified whether with hypoxia or hypercapnia: Secondary | ICD-10-CM | POA: Diagnosis not present

## 2021-05-21 DIAGNOSIS — Z7982 Long term (current) use of aspirin: Secondary | ICD-10-CM

## 2021-05-21 DIAGNOSIS — R0789 Other chest pain: Secondary | ICD-10-CM | POA: Diagnosis not present

## 2021-05-21 DIAGNOSIS — D72829 Elevated white blood cell count, unspecified: Secondary | ICD-10-CM

## 2021-05-21 DIAGNOSIS — I10 Essential (primary) hypertension: Secondary | ICD-10-CM | POA: Diagnosis present

## 2021-05-21 DIAGNOSIS — R069 Unspecified abnormalities of breathing: Secondary | ICD-10-CM | POA: Diagnosis not present

## 2021-05-21 DIAGNOSIS — J189 Pneumonia, unspecified organism: Secondary | ICD-10-CM | POA: Diagnosis not present

## 2021-05-21 DIAGNOSIS — J9601 Acute respiratory failure with hypoxia: Principal | ICD-10-CM | POA: Diagnosis present

## 2021-05-21 DIAGNOSIS — I129 Hypertensive chronic kidney disease with stage 1 through stage 4 chronic kidney disease, or unspecified chronic kidney disease: Secondary | ICD-10-CM | POA: Diagnosis not present

## 2021-05-21 DIAGNOSIS — I700 Atherosclerosis of aorta: Secondary | ICD-10-CM | POA: Diagnosis not present

## 2021-05-21 DIAGNOSIS — E1122 Type 2 diabetes mellitus with diabetic chronic kidney disease: Secondary | ICD-10-CM | POA: Diagnosis present

## 2021-05-21 DIAGNOSIS — R9389 Abnormal findings on diagnostic imaging of other specified body structures: Secondary | ICD-10-CM | POA: Diagnosis present

## 2021-05-21 DIAGNOSIS — Z79899 Other long term (current) drug therapy: Secondary | ICD-10-CM | POA: Diagnosis not present

## 2021-05-21 DIAGNOSIS — D72828 Other elevated white blood cell count: Secondary | ICD-10-CM | POA: Diagnosis present

## 2021-05-21 DIAGNOSIS — Z794 Long term (current) use of insulin: Secondary | ICD-10-CM | POA: Diagnosis not present

## 2021-05-21 DIAGNOSIS — I499 Cardiac arrhythmia, unspecified: Secondary | ICD-10-CM | POA: Diagnosis not present

## 2021-05-21 DIAGNOSIS — E871 Hypo-osmolality and hyponatremia: Secondary | ICD-10-CM | POA: Diagnosis not present

## 2021-05-21 DIAGNOSIS — R0602 Shortness of breath: Secondary | ICD-10-CM

## 2021-05-21 DIAGNOSIS — Z743 Need for continuous supervision: Secondary | ICD-10-CM | POA: Diagnosis not present

## 2021-05-21 DIAGNOSIS — E669 Obesity, unspecified: Secondary | ICD-10-CM | POA: Diagnosis present

## 2021-05-21 DIAGNOSIS — E782 Mixed hyperlipidemia: Secondary | ICD-10-CM | POA: Diagnosis not present

## 2021-05-21 DIAGNOSIS — N184 Chronic kidney disease, stage 4 (severe): Secondary | ICD-10-CM | POA: Diagnosis not present

## 2021-05-21 DIAGNOSIS — E11649 Type 2 diabetes mellitus with hypoglycemia without coma: Secondary | ICD-10-CM | POA: Diagnosis not present

## 2021-05-21 DIAGNOSIS — Z6835 Body mass index (BMI) 35.0-35.9, adult: Secondary | ICD-10-CM

## 2021-05-21 DIAGNOSIS — Z90711 Acquired absence of uterus with remaining cervical stump: Secondary | ICD-10-CM

## 2021-05-21 DIAGNOSIS — Z8249 Family history of ischemic heart disease and other diseases of the circulatory system: Secondary | ICD-10-CM

## 2021-05-21 DIAGNOSIS — Z9012 Acquired absence of left breast and nipple: Secondary | ICD-10-CM

## 2021-05-21 DIAGNOSIS — I2510 Atherosclerotic heart disease of native coronary artery without angina pectoris: Secondary | ICD-10-CM | POA: Diagnosis present

## 2021-05-21 DIAGNOSIS — J9621 Acute and chronic respiratory failure with hypoxia: Secondary | ICD-10-CM | POA: Diagnosis not present

## 2021-05-21 DIAGNOSIS — Z87891 Personal history of nicotine dependence: Secondary | ICD-10-CM

## 2021-05-21 DIAGNOSIS — E119 Type 2 diabetes mellitus without complications: Secondary | ICD-10-CM

## 2021-05-21 DIAGNOSIS — R0902 Hypoxemia: Secondary | ICD-10-CM

## 2021-05-21 DIAGNOSIS — R079 Chest pain, unspecified: Secondary | ICD-10-CM | POA: Diagnosis not present

## 2021-05-21 LAB — CBC
HCT: 34.9 % — ABNORMAL LOW (ref 36.0–46.0)
Hemoglobin: 11.3 g/dL — ABNORMAL LOW (ref 12.0–15.0)
MCH: 31.6 pg (ref 26.0–34.0)
MCHC: 32.4 g/dL (ref 30.0–36.0)
MCV: 97.5 fL (ref 80.0–100.0)
Platelets: 202 10*3/uL (ref 150–400)
RBC: 3.58 MIL/uL — ABNORMAL LOW (ref 3.87–5.11)
RDW: 13.2 % (ref 11.5–15.5)
WBC: 12.4 10*3/uL — ABNORMAL HIGH (ref 4.0–10.5)
nRBC: 0 % (ref 0.0–0.2)

## 2021-05-21 LAB — COMPREHENSIVE METABOLIC PANEL
ALT: 21 U/L (ref 0–44)
AST: 24 U/L (ref 15–41)
Albumin: 3.6 g/dL (ref 3.5–5.0)
Alkaline Phosphatase: 80 U/L (ref 38–126)
Anion gap: 9 (ref 5–15)
BUN: 32 mg/dL — ABNORMAL HIGH (ref 8–23)
CO2: 31 mmol/L (ref 22–32)
Calcium: 8.7 mg/dL — ABNORMAL LOW (ref 8.9–10.3)
Chloride: 90 mmol/L — ABNORMAL LOW (ref 98–111)
Creatinine, Ser: 1.9 mg/dL — ABNORMAL HIGH (ref 0.44–1.00)
GFR, Estimated: 26 mL/min — ABNORMAL LOW (ref >60)
Glucose, Bld: 84 mg/dL (ref 70–99)
Potassium: 3.9 mmol/L (ref 3.5–5.1)
Sodium: 130 mmol/L — ABNORMAL LOW (ref 135–145)
Total Bilirubin: 0.2 mg/dL — ABNORMAL LOW (ref 0.3–1.2)
Total Protein: 6.7 g/dL (ref 6.5–8.1)

## 2021-05-21 LAB — TROPONIN I (HIGH SENSITIVITY): Troponin I (High Sensitivity): 7 ng/L (ref <18)

## 2021-05-21 NOTE — ED Provider Notes (Signed)
[facility] Emergency Department Provider Note MRN:  [id number]  Arrival date & time: 05/22/21     Chief Complaint   Shortness of Breath   History of Present Illness   Marie House is a 87 y.o. year-old female with a history of CKD, diabetes presenting to the ED with chief complaint of shortness of breath.  Shortness of breath for the past 1 or 2 days associated with increased cough.  Denies fever, no chest pain, no leg pain or swelling.  Review of Systems  A thorough review of systems was obtained and all systems are negative except as noted in the HPI and PMH.   Patient's Health History    Past Medical History:  Diagnosis Date   Arthritis    Carotid artery disease ([place])    CKD (chronic kidney disease) stage 3, GFR 30-59 ml/min (HCC)    Coronary atherosclerosis of native coronary artery    Cypher DES LAD 2003, Taxus DES LAD 2004   Essential hypertension, benign    Mixed hyperlipidemia    Type 2 diabetes mellitus ([place])     Past Surgical History:  Procedure Laterality Date   CAROTID ENDARTERECTOMY Right 2004   CATARACT EXTRACTION     CHOLECYSTECTOMY     Left breast mastectomy     25 yrs ago   MASTECTOMY Left    PARTIAL HYSTERECTOMY     TONSILLECTOMY      Family History  Problem Relation Age of Onset   CAD Mother     Social History   Socioeconomic History   Marital status: Single    Spouse name: Not on file   Number of children: Not on file   Years of education: Not on file   Highest education level: Not on file  Occupational History   Not on file  Tobacco Use   Smoking status: Former    Types: Cigarettes    Quit date: 03/15/2006    Years since quitting: 15.1   Smokeless tobacco: Never  Vaping Use   Vaping Use: Not on file  Substance and Sexual Activity   Alcohol use: No    Alcohol/week: 0.0 standard drinks   Drug use: No   Sexual activity: Not on file  Other Topics Concern   Not on file  Social History Narrative   Not on  file   Social Determinants of Health   Financial Resource Strain: Not on file  Food Insecurity: Not on file  Transportation Needs: Not on file  Physical Activity: Not on file  Stress: Not on file  Social Connections: Not on file  Intimate Partner Violence: Not on file     Physical Exam   Vitals:   05/22/21 0430 05/22/21 0541  BP: (!) 128/57 (!) 165/64  Pulse: 67 66  Resp: 16 18  Temp:  97.6 F (36.4 C)  SpO2: 100% 100%    CONSTITUTIONAL: Chronically ill-appearing, NAD NEURO/PSYCH:  Alert and oriented x 3, no focal deficits EYES:  eyes equal and reactive ENT/NECK:  no LAD, no JVD CARDIO: Regular rate, well-perfused, normal S1 and S2 PULM:  CTAB no wheezing or rhonchi GI/GU:  non-distended, non-tender MSK/SPINE:  No gross deformities, no edema SKIN:  no rash, atraumatic   *Additional and/or pertinent findings included in MDM below  Diagnostic and Interventional Summary    EKG Interpretation  Date/Time:  Thursday May 21 2021 22:54:33 EST Ventricular Rate:  77 PR Interval:  216 QRS Duration: 83 QT Interval:  411 QTC Calculation: 466  R Axis:   43 Text Interpretation: Sinus rhythm Borderline prolonged PR interval Confirmed by Gerlene Fee [phone]) on 05/21/2021 11:30:57 PM       Labs Reviewed  CBC - Abnormal; Notable for the following components:      Result Value   WBC 12.4 (*)    RBC 3.58 (*)    Hemoglobin 11.3 (*)    HCT 34.9 (*)    All other components within normal limits  COMPREHENSIVE METABOLIC PANEL - Abnormal; Notable for the following components:   Sodium 130 (*)    Chloride 90 (*)    BUN 32 (*)    Creatinine, Ser 1.90 (*)    Calcium 8.7 (*)    Total Bilirubin 0.2 (*)    GFR, Estimated 26 (*)    All other components within normal limits  COMPREHENSIVE METABOLIC PANEL - Abnormal; Notable for the following components:   Sodium 130 (*)    Chloride 93 (*)    Glucose, Bld 50 (*)    BUN 32 (*)    Creatinine, Ser 1.85 (*)    Calcium 8.4 (*)     Albumin 3.4 (*)    GFR, Estimated 27 (*)    All other components within normal limits  CBC - Abnormal; Notable for the following components:   WBC 12.0 (*)    RBC 3.59 (*)    Hemoglobin 11.1 (*)    HCT 34.9 (*)    All other components within normal limits  PHOSPHORUS - Abnormal; Notable for the following components:   Phosphorus 4.8 (*)    All other components within normal limits  GLUCOSE, CAPILLARY - Abnormal; Notable for the following components:   Glucose-Capillary 62 (*)    All other components within normal limits  RESP PANEL BY RT-PCR (FLU A&B, COVID) ARPGX2  CULTURE, BLOOD (ROUTINE X 2)  CULTURE, BLOOD (ROUTINE X 2)  EXPECTORATED SPUTUM ASSESSMENT W GRAM STAIN, RFLX TO RESP C  APTT  MAGNESIUM  PROCALCITONIN  OSMOLALITY  SODIUM, URINE, RANDOM  BASIC METABOLIC PANEL  LEGIONELLA PNEUMOPHILA SEROGP 1 UR AG  STREP PNEUMONIAE URINARY ANTIGEN  OSMOLALITY, URINE  TROPONIN I (HIGH SENSITIVITY)  TROPONIN I (HIGH SENSITIVITY)    DG Chest Portable 1 View  Final Result      Medications  enoxaparin (LOVENOX) injection 30 mg (30 mg Subcutaneous Given 05/22/21 0601)  insulin aspart (novoLOG) injection 0-9 Units (has no administration in time range)  insulin aspart (novoLOG) injection 0-5 Units (has no administration in time range)  0.9 %  sodium chloride infusion ( Intravenous New Bag/Given 05/22/21 0600)  cefTRIAXone (ROCEPHIN) 1 g in sodium chloride 0.9 % 100 mL IVPB (has no administration in time range)  azithromycin (ZITHROMAX) 500 mg in sodium chloride 0.9 % 250 mL IVPB (has no administration in time range)  acetaminophen (TYLENOL) tablet 650 mg (has no administration in time range)  guaiFENesin-dextromethorphan (ROBITUSSIN DM) 100-10 MG/5ML syrup 5 mL (has no administration in time range)  dextromethorphan-guaiFENesin (MUCINEX DM) 30-600 MG per 12 hr tablet 1 tablet (has no administration in time range)  carvedilol (COREG) tablet 12.5 mg (has no administration in time range)   pravastatin (PRAVACHOL) tablet 80 mg (has no administration in time range)  nitroGLYCERIN (NITROSTAT) SL tablet 0.4 mg (has no administration in time range)  cefTRIAXone (ROCEPHIN) 2 g in sodium chloride 0.9 % 100 mL IVPB (0 g Intravenous Stopped 05/22/21 0205)  azithromycin (ZITHROMAX) 500 mg in sodium chloride 0.9 % 250 mL IVPB (0 mg Intravenous Stopped 05/22/21  0309)     Procedures  /  Critical Care Procedures  ED Course and Medical Decision Making  Initial Impression and Ddx Increased cough and shortness of breath over the past 1 to 2 days.  Had a recent admission for bronchitis/pneumonia back in January.  This feels similar.  No significant respiratory distress, vital signs reassuring.  Does have oxygen saturations in the low 90s at rest.  Labs are reassuring, x-ray showing possible pneumonia.  Awaiting ambulatory assessment with pulse ox to determine disposition.  Past medical/surgical history that increases complexity of ED encounter:    Interpretation of Diagnostics I personally reviewed the Chest Xray and my interpretation is as follows: No pneumothorax, question opacities at the bases      Patient Reassessment and Ultimate Disposition/Management Patient with a lot of shortness of breath with brief ambulation, desaturation down to 88%.  Will admit to medicine for treatment of CAP.  Patient management required discussion with the following services or consulting groups:  Hospitalist Service  Complexity of Problems Addressed Acute illness or injury that poses threat of life of bodily function  Additional Data Reviewed and Analyzed Further history obtained from: Further history from spouse/family member  Additional Factors Impacting ED Encounter Risk Consideration of hospitalization  Barth Kirks. [place] Small, [place] [email or web address]  Final Clinical Impressions(s) / ED Diagnoses     ICD-10-CM   1. Community acquired  pneumonia, unspecified laterality  J18.9       ED Discharge Orders     None        Discharge Instructions Discussed with and Provided to Patient:   Discharge Instructions   None      Maudie Flakes, MD 05/22/21 [phone]

## 2021-05-21 NOTE — ED Triage Notes (Addendum)
Pt brought in by [facility] for shortness of breath. EMS reports pt was 91% on room air, denies chest pain, hx of bronchitis with admission the end of January. Pt breathing is not labored at this time. Pt reports sob started yesterday. Denies cough. Pt is 91-95% on room air in triage.  ?

## 2021-05-22 DIAGNOSIS — N184 Chronic kidney disease, stage 4 (severe): Secondary | ICD-10-CM

## 2021-05-22 DIAGNOSIS — E871 Hypo-osmolality and hyponatremia: Secondary | ICD-10-CM | POA: Diagnosis not present

## 2021-05-22 DIAGNOSIS — I10 Essential (primary) hypertension: Secondary | ICD-10-CM | POA: Diagnosis not present

## 2021-05-22 DIAGNOSIS — E1122 Type 2 diabetes mellitus with diabetic chronic kidney disease: Secondary | ICD-10-CM | POA: Diagnosis present

## 2021-05-22 DIAGNOSIS — D72829 Elevated white blood cell count, unspecified: Secondary | ICD-10-CM

## 2021-05-22 DIAGNOSIS — J189 Pneumonia, unspecified organism: Secondary | ICD-10-CM

## 2021-05-22 DIAGNOSIS — Z6835 Body mass index (BMI) 35.0-35.9, adult: Secondary | ICD-10-CM | POA: Diagnosis not present

## 2021-05-22 DIAGNOSIS — Z90711 Acquired absence of uterus with remaining cervical stump: Secondary | ICD-10-CM | POA: Diagnosis not present

## 2021-05-22 DIAGNOSIS — Z79899 Other long term (current) drug therapy: Secondary | ICD-10-CM | POA: Diagnosis not present

## 2021-05-22 DIAGNOSIS — E669 Obesity, unspecified: Secondary | ICD-10-CM | POA: Diagnosis not present

## 2021-05-22 DIAGNOSIS — R0602 Shortness of breath: Secondary | ICD-10-CM | POA: Diagnosis present

## 2021-05-22 DIAGNOSIS — E11649 Type 2 diabetes mellitus with hypoglycemia without coma: Secondary | ICD-10-CM | POA: Diagnosis present

## 2021-05-22 DIAGNOSIS — I129 Hypertensive chronic kidney disease with stage 1 through stage 4 chronic kidney disease, or unspecified chronic kidney disease: Secondary | ICD-10-CM | POA: Diagnosis present

## 2021-05-22 DIAGNOSIS — J9601 Acute respiratory failure with hypoxia: Principal | ICD-10-CM

## 2021-05-22 DIAGNOSIS — Z794 Long term (current) use of insulin: Secondary | ICD-10-CM | POA: Diagnosis not present

## 2021-05-22 DIAGNOSIS — D72828 Other elevated white blood cell count: Secondary | ICD-10-CM | POA: Diagnosis present

## 2021-05-22 DIAGNOSIS — Z9012 Acquired absence of left breast and nipple: Secondary | ICD-10-CM | POA: Diagnosis not present

## 2021-05-22 DIAGNOSIS — R9389 Abnormal findings on diagnostic imaging of other specified body structures: Secondary | ICD-10-CM | POA: Diagnosis present

## 2021-05-22 DIAGNOSIS — Z87891 Personal history of nicotine dependence: Secondary | ICD-10-CM | POA: Diagnosis not present

## 2021-05-22 DIAGNOSIS — I2510 Atherosclerotic heart disease of native coronary artery without angina pectoris: Secondary | ICD-10-CM | POA: Diagnosis present

## 2021-05-22 DIAGNOSIS — Z8249 Family history of ischemic heart disease and other diseases of the circulatory system: Secondary | ICD-10-CM | POA: Diagnosis not present

## 2021-05-22 DIAGNOSIS — E782 Mixed hyperlipidemia: Secondary | ICD-10-CM | POA: Diagnosis present

## 2021-05-22 DIAGNOSIS — Z7982 Long term (current) use of aspirin: Secondary | ICD-10-CM | POA: Diagnosis not present

## 2021-05-22 LAB — CBC
HCT: 34.9 % — ABNORMAL LOW (ref 36.0–46.0)
Hemoglobin: 11.1 g/dL — ABNORMAL LOW (ref 12.0–15.0)
MCH: 30.9 pg (ref 26.0–34.0)
MCHC: 31.8 g/dL (ref 30.0–36.0)
MCV: 97.2 fL (ref 80.0–100.0)
Platelets: 198 10*3/uL (ref 150–400)
RBC: 3.59 MIL/uL — ABNORMAL LOW (ref 3.87–5.11)
RDW: 13.3 % (ref 11.5–15.5)
WBC: 12 10*3/uL — ABNORMAL HIGH (ref 4.0–10.5)
nRBC: 0 % (ref 0.0–0.2)

## 2021-05-22 LAB — COMPREHENSIVE METABOLIC PANEL
ALT: 19 U/L (ref 0–44)
AST: 23 U/L (ref 15–41)
Albumin: 3.4 g/dL — ABNORMAL LOW (ref 3.5–5.0)
Alkaline Phosphatase: 72 U/L (ref 38–126)
Anion gap: 6 (ref 5–15)
BUN: 32 mg/dL — ABNORMAL HIGH (ref 8–23)
CO2: 31 mmol/L (ref 22–32)
Calcium: 8.4 mg/dL — ABNORMAL LOW (ref 8.9–10.3)
Chloride: 93 mmol/L — ABNORMAL LOW (ref 98–111)
Creatinine, Ser: 1.85 mg/dL — ABNORMAL HIGH (ref 0.44–1.00)
GFR, Estimated: 27 mL/min — ABNORMAL LOW (ref >60)
Glucose, Bld: 50 mg/dL — ABNORMAL LOW (ref 70–99)
Potassium: 3.6 mmol/L (ref 3.5–5.1)
Sodium: 130 mmol/L — ABNORMAL LOW (ref 135–145)
Total Bilirubin: 0.3 mg/dL (ref 0.3–1.2)
Total Protein: 6.5 g/dL (ref 6.5–8.1)

## 2021-05-22 LAB — BASIC METABOLIC PANEL
Anion gap: 8 (ref 5–15)
BUN: 30 mg/dL — ABNORMAL HIGH (ref 8–23)
CO2: 30 mmol/L (ref 22–32)
Calcium: 8.6 mg/dL — ABNORMAL LOW (ref 8.9–10.3)
Chloride: 93 mmol/L — ABNORMAL LOW (ref 98–111)
Creatinine, Ser: 1.77 mg/dL — ABNORMAL HIGH (ref 0.44–1.00)
GFR, Estimated: 28 mL/min — ABNORMAL LOW (ref >60)
Glucose, Bld: 143 mg/dL — ABNORMAL HIGH (ref 70–99)
Potassium: 4.4 mmol/L (ref 3.5–5.1)
Sodium: 131 mmol/L — ABNORMAL LOW (ref 135–145)

## 2021-05-22 LAB — OSMOLALITY: Osmolality: 290 mOsm/kg (ref 275–295)

## 2021-05-22 LAB — GLUCOSE, CAPILLARY
Glucose-Capillary: 62 mg/dL — ABNORMAL LOW (ref 70–99)
Glucose-Capillary: 155 mg/dL — ABNORMAL HIGH (ref 70–99)
Glucose-Capillary: 146 mg/dL — ABNORMAL HIGH (ref 70–99)
Glucose-Capillary: 112 mg/dL — ABNORMAL HIGH (ref 70–99)
Glucose-Capillary: 191 mg/dL — ABNORMAL HIGH (ref 70–99)

## 2021-05-22 LAB — PROCALCITONIN: Procalcitonin: <0.1 ng/mL

## 2021-05-22 LAB — TROPONIN I (HIGH SENSITIVITY): Troponin I (High Sensitivity): 8 ng/L (ref <18)

## 2021-05-22 LAB — APTT: aPTT: 28 seconds (ref 24–36)

## 2021-05-22 LAB — PHOSPHORUS: Phosphorus: 4.8 mg/dL — ABNORMAL HIGH (ref 2.5–4.6)

## 2021-05-22 LAB — MAGNESIUM: Magnesium: 2 mg/dL (ref 1.7–2.4)

## 2021-05-22 MED ORDER — ENOXAPARIN SODIUM 30 MG/0.3ML IJ SOSY
30.0000 mg | PREFILLED_SYRINGE | INTRAMUSCULAR | Status: DC
  Administered 2021-05-22 – 2021-05-24 (×3): 30 mg via SUBCUTANEOUS
  Filled 2021-05-22 (×3): qty 0.3

## 2021-05-22 MED ORDER — PRAVASTATIN SODIUM 40 MG PO TABS
80.0000 mg | ORAL_TABLET | Freq: Every day | ORAL | Status: DC
  Administered 2021-05-22 – 2021-05-24 (×3): 80 mg via ORAL
  Filled 2021-05-22 (×3): qty 2

## 2021-05-22 MED ORDER — DM-GUAIFENESIN ER 30-600 MG PO TB12
1.0000 | ORAL_TABLET | Freq: Two times a day (BID) | ORAL | Status: DC
  Administered 2021-05-22 – 2021-05-24 (×5): 1 via ORAL
  Filled 2021-05-22 (×5): qty 1

## 2021-05-22 MED ORDER — SODIUM CHLORIDE 0.9 % IV SOLN
500.0000 mg | Freq: Once | INTRAVENOUS | Status: AC
  Administered 2021-05-22: 500 mg via INTRAVENOUS
  Filled 2021-05-22: qty 5

## 2021-05-22 MED ORDER — SODIUM CHLORIDE 0.9 % IV SOLN
1.0000 g | INTRAVENOUS | Status: DC
  Administered 2021-05-22 – 2021-05-23 (×2): 1 g via INTRAVENOUS
  Filled 2021-05-22 (×2): qty 10

## 2021-05-22 MED ORDER — ALPRAZOLAM 0.5 MG PO TABS
0.5000 mg | ORAL_TABLET | Freq: Three times a day (TID) | ORAL | Status: DC | PRN
  Administered 2021-05-22 – 2021-05-23 (×2): 0.5 mg via ORAL
  Filled 2021-05-22 (×2): qty 1

## 2021-05-22 MED ORDER — CARVEDILOL 12.5 MG PO TABS
12.5000 mg | ORAL_TABLET | Freq: Two times a day (BID) | ORAL | Status: DC
  Administered 2021-05-22 – 2021-05-24 (×5): 12.5 mg via ORAL
  Filled 2021-05-22 (×5): qty 1

## 2021-05-22 MED ORDER — SODIUM CHLORIDE 0.9 % IV SOLN: INTRAVENOUS | Status: AC

## 2021-05-22 MED ORDER — ACETAMINOPHEN 325 MG PO TABS: 650.0000 mg | ORAL_TABLET | Freq: Four times a day (QID) | ORAL | Status: DC | PRN

## 2021-05-22 MED ORDER — SODIUM CHLORIDE 0.9 % IV SOLN
500.0000 mg | INTRAVENOUS | Status: DC
  Administered 2021-05-22 – 2021-05-23 (×2): 500 mg via INTRAVENOUS
  Filled 2021-05-22 (×3): qty 5

## 2021-05-22 MED ORDER — INSULIN ASPART 100 UNIT/ML IJ SOLN
0.0000 [IU] | Freq: Three times a day (TID) | INTRAMUSCULAR | Status: DC
  Administered 2021-05-22: 1 [IU] via SUBCUTANEOUS
  Administered 2021-05-22: 2 [IU] via SUBCUTANEOUS
  Administered 2021-05-23: 3 [IU] via SUBCUTANEOUS
  Administered 2021-05-23 (×2): 1 [IU] via SUBCUTANEOUS
  Administered 2021-05-24: 2 [IU] via SUBCUTANEOUS
  Administered 2021-05-24: 5 [IU] via SUBCUTANEOUS

## 2021-05-22 MED ORDER — INSULIN ASPART 100 UNIT/ML IJ SOLN
0.0000 [IU] | Freq: Every day | INTRAMUSCULAR | Status: DC
  Administered 2021-05-23: 2 [IU] via SUBCUTANEOUS

## 2021-05-22 MED ORDER — NITROGLYCERIN 0.4 MG SL SUBL: 0.4000 mg | SUBLINGUAL_TABLET | SUBLINGUAL | Status: DC | PRN

## 2021-05-22 MED ORDER — SODIUM CHLORIDE 0.9 % IV SOLN
2.0000 g | Freq: Once | INTRAVENOUS | Status: AC
  Administered 2021-05-22: 2 g via INTRAVENOUS
  Filled 2021-05-22: qty 20

## 2021-05-22 MED ORDER — ONDANSETRON HCL 4 MG/2ML IJ SOLN
4.0000 mg | Freq: Four times a day (QID) | INTRAMUSCULAR | Status: DC | PRN
  Administered 2021-05-22 – 2021-05-23 (×2): 4 mg via INTRAVENOUS
  Filled 2021-05-22 (×2): qty 2

## 2021-05-22 MED ORDER — GUAIFENESIN-DM 100-10 MG/5ML PO SYRP
5.0000 mL | ORAL_SOLUTION | ORAL | Status: DC | PRN
Start: 2021-05-22 — End: 2021-05-24

## 2021-05-22 NOTE — Progress Notes (Signed)
Inpatient Diabetes Program Recommendations ? ?AACE/ADA: New Consensus Statement on Inpatient Glycemic Control (2015) ? ?Target Ranges:  Prepandial:   less than 140 mg/dL ?     Peak postprandial:   less than 180 mg/dL (1-2 hours) ?     Critically ill patients:  140 - 180 mg/dL  ? ?Lab Results  ?Component Value Date  ? GLUCAP 155 (H) 05/22/2021  ? HGBA1C 6.3 (H) 04/05/2021  ? ? ?Review of Glycemic Control ? Latest Reference Range & Units 05/22/21 05:43 05/22/21 07:49  ?Glucose-Capillary 70 - 99 mg/dL 62 (L) 155 (H)  ? ?Diabetes history: DM 2 ?Outpatient Diabetes medications:  ?Humalog 25 units bid, Basaglar 55 units bid ?Current orders for Inpatient glycemic control:  ?Novolog sensitive tid with meals and HS ? ?Inpatient Diabetes Program Recommendations:   ?Patient was on large doses of insulin prior to admit.  ?May need resumption of basal insulin at lower dose.  ?Will follow.  ? ?Thanks,  ?Adah Perl, RN, BC-ADM ?Inpatient Diabetes Coordinator ?Pager [phone]  (8a-5p) ? ? ?

## 2021-05-22 NOTE — Assessment & Plan Note (Addendum)
CT without pneumonia ?? If she has COPD with recent admission with bronchitis and reported "wheezing" prior to admission ?Low suspicion for DVT/PE -> normal HR.  Low risk by wells. ?Does have hx smoking, but she notes only Marie House few years, and quit many years ago. ?Will refer to pulmonology for additional workup outpatient.  ?

## 2021-05-22 NOTE — Assessment & Plan Note (Signed)
BMI 35.40, patient counseled on diet and lifestyle modification ?

## 2021-05-22 NOTE — Assessment & Plan Note (Addendum)
Mild, follow outpatient  

## 2021-05-22 NOTE — ED Notes (Signed)
Pt ambulated in hallway with pulse ox per EDP. Pt oxygen bounced between 88-90% and upon completion, this RN could hear audible wheezing and pt was very SOB. Placed pt on 2L [place] and oxy back up in high 90s at this time. ?

## 2021-05-22 NOTE — Assessment & Plan Note (Signed)
Continue statin. 

## 2021-05-22 NOTE — Hospital Course (Addendum)
87 yo with hx HTN, T2DM, CKD III who presented with SOB and was found to have pneumonia and new o2 requirement.  CT scan later in admission did not show pneumonia.  Concern her new o2 requirement maybe related to undiagnosed COPD? Discharged with home o2 and pulm referral. ? ?See below for additional details  ?

## 2021-05-22 NOTE — Assessment & Plan Note (Addendum)
initially concern for pneumonia, treated with abx ?Requiring 1-2 L [place] ?86% on RA again today -> home o2 screen, requiring 1 L at rest and with activity ?Will follow CT chest -> negative for pneumonia.  Will stop antibiotics and discharge with plan for pulmonary follow up outpatient. ?Ceftriaxone/azithromycin 3/10-12.  Discontinue additional abx.  ?Blood culture (ngtd x2). ? ?

## 2021-05-22 NOTE — H&P (Addendum)
History and Physical    Patient: Marie House DOB: Feb 15, 1935 DOA: 05/21/2021 DOS: the patient was seen and examined on 05/22/2021 PCP: Asencion Noble, MD  Patient coming from: Home  Chief Complaint:  Chief Complaint  Patient presents with   Shortness of Breath   HPI: Marie House is a 87 y.o. female with medical history significant of hypertension, T2DM, CKD stage III who presents to the emergency department due to 2-day onset of increased shortness of breath and nonproductive cough.  She decided to go to the ED for further evaluation and management.  She denies fever, chest pain, headache, nausea, vomiting or abdominal pain.  ED course: In the emergency department, she was tachypneic, BP was soft at 101/44.  Work-up in the ED showed normal CBC except for leukocytosis, hyponatremia, BUN/creatinine at 32/1.90 (creatinine is within baseline range).  Troponin x2 was negative. Chest x-ray showed minimal patchy left basilar opacities may represent atelectasis or infection Patient was started on IV ceftriaxone and azithromycin.  Hospitalist was asked to admit patient for further evaluation and management.  Review of Systems: As mentioned in the history of present illness. All other systems reviewed and are negative.  Past Medical History:  Diagnosis Date   Arthritis    Carotid artery disease ([place])    CKD (chronic kidney disease) stage 3, GFR 30-59 ml/min (HCC)    Coronary atherosclerosis of native coronary artery    Cypher DES LAD 2003, Taxus DES LAD 2004   Essential hypertension, benign    Mixed hyperlipidemia    Type 2 diabetes mellitus ([place])    Past Surgical History:  Procedure Laterality Date   CAROTID ENDARTERECTOMY Right 2004   CATARACT EXTRACTION     CHOLECYSTECTOMY     Left breast mastectomy     25 yrs ago   MASTECTOMY Left    PARTIAL HYSTERECTOMY     TONSILLECTOMY     Social History:  reports that she quit smoking about 15 years ago. Her smoking use included  cigarettes. She has never used smokeless tobacco. She reports that she does not drink alcohol and does not use drugs.  Allergies  Allergen Reactions   Altace [Ramipril]    Atacand [Candesartan]    Avandia [Rosiglitazone]    Crestor [Rosuvastatin]    Diovan [Valsartan]    Lisinopril    Lotensin [Benazepril]    Norvasc [Amlodipine Besylate]    Zetia [Ezetimibe]     Family History  Problem Relation Age of Onset   CAD Mother     Prior to Admission medications   Medication Sig Start Date End Date Taking? Authorizing Provider  ALPRAZolam Duanne Moron) 0.5 MG tablet Take 0.5 mg by mouth 3 (three) times daily as needed for anxiety.    [provider]  aspirin EC 81 MG tablet Take 81 mg by mouth daily.    [provider]  benzonatate (TESSALON) 200 MG capsule Take 200 mg by mouth 3 (three) times daily as needed for cough. 03/31/21   [provider]  carvedilol (COREG) 12.5 MG tablet Take 12.5 mg by mouth 2 (two) times daily with a meal.     [provider]  dextromethorphan-guaiFENesin (MUCINEX DM) 30-600 MG 12hr tablet Take 1 tablet by mouth 2 (two) times daily. 04/06/21   Barton Dubois, MD  DILT-XR 120 MG 24 hr capsule Take 120 mg by mouth daily. 01/08/21   [provider]  HUMALOG KWIKPEN 100 UNIT/ML KwikPen Inject 25 Units into the skin 2 (two) times  a day.  07/26/18   [provider]  hydrochlorothiazide (HYDRODIURIL) 25 MG tablet Take 25 mg by mouth daily. 03/23/20   [provider]  HYDROcodone-acetaminophen (NORCO/VICODIN) 5-325 MG tablet One tablet every six hours for pain.  Limit 7 days. 04/30/21   Sanjuana Kava, MD  Insulin Glargine (BASAGLAR KWIKPEN) 100 UNIT/ML SOPN Inject 55 Units into the skin 2 (two) times daily.    [provider]  Ipratropium-Albuterol (COMBIVENT) 20-100 MCG/ACT AERS respimat Inhale 1 puff into the lungs every 6 (six) hours as needed for wheezing or shortness of breath. 04/06/21   Barton Dubois, MD   Multiple Vitamins-Minerals (CENTRUM SILVER PO) Take 1 tablet by mouth daily with breakfast.    [provider]  nitroGLYCERIN (NITROSTAT) 0.4 MG SL tablet ONE TABLET UNDER TONGUE AS NEEDED FOR CHEST PAIN EVERY 5 MINUTES CONTACT MD AS DIRECTED Patient taking differently: Place 0.4 mg under the tongue every 5 (five) minutes as needed. 12/03/16   Satira Sark, MD  omeprazole (PRILOSEC) 20 MG capsule Take 1 capsule (20 mg total) by mouth 2 (two) times daily before a meal. 04/06/21   Barton Dubois, MD  pravastatin (PRAVACHOL) 80 MG tablet Take 80 mg by mouth daily.    [provider]  predniSONE (DELTASONE) 20 MG tablet Take 3 tablets by mouth daily x1 day; then 2 tablets by mouth daily x2 days; then 1 tablet by mouth daily x3 days; then half tablet by mouth daily x3 days and stop prednisone. 04/06/21   Barton Dubois, MD  traZODone (DESYREL) 50 MG tablet Take 50 mg by mouth at bedtime.    [provider]    Physical Exam: Vitals:   05/22/21 0030 05/22/21 0100 05/22/21 0130 05/22/21 0430  BP: (!) 125/45 (!) 137/56 (!) 149/50 (!) 128/57  Pulse: 73 70 73 67  Resp: (!) 25 (!) 21 (!) 24 16  Temp:      TempSrc:      SpO2: 99% 99% 100% 100%  Weight:      Height:       General: Elderly female. Awake and alert and oriented x3. Not in any acute distress.  HEENT: NCAT.  PERRLA. EOMI. Sclerae anicteric.  Moist mucosal membranes. Neck: Neck supple without lymphadenopathy. No carotid bruits. No masses palpated.  Cardiovascular: Regular rate with normal S1-S2 sounds. No murmurs, rubs or gallops auscultated. No JVD.  Respiratory: Tachypnea, rales in left lower lobe.  No accessory muscle use. Abdomen: Soft, nontender, nondistended. Active bowel sounds. No masses or hepatosplenomegaly  Skin: No rashes, lesions, or ulcerations.  Dry, warm to touch. Musculoskeletal:  2+ dorsalis pedis and radial pulses. Good ROM.  No contractures  Psychiatric: Intact judgment and insight.  Mood  appropriate to current condition. Neurologic: No focal neurological deficits. Strength is 5/5 x 4.  CN II - XII grossly intact.  Data Reviewed: EKG personally reviewed showed normal sinus rhythm at a rate of 77 bpm with borderline increased PR interval  Assessment and Plan: * CAP (community acquired pneumonia) Chest x-ray was suggestive of pneumonia Patient was started on ceftriaxone and azithromycin, we shall continue same at this time with plan to de-escalate/discontinue based on blood culture, sputum culture, urine Legionella, strep pneumo and procalcitonin Continue Tylenol as needed Continue Mucinex, Robitussin, incentive spirometry, flutter valve     Acute respiratory failure with hypoxia ([place]) Patient's O2 sats dropped to 88 to 90% on ambulation, supplemental oxygen via [place] was provided with improved O2 sats Continue supplemental oxygen to maintain O2  sats > 92% we will plan to wean patient off the oxygen as tolerated (patient does not use oxygen at baseline).  CKD (chronic kidney disease) stage 4, GFR 15-29 ml/min (HCC) BUN/creatinine at 32/1.90 (creatinine is within baseline range).  Renally adjust medications, avoid nephrotoxic agents/dehydration/hypotension   Hyponatremia Na 130, possibly reactive or due to diuretic effect Continue gentle hydration Continue to monitor sodium with serial BMPs Urine osmolality, serum osmolality and urine sodium will be checked   Leukocytosis WBC 12.4, this may be related to pneumonia versus reactive Continue treatment as described for pneumonia  Obesity (BMI 30-39.9) BMI 35.40, patient counseled on diet and lifestyle modification  CAD (coronary artery disease) Continue statin, nitroglycerin, Coreg  Essential hypertension, benign Continue home Coreg HCTZ will be held at this time due to hyponatremia  Mixed hyperlipidemia Continue statin  Type 2 diabetes mellitus (HCC) Hemoglobin A1c on 1/22 was 6.3 Continue ISS and hypoglycemia  protocol      Advance Care Planning:   Code Status: Full Code   Consults: None  Family Communication: None at bedside  Severity of Illness: The appropriate patient status for this patient is INPATIENT. Inpatient status is judged to be reasonable and necessary in order to provide the required intensity of service to ensure the patient's safety. The patient's presenting symptoms, physical exam findings, and initial radiographic and laboratory data in the context of their chronic comorbidities is felt to place them at high risk for further clinical deterioration. Furthermore, it is not anticipated that the patient will be medically stable for discharge from the hospital within 2 midnights of admission.   * I certify that at the point of admission it is my clinical judgment that the patient will require inpatient hospital care spanning beyond 2 midnights from the point of admission due to high intensity of service, high risk for further deterioration and high frequency of surveillance required.*  Author: Bernadette Hoit, DO 05/22/2021 5:16 AM  For on call review [email or web address].

## 2021-05-22 NOTE — Assessment & Plan Note (Signed)
Continue home Coreg ?HCTZ will be held at this time due to hyponatremia ?

## 2021-05-22 NOTE — Assessment & Plan Note (Addendum)
Hemoglobin A1c on 1/22 was 6.3 ?Continue ISS and hypoglycemia protocol ?Had hypoglycemia at presentation - basaglar 55 units BID and 25 units humalog BID (she takes 52 units basaglar BID and 15 units humalog BID per report) - will recommend she half her insulin at discharge (to 25 units basaglar BID and 7 units humalog BID) ?

## 2021-05-22 NOTE — Assessment & Plan Note (Addendum)
Improved, follow outpatient ?Hold HCTZ at discharge ? ?

## 2021-05-22 NOTE — Assessment & Plan Note (Addendum)
Creatinine close to baseline, follow ?Renally adjust medications, avoid nephrotoxic agents/dehydration/hypotension ? ?

## 2021-05-22 NOTE — Assessment & Plan Note (Signed)
Continue statin, nitroglycerin, Coreg ?

## 2021-05-22 NOTE — Progress Notes (Signed)
?PROGRESS NOTE ? ? ? ?ATLAS KUC  PQD:[id number] DOB: [birth year] DOA: 05/21/2021 ?PCP: Asencion Noble, MD  ?Chief Complaint  ?Patient presents with  ? Shortness of Breath  ? ? ?Brief Narrative:  ?87 yo with hx HTN, T2DM, CKD III who presented with SOB and was found to have pneumonia and new o2 requirement. ? ?See below for additional details   ? ? ?Assessment & Plan: ?  ?Principal Problem: ?  CAP (community acquired pneumonia) ?Active Problems: ?  Acute respiratory failure with hypoxia ([place]) ?  Leukocytosis ?  Hyponatremia ?  CKD (chronic kidney disease) stage 4, GFR 15-29 ml/min (HCC) ?  Type 2 diabetes mellitus ([place]) ?  Mixed hyperlipidemia ?  Essential hypertension, benign ?  CAD (coronary artery disease) ?  Obesity (BMI 30-39.9) ? ? ?Assessment and Plan: ?* CAP (community acquired pneumonia) ?Requiring 2 L [place] ?CXR with patchy L basilar opacities ?Ceftriaxone/azithromycin 3/10-present ?Blood culture, sputum culture, urine Legionella, strep pneumo and procalcitonin ?Continue Tylenol as needed ?Continue Mucinex, Robitussin, incentive spirometry, flutter valve  ? ? ? ?Acute respiratory failure with hypoxia (HCC) ?2/2 pneumonia ?Treatment as below ? ?Leukocytosis ?Suspect 2/2 pneumonia ? ?Hyponatremia ?Mild, follow after IVF ? ? ?CKD (chronic kidney disease) stage 4, GFR 15-29 ml/min (HCC) ?Creatinine close to baseline, follow ?Renally adjust medications, avoid nephrotoxic agents/dehydration/hypotension ? ? ?Type 2 diabetes mellitus ([place]) ?Hemoglobin A1c on 1/22 was 6.3 ?Continue ISS and hypoglycemia protocol ?Had hypoglycemia at presentation - basaglar 55 units BID and 25 units humalog BID - likely needs adjustment in regimen going forward ? ?Mixed hyperlipidemia ?Continue statin ? ?Essential hypertension, benign ?Continue home Coreg ?HCTZ will be held at this time due to hyponatremia ? ?CAD (coronary artery disease) ?Continue statin, nitroglycerin, Coreg ? ?Obesity (BMI 30-39.9) ?BMI 35.40, patient counseled on diet and  lifestyle modification ? ? ?Awaiting med rec ? ? ? ?DVT prophylaxis: lovenox ?Code Status: full ?Family Communication: none ?Disposition:  ? ?Status is: Inpatient ?Remains inpatient appropriate because: need for o2 and IV abx ?  ?Consultants:  ?none ? ?Procedures:  ?none ? ?Antimicrobials:  ?Anti-infectives (From admission, onward)  ? ? Start     Dose/Rate Route Frequency Ordered Stop  ? 05/22/21 2200  cefTRIAXone (ROCEPHIN) 1 g in sodium chloride 0.9 % 100 mL IVPB       ? 1 g ?200 mL/hr over 30 Minutes Intravenous Every 24 hours 05/22/21 0502    ? 05/22/21 2200  azithromycin (ZITHROMAX) 500 mg in sodium chloride 0.9 % 250 mL IVPB       ? 500 mg ?250 mL/hr over 60 Minutes Intravenous Every 24 hours 05/22/21 0502    ? 05/22/21 0130  cefTRIAXone (ROCEPHIN) 2 g in sodium chloride 0.9 % 100 mL IVPB       ? 2 g ?200 mL/hr over 30 Minutes Intravenous  Once 05/22/21 0125 05/22/21 0205  ? 05/22/21 0130  azithromycin (ZITHROMAX) 500 mg in sodium chloride 0.9 % 250 mL IVPB       ? 500 mg ?250 mL/hr over 60 Minutes Intravenous  Once 05/22/21 0125 05/22/21 0309  ? ?  ? ? ?Subjective: ?No new complaints ? ?Objective: ?Vitals:  ? 05/22/21 0541 05/22/21 0758 05/22/21 1028 05/22/21 1359  ?BP: (!) 165/64 (!) 160/54 (!) 149/53 (!) 153/51  ?Pulse: 66 78 72 75  ?Resp: '18  18 20  '$ ?Temp: 97.6 ?F (36.4 ?C)   (!) 97.5 ?F (36.4 ?C)  ?TempSrc:    Oral  ?SpO2: 100%  99% 100%  ?  Weight:      ?Height:      ? ? ?Intake/Output Summary (Last 24 hours) at 05/22/2021 1614 ?Last data filed at 05/22/2021 1526 ?Gross per 24 hour  ?Intake 1744.87 ml  ?Output --  ?Net 1744.87 ml  ? ?Filed Weights  ? 05/21/21 2210  ?Weight: 96.5 kg  ? ? ?Examination: ? ?General exam: Appears calm and comfortable  ?Respiratory system: on 2 L, diminished breath sounds ?Cardiovascular system: S1 & S2 heard, RRR. ?Gastrointestinal system: Abdomen is nondistended, soft and nontender.  ?Central nervous system: Alert and oriented. No focal neurological deficits. ?Extremities: no  LEE ?Skin: No rashes, lesions or ulcers ?Psychiatry: Judgement and insight appear normal. Mood & affect appropriate.  ? ? ? ?Data Reviewed: I have personally reviewed following labs and imaging studies ? ?CBC: ?Recent Labs  ?Lab 05/21/21 ?2253 05/22/21 ?0420  ?WBC 12.4* 12.0*  ?HGB 11.3* 11.1*  ?HCT 34.9* 34.9*  ?MCV 97.5 97.2  ?PLT 202 198  ? ? ?Basic Metabolic Panel: ?Recent Labs  ?Lab 05/21/21 ?2253 05/22/21 ?0420 05/22/21 ?1140  ?NA 130* 130* 131*  ?K 3.9 3.6 4.4  ?CL 90* 93* 93*  ?CO2 '31 31 30  '$ ?GLUCOSE 84 50* 143*  ?BUN 32* 32* 30*  ?CREATININE 1.90* 1.85* 1.77*  ?CALCIUM 8.7* 8.4* 8.6*  ?MG  --  2.0  --   ?PHOS  --  4.8*  --   ? ? ?GFR: ?Estimated Creatinine Clearance: 27.2 mL/min (Tyshea Imel) (by C-G formula based on SCr of 1.77 mg/dL (H)). ? ?Liver Function Tests: ?Recent Labs  ?Lab 05/21/21 ?2253 05/22/21 ?0420  ?AST 24 23  ?ALT 21 19  ?ALKPHOS 80 72  ?BILITOT 0.2* 0.3  ?PROT 6.7 6.5  ?ALBUMIN 3.6 3.4*  ? ? ?CBG: ?Recent Labs  ?Lab 05/22/21 ?2536 05/22/21 ?6440 05/22/21 ?1129  ?GLUCAP 62* 155* 146*  ? ? ? ?Recent Results (from the past 240 hour(s))  ?Culture, blood (Routine X 2) w Reflex to ID Panel     Status: None (Preliminary result)  ? Collection Time: 05/22/21  6:15 AM  ? Specimen: Right Antecubital; Blood  ?Result Value Ref Range Status  ? Specimen Description   Final  ?  RIGHT ANTECUBITAL BOTTLES DRAWN AEROBIC AND ANAEROBIC  ? Special Requests   Final  ?  Blood Culture adequate volume ?Performed at [facility], [address], [place], [place] [address] ?  ? Culture PENDING  Incomplete  ? Report Status PENDING  Incomplete  ?Culture, blood (Routine X 2) w Reflex to ID Panel     Status: None (Preliminary result)  ? Collection Time: 05/22/21  6:16 AM  ? Specimen: BLOOD RIGHT HAND  ?Result Value Ref Range Status  ? Specimen Description   Final  ?  BLOOD RIGHT HAND BOTTLES DRAWN AEROBIC AND ANAEROBIC  ? Special Requests   Final  ?  Blood Culture adequate volume ?Performed at [facility], [address],  [place], [place] [address] ?  ? Culture PENDING  Incomplete  ? Report Status PENDING  Incomplete  ?  ? ? ? ? ? ?Radiology Studies: ?DG Chest Portable 1 View ? ?Result Date: 05/21/2021 ?CLINICAL DATA:  Shortness of breath. EXAM: PORTABLE CHEST 1 VIEW COMPARISON:  Chest x-ray 07/07/2017. FINDINGS: There is some minimal patchy opacities in the left lung base. The lungs are otherwise clear. There is no pleural effusion or pneumothorax identified. The cardiomediastinal silhouette is within normal limits. No acute fractures are seen. IMPRESSION: 1. Minimal patchy left basilar opacities may represent atelectasis  or infection. Followup PA and lateral chest X-ray is recommended in 3-4 weeks following trial of antibiotic therapy to ensure resolution and exclude underlying malignancy. Electronically Signed   By: Ronney Asters M.D.   On: 05/21/2021 22:43   ? ? ? ? ? ?Scheduled Meds: ? carvedilol  12.5 mg Oral BID WC  ? dextromethorphan-guaiFENesin  1 tablet Oral BID  ? enoxaparin (LOVENOX) injection  30 mg Subcutaneous Q24H  ? insulin aspart  0-5 Units Subcutaneous QHS  ? insulin aspart  0-9 Units Subcutaneous TID WC  ? pravastatin  80 mg Oral Daily  ? ?Continuous Infusions: ? azithromycin    ? cefTRIAXone (ROCEPHIN)  IV    ? ? ? LOS: 0 days  ? ? ?Time spent: over 30 min ? ? ? ?Fayrene Helper, MD ?[facility] ? ? ?To contact the attending provider between 7A-7P or the covering provider during after hours 7P-7A, please log into the web site [email or web address] and access using universal [place] password for that web site. If you do not have the password, please call the hospital operator. ? ?05/22/2021, 4:14 PM  ? ? ?

## 2021-05-23 LAB — GLUCOSE, CAPILLARY
Glucose-Capillary: 124 mg/dL — ABNORMAL HIGH (ref 70–99)
Glucose-Capillary: 224 mg/dL — ABNORMAL HIGH (ref 70–99)
Glucose-Capillary: 149 mg/dL — ABNORMAL HIGH (ref 70–99)
Glucose-Capillary: 243 mg/dL — ABNORMAL HIGH (ref 70–99)

## 2021-05-23 LAB — GLUCOSE, RANDOM: Glucose, Bld: 224 mg/dL — ABNORMAL HIGH (ref 70–99)

## 2021-05-23 MED ORDER — ASPIRIN EC 81 MG PO TBEC
81.0000 mg | DELAYED_RELEASE_TABLET | Freq: Every day | ORAL | Status: DC
Start: 2021-05-23 — End: 2021-05-24
  Administered 2021-05-23 – 2021-05-24 (×2): 81 mg via ORAL
  Filled 2021-05-23 (×3): qty 1

## 2021-05-23 MED ORDER — PANTOPRAZOLE SODIUM 40 MG PO TBEC
40.0000 mg | DELAYED_RELEASE_TABLET | Freq: Every day | ORAL | Status: DC
  Administered 2021-05-23 – 2021-05-24 (×2): 40 mg via ORAL
  Filled 2021-05-23 (×2): qty 1

## 2021-05-23 MED ORDER — ALBUTEROL SULFATE (2.5 MG/3ML) 0.083% IN NEBU
2.5000 mg | INHALATION_SOLUTION | RESPIRATORY_TRACT | Status: DC | PRN
  Administered 2021-05-23: 2.5 mg via RESPIRATORY_TRACT
  Filled 2021-05-23: qty 3

## 2021-05-23 MED ORDER — TRAZODONE HCL 50 MG PO TABS
50.0000 mg | ORAL_TABLET | Freq: Every day | ORAL | Status: DC
  Administered 2021-05-23: 50 mg via ORAL
  Filled 2021-05-23: qty 1

## 2021-05-23 MED ORDER — IPRATROPIUM-ALBUTEROL 0.5-2.5 (3) MG/3ML IN SOLN
3.0000 mL | Freq: Four times a day (QID) | RESPIRATORY_TRACT | Status: DC | PRN
  Administered 2021-05-23: 3 mL via RESPIRATORY_TRACT
  Filled 2021-05-23: qty 3

## 2021-05-23 MED ORDER — DILTIAZEM HCL ER 120 MG PO CP24
120.0000 mg | ORAL_CAPSULE | Freq: Every day | ORAL | Status: DC
  Administered 2021-05-23 – 2021-05-24 (×2): 120 mg via ORAL
  Filled 2021-05-23 (×5): qty 1

## 2021-05-23 NOTE — Progress Notes (Signed)
Patient complained of shortness of breath, oxygen saturation 97% at 2 liters. Diminished lung sounds. Noted no use of accessory muscles. Patient stated she has been feeling this way for a few days. MD Florene Glen made aware. New orders placed. ? ? ?

## 2021-05-23 NOTE — Progress Notes (Signed)
SATURATION QUALIFICATIONS: (This note is used to comply with regulatory documentation for home oxygen) ? ?Patient Saturations on Room Air at Rest = 87% ? ?Patient Saturations on 3 Liters of oxygen while Ambulating = 93% ? ?Please briefly explain why patient needs home oxygen: Patient sitting at rest oxygen saturation was 87% Room air. While patient was at rest placed patient on 2 liters oxygen saturation increased to 95%. While patient was ambulating oxygen saturation dropped quickly to 90% at 2 liters increased oxygen to 3 liters oxygen saturation increased and maintained at 93% while ambulating. MD Florene Glen made aware.  ? ?

## 2021-05-23 NOTE — Evaluation (Signed)
Physical Therapy Evaluation ?Patient Details ?Name: Marie House ?MRN: [id number] ?DOB: 08/27/1934 ?Today's Date: 05/23/2021 ? ?History of Present Illness ? Marie House is a 87 y.o. female with medical history significant of hypertension, T2DM, CKD stage III who presents to the emergency department due to 2-day onset of increased shortness of breath and nonproductive cough.  Pt was found to have pneumonia and has now been placed on O2  ?Clinical Impression ? Pt states that she normally does not use her walker at home but does have one.  Pt was encouraged to breathe through her nose to increase O2 levels, use her rolling walker at home until she is feeling stronger.  No noted weakness or loss of balance when ambulating but O2 levels did decreased but remained level when O2 was increased to 3L . ?   ? ?Recommendations for follow up therapy are one component of a multi-disciplinary discharge planning process, led by the attending physician.  Recommendations may be updated based on patient status, additional functional criteria and insurance authorization. ? ?Follow Up Recommendations No PT follow up; pt difficulties appear to be respiratory not physical in nature.   ?   ?   ?   ?   ?   ?Precautions / Restrictions Precautions ?Precautions: None ?Restrictions ?Weight Bearing Restrictions: No  ? ?  ? ?Mobility ? Bed Mobility ?Overal bed mobility: Independent ?  ?  ?  ?  ?  ?  ?  ?  ? ?Transfers ?Overall transfer level: Independent ?  ?  ?  ?  ?  ?  ?  ?  ?  ?  ? ?Ambulation/Gait ?Ambulation/Gait assistance: Modified independent (Device/Increase time) ?Gait Distance (Feet): 240 Feet ?Assistive device: Rolling walker (2 wheels), None ?Gait Pattern/deviations: WFL(Within Functional Limits) ?Gait velocity: normal for age ?  ?  ?General Gait Details: began on 2 L O2; O2 level dropped to 90 increased O2 to 3L and O2 stayed between 92-94 ? ?  ? ? ? ? ?Pertinent Vitals/Pain Pain Assessment ?Pain Assessment: No/denies pain  ? ? ?    ?Prior Function  Mod I  ?  ?  ?  ?  ?  ?  ?  ?  ?  ? ? ?   ?Extremity/Trunk Assessment  ?   ?  ? ?Lower Extremity Assessment ?Lower Extremity Assessment: Overall WFL for tasks assessed ?  ? ?   ?Communication  ?  No issues noted  ?Cognition Arousal/Alertness: Awake/alert ?  ?Overall Cognitive Status: Within Functional Limits for tasks assessed ?  ?  ?  ?  ?  ?  ?  ?  ?  ?  ?  ?  ?  ?  ?  ?  ?  ?  ?  ? ?  ?   ?   ? ?Assessment/Plan  ?  ?PT Assessment Patient does not need any further PT services (Pt problem is respiratory in nature not physical)  ?   ?   ?   ? ?   ?AM-PAC PT "6 Clicks" Mobility  ?Outcome Measure Help needed turning from your back to your side while in a flat bed without using bedrails?: None ?Help needed moving from lying on your back to sitting on the side of a flat bed without using bedrails?: None ?Help needed moving to and from a bed to a chair (including a wheelchair)?: None ?Help needed standing up from a chair using your arms (e.g., wheelchair or bedside chair)?: None ?Help needed to  walk in hospital room?: None ?Help needed climbing 3-5 steps with a railing? : A Little ?6 Click Score: 23 ? ?  ?End of Session Equipment Utilized During Treatment: Gait belt ?Activity Tolerance: Patient tolerated treatment well ?Patient left: in chair;with call bell/phone within reach ?Nurse Communication: Mobility status ?PT Visit Diagnosis: Other (comment) ?  ? ?Time: 1230-1250 ?PT Time Calculation (min) (ACUTE ONLY): 20 min ? ? ?Charges:   PT Evaluation ?$PT Eval Low Complexity: 1 Low ?  ?  ?   ? ? ?Rayetta Humphrey, PT CLT ?[phone]  ?05/23/2021, 12:52 PM ? ?

## 2021-05-23 NOTE — Progress Notes (Signed)
?PROGRESS NOTE ? ? ? ?Marie House  NGE:[id number] DOB: 1935/01/08 DOA: 05/21/2021 ?PCP: Asencion Noble, MD  ?Chief Complaint  ?Patient presents with  ? Shortness of Breath  ? ? ?Brief Narrative:  ?87 yo with hx HTN, T2DM, CKD III who presented with SOB and was found to have pneumonia and new o2 requirement. ? ?See below for additional details   ? ? ?Assessment & Plan: ?  ?Principal Problem: ?  CAP (community acquired pneumonia) ?Active Problems: ?  Acute respiratory failure with hypoxia ([place]) ?  Leukocytosis ?  Hyponatremia ?  CKD (chronic kidney disease) stage 4, GFR 15-29 ml/min (HCC) ?  Type 2 diabetes mellitus ([place]) ?  Mixed hyperlipidemia ?  Essential hypertension, benign ?  CAD (coronary artery disease) ?  Obesity (BMI 30-39.9) ? ? ?Assessment and Plan: ?* CAP (community acquired pneumonia) ?Requiring 2 L [place] ?86% on RA today ?CXR with patchy L basilar opacities ?Ceftriaxone/azithromycin 3/10-present ?Blood culture (ngtd x1), sputum culture, urine Legionella, strep pneumo and procalcitonin ?Continue Tylenol as needed ?Continue Mucinex, Robitussin, incentive spirometry, flutter valve  ? ? ? ?Acute respiratory failure with hypoxia (HCC) ?2/2 pneumonia ?Treatment as below ? ?Leukocytosis ?Suspect 2/2 pneumonia ? ?Hyponatremia ?Mild, follow after IVF ? ? ?CKD (chronic kidney disease) stage 4, GFR 15-29 ml/min (HCC) ?Creatinine close to baseline, follow ?Renally adjust medications, avoid nephrotoxic agents/dehydration/hypotension ? ? ?Type 2 diabetes mellitus ([place]) ?Hemoglobin A1c on 1/22 was 6.3 ?Continue ISS and hypoglycemia protocol ?Had hypoglycemia at presentation - basaglar 55 units BID and 25 units humalog BID - likely needs adjustment in regimen going forward ? ?Mixed hyperlipidemia ?Continue statin ? ?Essential hypertension, benign ?Continue home Coreg ?HCTZ will be held at this time due to hyponatremia ? ?CAD (coronary artery disease) ?Continue statin, nitroglycerin, Coreg ? ?Obesity (BMI 30-39.9) ?BMI 35.40,  patient counseled on diet and lifestyle modification ? ? ?Awaiting med rec ? ?DVT prophylaxis: lovenox ?Code Status: full ?Family Communication: none ?Disposition:  ? ?Status is: Inpatient ?Remains inpatient appropriate because: need for o2 and IV abx ?  ?Consultants:  ?none ? ?Procedures:  ?none ? ?Antimicrobials:  ?Anti-infectives (From admission, onward)  ? ? Start     Dose/Rate Route Frequency Ordered Stop  ? 05/22/21 2200  cefTRIAXone (ROCEPHIN) 1 g in sodium chloride 0.9 % 100 mL IVPB       ? 1 g ?200 mL/hr over 30 Minutes Intravenous Every 24 hours 05/22/21 0502    ? 05/22/21 2200  azithromycin (ZITHROMAX) 500 mg in sodium chloride 0.9 % 250 mL IVPB       ? 500 mg ?250 mL/hr over 60 Minutes Intravenous Every 24 hours 05/22/21 0502    ? 05/22/21 0130  cefTRIAXone (ROCEPHIN) 2 g in sodium chloride 0.9 % 100 mL IVPB       ? 2 g ?200 mL/hr over 30 Minutes Intravenous  Once 05/22/21 0125 05/22/21 0205  ? 05/22/21 0130  azithromycin (ZITHROMAX) 500 mg in sodium chloride 0.9 % 250 mL IVPB       ? 500 mg ?250 mL/hr over 60 Minutes Intravenous  Once 05/22/21 0125 05/22/21 0309  ? ?  ? ? ?Subjective: ?No new complaints ? ?Objective: ?Vitals:  ? 05/22/21 2158 05/23/21 0416 05/23/21 0813 05/23/21 0927  ?BP: (!) 164/59 137/63 (!) 158/61   ?Pulse: 88 85 87   ?Resp: 20 18    ?Temp: (!) 97.5 ?F (36.4 ?C) 97.8 ?F (36.6 ?C)    ?TempSrc: Oral Oral    ?SpO2: 96% 99% 97% Marland Kitchen)  88%  ?Weight:      ?Height:      ? ? ?Intake/Output Summary (Last 24 hours) at 05/23/2021 1042 ?Last data filed at 05/23/2021 0900 ?Gross per 24 hour  ?Intake 1869.87 ml  ?Output --  ?Net 1869.87 ml  ? ?Filed Weights  ? 05/21/21 2210  ?Weight: 96.5 kg  ? ? ?Examination: ? ?General: No acute distress. ?Cardiovascular: Heart sounds show Arlett Goold regular rate, and rhythm.  ?Lungs: diminished - satting 90's on RA when I was in room, but later with resp found her she was in mid 80s on RA ?Abdomen: Soft, nontender, nondistended ?Neurological: Alert and oriented ?3. Moves  all extremities ?4 . Cranial nerves II through XII grossly intact. ?Skin: Warm and dry. No rashes or lesions. ?Extremities: No clubbing or cyanosis. No edema.  ? ?Data Reviewed: I have personally reviewed following labs and imaging studies ? ?CBC: ?Recent Labs  ?Lab 05/21/21 ?2253 05/22/21 ?0420  ?WBC 12.4* 12.0*  ?HGB 11.3* 11.1*  ?HCT 34.9* 34.9*  ?MCV 97.5 97.2  ?PLT 202 198  ? ? ?Basic Metabolic Panel: ?Recent Labs  ?Lab 05/21/21 ?2253 05/22/21 ?0420 05/22/21 ?1140  ?NA 130* 130* 131*  ?K 3.9 3.6 4.4  ?CL 90* 93* 93*  ?CO2 '31 31 30  '$ ?GLUCOSE 84 50* 143*  ?BUN 32* 32* 30*  ?CREATININE 1.90* 1.85* 1.77*  ?CALCIUM 8.7* 8.4* 8.6*  ?MG  --  2.0  --   ?PHOS  --  4.8*  --   ? ? ?GFR: ?Estimated Creatinine Clearance: 27.2 mL/min (Safa Derner) (by C-G formula based on SCr of 1.77 mg/dL (H)). ? ?Liver Function Tests: ?Recent Labs  ?Lab 05/21/21 ?2253 05/22/21 ?0420  ?AST 24 23  ?ALT 21 19  ?ALKPHOS 80 72  ?BILITOT 0.2* 0.3  ?PROT 6.7 6.5  ?ALBUMIN 3.6 3.4*  ? ? ?CBG: ?Recent Labs  ?Lab 05/22/21 ?0749 05/22/21 ?1129 05/22/21 ?1613 05/22/21 ?2202 05/23/21 ?6734  ?GLUCAP 155* 146* 112* 191* 124*  ? ? ? ?Recent Results (from the past 240 hour(s))  ?Culture, blood (Routine X 2) w Reflex to ID Panel     Status: None (Preliminary result)  ? Collection Time: 05/22/21  6:15 AM  ? Specimen: Right Antecubital; Blood  ?Result Value Ref Range Status  ? Specimen Description   Final  ?  RIGHT ANTECUBITAL BOTTLES DRAWN AEROBIC AND ANAEROBIC  ? Special Requests Blood Culture adequate volume  Final  ? Culture   Final  ?  NO GROWTH 1 DAY ?Performed at [facility], [address], [place], [place] [address] ?  ? Report Status PENDING  Incomplete  ?Culture, blood (Routine X 2) w Reflex to ID Panel     Status: None (Preliminary result)  ? Collection Time: 05/22/21  6:16 AM  ? Specimen: BLOOD RIGHT HAND  ?Result Value Ref Range Status  ? Specimen Description   Final  ?  BLOOD RIGHT HAND BOTTLES DRAWN AEROBIC AND ANAEROBIC  ? Special Requests Blood  Culture adequate volume  Final  ? Culture   Final  ?  NO GROWTH 1 DAY ?Performed at [facility], [address], [place], [place] [address] ?  ? Report Status PENDING  Incomplete  ?  ? ? ? ? ? ?Radiology Studies: ?DG Chest Portable 1 View ? ?Result Date: 05/21/2021 ?CLINICAL DATA:  Shortness of breath. EXAM: PORTABLE CHEST 1 VIEW COMPARISON:  Chest x-ray 07/07/2017. FINDINGS: There is some minimal patchy opacities in the left lung base. The lungs are otherwise clear. There is no  pleural effusion or pneumothorax identified. The cardiomediastinal silhouette is within normal limits. No acute fractures are seen. IMPRESSION: 1. Minimal patchy left basilar opacities may represent atelectasis or infection. Followup PA and lateral chest X-ray is recommended in 3-4 weeks following trial of antibiotic therapy to ensure resolution and exclude underlying malignancy. Electronically Signed   By: Ronney Asters M.D.   On: 05/21/2021 22:43   ? ? ? ? ? ?Scheduled Meds: ? carvedilol  12.5 mg Oral BID WC  ? dextromethorphan-guaiFENesin  1 tablet Oral BID  ? enoxaparin (LOVENOX) injection  30 mg Subcutaneous Q24H  ? insulin aspart  0-5 Units Subcutaneous QHS  ? insulin aspart  0-9 Units Subcutaneous TID WC  ? pravastatin  80 mg Oral Daily  ? ?Continuous Infusions: ? azithromycin 500 mg (05/22/21 2145)  ? cefTRIAXone (ROCEPHIN)  IV 1 g (05/22/21 2050)  ? ? ? LOS: 1 day  ? ? ?Time spent: over 30 min ? ? ? ?Fayrene Helper, MD ?[facility] ? ? ?To contact the attending provider between 7A-7P or the covering provider during after hours 7P-7A, please log into the web site [email or web address] and access using universal [place] password for that web site. If you do not have the password, please call the hospital operator. ? ?05/23/2021, 10:42 AM  ? ? ?

## 2021-05-24 ENCOUNTER — Inpatient Hospital Stay (HOSPITAL_COMMUNITY): Payer: Medicare Other

## 2021-05-24 LAB — BASIC METABOLIC PANEL
Anion gap: 8 (ref 5–15)
BUN: 30 mg/dL — ABNORMAL HIGH (ref 8–23)
CO2: 32 mmol/L (ref 22–32)
Calcium: 8.6 mg/dL — ABNORMAL LOW (ref 8.9–10.3)
Chloride: 94 mmol/L — ABNORMAL LOW (ref 98–111)
Creatinine, Ser: 1.59 mg/dL — ABNORMAL HIGH (ref 0.44–1.00)
GFR, Estimated: 32 mL/min — ABNORMAL LOW (ref >60)
Glucose, Bld: 178 mg/dL — ABNORMAL HIGH (ref 70–99)
Potassium: 4.3 mmol/L (ref 3.5–5.1)
Sodium: 134 mmol/L — ABNORMAL LOW (ref 135–145)

## 2021-05-24 LAB — GLUCOSE, CAPILLARY
Glucose-Capillary: 162 mg/dL — ABNORMAL HIGH (ref 70–99)
Glucose-Capillary: 264 mg/dL — ABNORMAL HIGH (ref 70–99)

## 2021-05-24 MED ORDER — BASAGLAR KWIKPEN 100 UNIT/ML ~~LOC~~ SOPN: 25.0000 [IU] | PEN_INJECTOR | Freq: Two times a day (BID) | SUBCUTANEOUS | Status: AC

## 2021-05-24 MED ORDER — HUMALOG KWIKPEN 100 UNIT/ML ~~LOC~~ SOPN
7.0000 [IU] | PEN_INJECTOR | Freq: Two times a day (BID) | SUBCUTANEOUS | 11 refills | Status: AC
Start: 2021-05-24 — End: ?

## 2021-05-24 NOTE — TOC Progression Note (Signed)
Transition of Care (TOC) - Progression Note  ? ? ?Patient Details  ?Name: Marie House ?MRN: [id number] ?Date of Birth: 1934/03/31 ? ?Transition of Care (TOC) CM/SW Contact  ?Kerin Salen, RN ?Phone Number: ?05/24/2021, 1:04 PM ? ?Clinical Narrative: Patient with abnormal oxygen assessment, requires home oxygen. [employer] called, Hassan Rowan the on call service will forward request to Technician.   ? ? ? ?  ?Barriers to Discharge: Continued Medical Work up ? ?Expected Discharge Plan and Services ?  ?  ?  ?  ?  ?                ?  ?  ?  ?  ?  ?  ?  ?  ?  ?  ? ? ?Social Determinants of Health (SDOH) Interventions ?  ? ?Readmission Risk Interventions ?No flowsheet data found. ? ?

## 2021-05-24 NOTE — TOC Transition Note (Signed)
Transition of Care (TOC) - CM/SW Discharge Note ? ? ?Patient Details  ?Name: Marie House ?MRN: [id number] ?Date of Birth: May 07, 1934 ? ?Transition of Care (TOC) CM/SW Contact:  ?Kerin Salen, RN ?Phone Number: ?05/24/2021, 2:27 PM ? ? ?Clinical Narrative: To discharge home with Oxygen, arranged through [facility], order faxed [phone] confirmation received. TOC barriers resolved patient to discharge. ? ? ? ?Final next level of care: Home/Self Care ?Barriers to Discharge: Barriers Resolved ? ? ?Patient Goals and CMS Choice ?  ?  ?  ? ?Discharge Placement ?  ?           ?  ?  ?Name of family member notified: Octavia Bruckner, Son ?Patient and family notified of of transfer: 05/24/21 ? ?Discharge Plan and Services ?  ?  ?           ?DME Arranged: Oxygen ?DME Agency: [place] ?Date DME Agency Contacted: 05/24/21 ?Time DME Agency Contacted: 1110 ?Representative spoke with at DME Agency: [place] ?HH Arranged: NA ?[place] Agency: NA ?  ?  ?  ? ?Social Determinants of Health (SDOH) Interventions ?  ? ? ?Readmission Risk Interventions ?No flowsheet data found. ? ? ? ? ?

## 2021-05-24 NOTE — Progress Notes (Signed)
Patient discharged home today, transported home by family. Discharge paperwork went over with patient and family, both verbalized understanding. Belongings sent home with patient.  ?

## 2021-05-24 NOTE — Progress Notes (Signed)
?PROGRESS NOTE ? ? ? ?Marie House  SNK:[id number] DOB: [birth year] DOA: 05/21/2021 ?PCP: Marie Noble, MD  ?Chief Complaint  ?Patient presents with  ? Shortness of Breath  ? ? ?Brief Narrative:  ?87 yo with hx HTN, T2DM, CKD III who presented with SOB and was found to have pneumonia and new o2 requirement. ? ?See below for additional details   ? ? ?Assessment & Plan: ?  ?Principal Problem: ?  CAP (community acquired pneumonia) ?Active Problems: ?  Acute respiratory failure with hypoxia (Marie House) ?  Leukocytosis ?  Hyponatremia ?  CKD (chronic kidney disease) stage 4, GFR 15-29 ml/min (Marie House) ?  Type 2 diabetes mellitus (Marie House) ?  Mixed hyperlipidemia ?  Essential hypertension, benign ?  CAD (coronary artery disease) ?  Obesity (BMI 30-39.9) ? ? ?Assessment and Plan: ?* CAP (community acquired pneumonia) ?Requiring 1-2 L [place] ?86% on RA again today ?She's eager to discharge, will see about arranging home oxygen depending on her imaging and discussion with her.  I recommended she stay. ?CXR with atelectasis at [facility] base ?Will follow CT chest  ?Ceftriaxone/azithromycin 3/10-present ?Blood culture (ngtd x1), sputum culture, urine Legionella, strep pneumo and procalcitonin ?Continue Tylenol as needed ?Continue Mucinex, Robitussin, incentive spirometry, flutter valve  ? ? ? ?Acute respiratory failure with hypoxia (Marie House) ?2/2 pneumonia ?Treatment as below ? ?Leukocytosis ?Suspect 2/2 pneumonia ? ?Hyponatremia ?Mild, follow after IVF ? ? ?CKD (chronic kidney disease) stage 4, GFR 15-29 ml/min (Marie House) ?Creatinine close to baseline, follow ?Renally adjust medications, avoid nephrotoxic agents/dehydration/hypotension ? ? ?Type 2 diabetes mellitus (Marie House) ?Hemoglobin A1c on 1/22 was 6.3 ?Continue ISS and hypoglycemia protocol ?Had hypoglycemia at presentation - basaglar 55 units BID and 25 units humalog BID - likely needs adjustment in regimen going forward ? ?Mixed hyperlipidemia ?Continue statin ? ?Essential hypertension, benign ?Continue home  Coreg ?HCTZ will be held at this time due to hyponatremia ? ?CAD (coronary artery disease) ?Continue statin, nitroglycerin, Coreg ? ?Obesity (BMI 30-39.9) ?BMI 35.40, patient counseled on diet and lifestyle modification ? ? ?Awaiting med rec ? ?DVT prophylaxis: lovenox ?Code Status: full ?Family Communication: none ?Disposition:  ? ?Status is: Inpatient ?Remains inpatient appropriate because: need for o2 and IV abx ?  ?Consultants:  ?none ? ?Procedures:  ?none ? ?Antimicrobials:  ?Anti-infectives (From admission, onward)  ? ? Start     Dose/Rate Route Frequency Ordered Stop  ? 05/22/21 2200  cefTRIAXone (ROCEPHIN) 1 g in sodium chloride 0.9 % 100 mL IVPB       ? 1 g ?200 mL/hr over 30 Minutes Intravenous Every 24 hours 05/22/21 0502    ? 05/22/21 2200  azithromycin (ZITHROMAX) 500 mg in sodium chloride 0.9 % 250 mL IVPB       ? 500 mg ?250 mL/hr over 60 Minutes Intravenous Every 24 hours 05/22/21 0502    ? 05/22/21 0130  cefTRIAXone (ROCEPHIN) 2 g in sodium chloride 0.9 % 100 mL IVPB       ? 2 g ?200 mL/hr over 30 Minutes Intravenous  Once 05/22/21 0125 05/22/21 0205  ? 05/22/21 0130  azithromycin (ZITHROMAX) 500 mg in sodium chloride 0.9 % 250 mL IVPB       ? 500 mg ?250 mL/hr over 60 Minutes Intravenous  Once 05/22/21 0125 05/22/21 0309  ? ?  ? ? ?Subjective: ?Wants to go home if possible ? ?Objective: ?Vitals:  ? 05/23/21 1735 05/23/21 2300 05/24/21 0523 05/24/21 0948  ?BP: (!) 146/54 (!) 171/71 (!) 139/47 (!) 156/48  ?Pulse:  74 77 70 73  ?Resp:  (!) 21 20   ?Temp:  97.9 ?F (36.6 ?C) 98 ?F (36.7 ?C)   ?TempSrc:   Oral   ?SpO2:  95%    ?Weight:      ?Height:      ? ? ?Intake/Output Summary (Last 24 hours) at 05/24/2021 1231 ?Last data filed at 05/23/2021 1819 ?Gross per 24 hour  ?Intake 480 ml  ?Output --  ?Net 480 ml  ? ?Filed Weights  ? 05/21/21 2210  ?Weight: 96.5 kg  ? ? ?Examination: ? ?General: No acute distress. ?Cardiovascular: RRR ?Lungs: diminished ?Abdomen: Soft, nontender, nondistended  ?Neurological:  Alert and oriented ?3. Moves all extremities ?4 with equal strength. Cranial nerves II through XII grossly intact. ?Skin: Warm and dry. No rashes or lesions. ?Extremities: No clubbing or cyanosis. No edema.  ? ? ?Data Reviewed: I have personally reviewed following labs and imaging studies ? ?CBC: ?Recent Labs  ?Lab 05/21/21 ?2253 05/22/21 ?0420  ?WBC 12.4* 12.0*  ?HGB 11.3* 11.1*  ?HCT 34.9* 34.9*  ?MCV 97.5 97.2  ?PLT 202 198  ? ? ?Basic Metabolic Panel: ?Recent Labs  ?Lab 05/21/21 ?2253 05/22/21 ?0420 05/22/21 ?1140 05/23/21 ?2140 05/24/21 ?0759  ?NA 130* 130* 131*  --  134*  ?K 3.9 3.6 4.4  --  4.3  ?CL 90* 93* 93*  --  94*  ?CO2 '31 31 30  '$ --  32  ?GLUCOSE 84 50* 143* 224* 178*  ?BUN 32* 32* 30*  --  30*  ?CREATININE 1.90* 1.85* 1.77*  --  1.59*  ?CALCIUM 8.7* 8.4* 8.6*  --  8.6*  ?MG  --  2.0  --   --   --   ?PHOS  --  4.8*  --   --   --   ? ? ?GFR: ?Estimated Creatinine Clearance: 30.3 mL/min (Marie House) (by C-G formula based on SCr of 1.59 mg/dL (H)). ? ?Liver Function Tests: ?Recent Labs  ?Lab 05/21/21 ?2253 05/22/21 ?0420  ?AST 24 23  ?ALT 21 19  ?ALKPHOS 80 72  ?BILITOT 0.2* 0.3  ?PROT 6.7 6.5  ?ALBUMIN 3.6 3.4*  ? ? ?CBG: ?Recent Labs  ?Lab 05/23/21 ?1110 05/23/21 ?1621 05/23/21 ?2116 05/24/21 ?0744 05/24/21 ?1129  ?GLUCAP 224* 149* 243* 162* 264*  ? ? ? ?Recent Results (from the past 240 hour(s))  ?Culture, blood (Routine X 2) w Reflex to ID Panel     Status: None (Preliminary result)  ? Collection Time: 05/22/21  6:15 AM  ? Specimen: Right Antecubital; Blood  ?Result Value Ref Range Status  ? Specimen Description   Final  ?  RIGHT ANTECUBITAL BOTTLES DRAWN AEROBIC AND ANAEROBIC  ? Special Requests Blood Culture adequate volume  Final  ? Culture   Final  ?  NO GROWTH 2 DAYS ?Performed at [facility], [address] [place], [place] [address] ?  ? Report Status PENDING  Incomplete  ?Culture, blood (Routine X 2) w Reflex to ID Panel     Status: None (Preliminary result)  ? Collection Time: 05/22/21  6:16 AM  ?  Specimen: BLOOD RIGHT HAND  ?Result Value Ref Range Status  ? Specimen Description   Final  ?  BLOOD RIGHT HAND BOTTLES DRAWN AEROBIC AND ANAEROBIC  ? Special Requests Blood Culture adequate volume  Final  ? Culture   Final  ?  NO GROWTH 2 DAYS ?Performed at [facility], [address] [place], [place] [address] ?  ? Report Status PENDING  Incomplete  ?  ? ? ? ? ? ?  Radiology Studies: ?DG CHEST PORT 1 VIEW ? ?Result Date: 05/24/2021 ?CLINICAL DATA:  Shortness of breath.  Diabetes and hypertension. EXAM: PORTABLE CHEST 1 VIEW COMPARISON:  05/21/2021 FINDINGS: Patient's facial tissues slightly obscure the medial lung apices. Reverse lordotic positioning. Mild enlargement of the cardiopericardial silhouette, without edema. Minimal residual linear opacity at the left lung base probably from mild atelectasis. Left axillary clips noted. IMPRESSION: 1. Mild enlargement of the cardiopericardial silhouette, without edema. 2. Faint linear opacity at the left lung base favoring atelectasis. Electronically Signed   By: Van Clines M.D.   On: 05/24/2021 08:54   ? ? ? ? ? ?Scheduled Meds: ? aspirin EC  81 mg Oral Daily  ? carvedilol  12.5 mg Oral BID WC  ? dextromethorphan-guaiFENesin  1 tablet Oral BID  ? diltiazem  120 mg Oral Daily  ? enoxaparin (LOVENOX) injection  30 mg Subcutaneous Q24H  ? insulin aspart  0-5 Units Subcutaneous QHS  ? insulin aspart  0-9 Units Subcutaneous TID WC  ? pantoprazole  40 mg Oral Daily  ? pravastatin  80 mg Oral Daily  ? traZODone  50 mg Oral QHS  ? ?Continuous Infusions: ? azithromycin 500 mg (05/23/21 2325)  ? cefTRIAXone (ROCEPHIN)  IV 1 g (05/23/21 2247)  ? ? ? LOS: 2 days  ? ? ?Time spent: over 30 min ? ? ? ?Fayrene Helper, MD ?[facility] ? ? ?To contact the attending provider between 7A-7P or the covering provider during after hours 7P-7A, please log into the web site [email or web address] and access using universal [place] password for that web site. If you do not have the  password, please call the hospital operator. ? ?05/24/2021, 12:31 PM  ? ? ?

## 2021-05-24 NOTE — Discharge Summary (Signed)
Physician Discharge Summary  Marie House ZOX:[id number] DOB: 10-17-1934 DOA: 05/21/2021  PCP: Asencion Noble, MD  Admit date: 05/21/2021 Discharge date: 05/24/2021  Time spent: 40 minutes  Recommendations for Outpatient Follow-up:  Follow outpatient CBC/CMP Follow O2 requirement outpatient - needs pulmonology follow up, likely would benefit from PFT's  Follow blood sugars outpatient, may need adjustment in insulin regimen (decreased dose at discharge) Follow BP outpatient - holding HCTZ at discharge   Discharge Diagnoses:  Principal Problem:   Acute respiratory failure with hypoxia ([place]) Active Problems:   Abnormal CXR   Leukocytosis   Hyponatremia   CKD (chronic kidney disease) stage 4, GFR 15-29 ml/min (HCC)   Type 2 diabetes mellitus (HCC)   Mixed hyperlipidemia   Essential hypertension, benign   CAD (coronary artery disease)   Obesity (BMI 30-39.9)   Discharge Condition: stable  Diet recommendation: heart healthy, daibetic  Filed Weights   05/21/21 2210  Weight: 96.5 kg    History of present illness:  87 yo with hx HTN, T2DM, CKD III who presented with SOB and was found to have pneumonia and new o2 requirement.  CT scan later in admission did not show pneumonia.  Concern her new o2 requirement maybe related to undiagnosed COPD? Discharged with home o2 and pulm referral.  See below for additional details   Hospital Course:  Assessment and Plan: * Acute respiratory failure with hypoxia (HCC) CT without pneumonia ? If she has COPD with recent admission with bronchitis and reported "wheezing" prior to admission Low suspicion for DVT/PE -> normal HR.  Low risk by wells. Does have hx smoking, but she notes only Odilon Cass few years, and quit many years ago. Will refer to pulmonology for additional workup outpatient.   Abnormal CXR initially concern for pneumonia, treated with abx Requiring 1-2 L [place] 85% on RA again today -> home o2 screen, requiring 1 L at rest and with  activity Will follow CT chest -> negative for pneumonia.  Will stop antibiotics and discharge with plan for pulmonary follow up outpatient. Ceftriaxone/azithromycin 3/10-12.  Discontinue additional abx.  Blood culture (ngtd x2).   Leukocytosis Mild, follow outpatient  Hyponatremia Improved, follow outpatient Hold HCTZ at discharge   CKD (chronic kidney disease) stage 4, GFR 15-29 ml/min (HCC) Creatinine close to baseline, follow Renally adjust medications, avoid nephrotoxic agents/dehydration/hypotension   Type 2 diabetes mellitus (HCC) Hemoglobin A1c on 1/22 was 6.3 Continue ISS and hypoglycemia protocol Had hypoglycemia at presentation - basaglar 55 units BID and 25 units humalog BID (she takes 52 units basaglar BID and 15 units humalog BID per report) - will recommend she half her insulin at discharge (to 25 units basaglar BID and 7 units humalog BID)  Mixed hyperlipidemia Continue statin  Essential hypertension, benign Continue home Coreg HCTZ will be held at this time due to hyponatremia  CAD (coronary artery disease) Continue statin, nitroglycerin, Coreg  Obesity (BMI 30-39.9) BMI 35.40, patient counseled on diet and lifestyle modification   Procedures: none   Consultations: none  Discharge Exam: Vitals:   05/24/21 0948 05/24/21 1336  BP: (!) 156/48 (!) 148/51  Pulse: 73   Resp:  20  Temp:  98.2 F (36.8 C)  SpO2:  96%    See progress noted  Discharge Instructions   Discharge Instructions     Ambulatory referral to Pulmonology   Complete by: As directed    Reason for referral: Asthma/COPD   Call MD for:  difficulty breathing, headache or visual disturbances   Complete  by: As directed    Call MD for:  extreme fatigue   Complete by: As directed    Call MD for:  hives   Complete by: As directed    Call MD for:  persistant dizziness or light-headedness   Complete by: As directed    Call MD for:  persistant nausea and vomiting   Complete by:  As directed    Call MD for:  redness, tenderness, or signs of infection (pain, swelling, redness, odor or green/yellow discharge around incision site)   Complete by: As directed    Call MD for:  severe uncontrolled pain   Complete by: As directed    Call MD for:  temperature >100.4   Complete by: As directed    Diet - low sodium heart healthy   Complete by: As directed    Discharge instructions   Complete by: As directed    You were seen for concern for pneumonia.  Your CT scan did not show evidence of pneumonia.  You should follow up with the lung doctors outpatient.  Because of your earlier admission with bronchitis and the wheezing you had before this admission, I wonder if you might have COPD?  We're sending you home with oxygen to use all the time.  You need 1 L at rest and with activity.  You had pulmonary nodules on your CT scan that should be followed outpatient with your PCP.  You'll need repeat imaging.  Continue to hold your HCTZ (hydrochlorothiazide) and follow up with your PCP outpatient regarding your blood pressure.   Decrease your basal and bolus insulin.  We'll decrease your basaglar to 25 units and your humalog to 7 units twice daily with meals.    Return for new, recurrent, or worsening symptoms.  Please ask your PCP to request records from this hospitalization so they know what was done and what the next steps will be.   Increase activity slowly   Complete by: As directed       Allergies as of 05/24/2021       Reactions   Altace [ramipril]    Atacand [candesartan]    Avandia [rosiglitazone]    Crestor [rosuvastatin]    Diovan [valsartan]    Lisinopril    Lotensin [benazepril]    Norvasc [amlodipine Besylate]    Zetia [ezetimibe]         Medication List     STOP taking these medications    hydrochlorothiazide 25 MG tablet Commonly known as: HYDRODIURIL   predniSONE 20 MG tablet Commonly known as: DELTASONE       TAKE these medications     ALPRAZolam 0.5 MG tablet Commonly known as: XANAX Take 0.5 mg by mouth 3 (three) times daily as needed for anxiety.   aspirin EC 81 MG tablet Take 81 mg by mouth daily.   Basaglar KwikPen 100 UNIT/ML Inject 25 Units into the skin 2 (two) times daily. Use Cortlin Marano reduced dose for now, follow up with Dr. Willey Blade to discuss whether you need to adjust your insulin What changed:  how much to take additional instructions   benzonatate 200 MG capsule Commonly known as: TESSALON Take 200 mg by mouth 3 (three) times daily as needed for cough.   carvedilol 12.5 MG tablet Commonly known as: COREG Take 12.5 mg by mouth 2 (two) times daily with Masin Shatto meal.   CENTRUM SILVER PO Take 1 tablet by mouth daily with breakfast.   dextromethorphan-guaiFENesin 30-600 MG 12hr tablet Commonly known as: MUCINEX  DM Take 1 tablet by mouth 2 (two) times daily.   Dilt-XR 120 MG 24 hr capsule Generic drug: diltiazem Take 120 mg by mouth daily.   HumaLOG KwikPen 100 UNIT/ML KwikPen Generic drug: insulin lispro Inject 7 Units into the skin 2 (two) times daily before Alma Mohiuddin meal. Follow up with Dr. Willey Blade to see if this needs to be adjusted further. What changed:  how much to take when to take this additional instructions   HYDROcodone-acetaminophen 5-325 MG tablet Commonly known as: NORCO/VICODIN One tablet every six hours for pain.  Limit 7 days. What changed:  how much to take how to take this when to take this reasons to take this additional instructions   Ipratropium-Albuterol 20-100 MCG/ACT Aers respimat Commonly known as: COMBIVENT Inhale 1 puff into the lungs every 6 (six) hours as needed for wheezing or shortness of breath.   nitroGLYCERIN 0.4 MG SL tablet Commonly known as: NITROSTAT ONE TABLET UNDER TONGUE AS NEEDED FOR CHEST PAIN EVERY 5 MINUTES CONTACT MD AS DIRECTED What changed: See the new instructions.   omeprazole 20 MG capsule Commonly known as: PRILOSEC Take 1 capsule (20 mg total)  by mouth 2 (two) times daily before Jetaime Pinnix meal.   pravastatin 80 MG tablet Commonly known as: PRAVACHOL Take 80 mg by mouth daily.   traZODone 50 MG tablet Commonly known as: DESYREL Take 50 mg by mouth at bedtime.               Durable Medical Equipment  (From admission, onward)           Start     Ordered   05/24/21 1405  For home use only DME oxygen  Once       Comments: SATURATION QUALIFICATIONS: (This note is used to comply with regulatory documentation for home oxygen)   Patient Saturations on Room Air at Rest = 86%   Patient Saturations on Room Air while Ambulating = 87%   Patient Saturations on 1 Liters of oxygen while Ambulating = 91%   Please briefly explain why patient needs home oxygen: Patient oxygen saturation at rest was 86% at Room air. Placed on 1 liter oxygen saturation was 95% at rest. Ambulated patient in the hallway oxygen saturation was 87% on Room air, placed patient on 1 liter oxygen saturation increased to 91% at 1 liter. MD Florene Glen made aware.  Question Answer Comment  Length of Need 6 Months   Mode or (Route) Nasal cannula   Liters per Minute 1   Frequency Continuous (stationary and portable oxygen unit needed)   Oxygen conserving device Yes   Oxygen delivery system Gas      05/24/21 1404           Allergies  Allergen Reactions   Altace [Ramipril]    Atacand [Candesartan]    Avandia [Rosiglitazone]    Crestor [Rosuvastatin]    Diovan [Valsartan]    Lisinopril    Lotensin [Benazepril]    Norvasc [Amlodipine Besylate]    Zetia [Ezetimibe]       The results of significant diagnostics from this hospitalization (including imaging, microbiology, ancillary and laboratory) are listed below for reference.    Significant Diagnostic Studies: CT CHEST WO CONTRAST  Result Date: 05/24/2021 CLINICAL DATA:  Shortness of breath. Community-acquired pneumonia. Acute respiratory failure with hypoxia. EXAM: CT CHEST WITHOUT CONTRAST TECHNIQUE:  Multidetector CT imaging of the chest was performed following the standard protocol without IV contrast. RADIATION DOSE REDUCTION: This exam was performed according to the  departmental dose-optimization program which includes automated exposure control, adjustment of the mA and/or kV according to patient size and/or use of iterative reconstruction technique. COMPARISON:  Chest radiographs, most recent earlier today. No comparison CT. FINDINGS: Cardiovascular: Mild motion degradation, most apparent in the lower chest. r Aortic atherosclerosis. Tortuous thoracic aorta. Mild cardiomegaly, without pericardial effusion. Three vessel coronary artery calcification. Mediastinum/Nodes: Left axillary node dissection. Small left axillary nodes, none pathologic by size criteria. Upper normal precarinal node of 1.0 cm is likely reactive. Hilar regions poorly evaluated without intravenous contrast. Tiny hiatal hernia. Lungs/Pleura: No pleural fluid. 3 mm right lower lobe pulmonary nodule on 57/4. Left apical 3 mm pulmonary nodule on 19/4. No evidence of pneumonia. Upper Abdomen: Motion degradation continuing into the upper abdomen. Cholecystectomy. Normal imaged portions of the liver, spleen, pancreas, adrenal glands, kidneys. Right renal vascular calcification. Musculoskeletal: Left mastectomy. Osteopenia. Accentuation of expected thoracic kyphosis. IMPRESSION: 1. Mild motion degradation throughout. 2. No evidence of pneumonia or explanation for acute respiratory failure. 3. Coronary artery atherosclerosis. Aortic Atherosclerosis (ICD10-I70.0). 4. Bilateral tiny pulmonary nodules. Given history of left mastectomy/breast cancer, Fleischner criteria do not technically apply. Felt unlikely to represent metastatic disease. These could be re-evaluated with chest CT at 6-12 months if desired. Electronically Signed   By: Abigail Miyamoto M.D.   On: 05/24/2021 13:15   DG CHEST PORT 1 VIEW  Result Date: 05/24/2021 CLINICAL DATA:   Shortness of breath.  Diabetes and hypertension. EXAM: PORTABLE CHEST 1 VIEW COMPARISON:  05/21/2021 FINDINGS: Patient's facial tissues slightly obscure the medial lung apices. Reverse lordotic positioning. Mild enlargement of the cardiopericardial silhouette, without edema. Minimal residual linear opacity at the left lung base probably from mild atelectasis. Left axillary clips noted. IMPRESSION: 1. Mild enlargement of the cardiopericardial silhouette, without edema. 2. Faint linear opacity at the left lung base favoring atelectasis. Electronically Signed   By: Van Clines M.D.   On: 05/24/2021 08:54   DG Chest Portable 1 View  Result Date: 05/21/2021 CLINICAL DATA:  Shortness of breath. EXAM: PORTABLE CHEST 1 VIEW COMPARISON:  Chest x-ray 07/07/2017. FINDINGS: There is some minimal patchy opacities in the left lung base. The lungs are otherwise clear. There is no pleural effusion or pneumothorax identified. The cardiomediastinal silhouette is within normal limits. No acute fractures are seen. IMPRESSION: 1. Minimal patchy left basilar opacities may represent atelectasis or infection. Followup PA and lateral chest X-ray is recommended in 3-4 weeks following trial of antibiotic therapy to ensure resolution and exclude underlying malignancy. Electronically Signed   By: Ronney Asters M.D.   On: 05/21/2021 22:43    Microbiology: Recent Results (from the past 240 hour(s))  Culture, blood (Routine X 2) w Reflex to ID Panel     Status: None (Preliminary result)   Collection Time: 05/22/21  6:15 AM   Specimen: Right Antecubital; Blood  Result Value Ref Range Status   Specimen Description   Final    RIGHT ANTECUBITAL BOTTLES DRAWN AEROBIC AND ANAEROBIC   Special Requests Blood Culture adequate volume  Final   Culture   Final    NO GROWTH 2 DAYS Performed at [facility], [address], [place], [place] [address]    Report Status PENDING  Incomplete  Culture, blood (Routine X 2) w Reflex to ID Panel      Status: None (Preliminary result)   Collection Time: 05/22/21  6:16 AM   Specimen: BLOOD RIGHT HAND  Result Value Ref Range Status   Specimen Description   Final  BLOOD RIGHT HAND BOTTLES DRAWN AEROBIC AND ANAEROBIC   Special Requests Blood Culture adequate volume  Final   Culture   Final    NO GROWTH 2 DAYS Performed at [facility], [address] [place], [place] [address]    Report Status PENDING  Incomplete     Labs: Basic Metabolic Panel: Recent Labs  Lab 05/21/21 2253 05/22/21 0420 05/22/21 1140 05/23/21 2140 05/24/21 0759  NA 130* 130* 131*  --  134*  K 3.9 3.6 4.4  --  4.3  CL 90* 93* 93*  --  94*  CO2 '31 31 30  '$ --  32  GLUCOSE 84 50* 143* 224* 178*  BUN 32* 32* 30*  --  30*  CREATININE 1.90* 1.85* 1.77*  --  1.59*  CALCIUM 8.7* 8.4* 8.6*  --  8.6*  MG  --  2.0  --   --   --   PHOS  --  4.8*  --   --   --    Liver Function Tests: Recent Labs  Lab 05/21/21 2253 05/22/21 0420  AST 24 23  ALT 21 19  ALKPHOS 80 72  BILITOT 0.2* 0.3  PROT 6.7 6.5  ALBUMIN 3.6 3.4*   No results for input(s): LIPASE, AMYLASE in the last 168 hours. No results for input(s): AMMONIA in the last 168 hours. CBC: Recent Labs  Lab 05/21/21 2253 05/22/21 0420  WBC 12.4* 12.0*  HGB 11.3* 11.1*  HCT 34.9* 34.9*  MCV 97.5 97.2  PLT 202 198   Cardiac Enzymes: No results for input(s): CKTOTAL, CKMB, CKMBINDEX, TROPONINI in the last 168 hours. BNP: BNP (last 3 results) Recent Labs    04/05/21 1455  BNP 13.0    ProBNP (last 3 results) No results for input(s): PROBNP in the last 8760 hours.  CBG: Recent Labs  Lab 05/23/21 1110 05/23/21 1621 05/23/21 2116 05/24/21 0744 05/24/21 1129  GLUCAP 224* 149* 243* 162* 264*       Signed:  Fayrene Helper MD.  [facility] 05/24/2021, 2:09 PM

## 2021-05-24 NOTE — Progress Notes (Signed)
SATURATION QUALIFICATIONS: (This note is used to comply with regulatory documentation for home oxygen) ? ?Patient Saturations on Room Air at Rest = 86% ? ?Patient Saturations on Room Air while Ambulating = 87% ? ?Patient Saturations on 1 Liters of oxygen while Ambulating = 91% ? ?Please briefly explain why patient needs home oxygen: Patient oxygen saturation at rest was 86% at Room air. Placed on 1 liter oxygen saturation was 95% at rest. Ambulated patient in the hallway oxygen saturation was 87% on Room air, placed patient on 1 liter oxygen saturation increased to 91% at 1 liter. MD Florene Glen made aware.  ? ?

## 2021-05-27 DIAGNOSIS — N184 Chronic kidney disease, stage 4 (severe): Secondary | ICD-10-CM | POA: Diagnosis not present

## 2021-05-27 DIAGNOSIS — E1129 Type 2 diabetes mellitus with other diabetic kidney complication: Secondary | ICD-10-CM | POA: Diagnosis not present

## 2021-05-27 DIAGNOSIS — I10 Essential (primary) hypertension: Secondary | ICD-10-CM | POA: Diagnosis not present

## 2021-05-27 DIAGNOSIS — Z79899 Other long term (current) drug therapy: Secondary | ICD-10-CM | POA: Diagnosis not present

## 2021-05-28 LAB — CULTURE, BLOOD (ROUTINE X 2)
Culture: NO GROWTH
Culture: NO GROWTH
Special Requests: ADEQUATE
Special Requests: ADEQUATE

## 2021-06-02 DIAGNOSIS — E1129 Type 2 diabetes mellitus with other diabetic kidney complication: Secondary | ICD-10-CM | POA: Diagnosis not present

## 2021-06-02 DIAGNOSIS — J9601 Acute respiratory failure with hypoxia: Secondary | ICD-10-CM | POA: Diagnosis not present

## 2021-06-02 DIAGNOSIS — N183 Chronic kidney disease, stage 3 unspecified: Secondary | ICD-10-CM | POA: Diagnosis not present

## 2021-06-04 ENCOUNTER — Emergency Department (HOSPITAL_COMMUNITY)
Admission: EM | Admit: 2021-06-04 | Discharge: 2021-06-04 | Disposition: A | Discharge status: Home / Self Care | Payer: Medicare Other | Attending: Emergency Medicine | Admitting: Emergency Medicine

## 2021-06-04 ENCOUNTER — Emergency Department (HOSPITAL_COMMUNITY): Payer: Medicare Other

## 2021-06-04 DIAGNOSIS — J441 Chronic obstructive pulmonary disease with (acute) exacerbation: Secondary | ICD-10-CM | POA: Diagnosis not present

## 2021-06-04 DIAGNOSIS — Z7982 Long term (current) use of aspirin: Secondary | ICD-10-CM | POA: Diagnosis not present

## 2021-06-04 DIAGNOSIS — I10 Essential (primary) hypertension: Secondary | ICD-10-CM | POA: Diagnosis not present

## 2021-06-04 DIAGNOSIS — I700 Atherosclerosis of aorta: Secondary | ICD-10-CM | POA: Diagnosis not present

## 2021-06-04 DIAGNOSIS — Z743 Need for continuous supervision: Secondary | ICD-10-CM | POA: Diagnosis not present

## 2021-06-04 DIAGNOSIS — R0602 Shortness of breath: Secondary | ICD-10-CM | POA: Diagnosis not present

## 2021-06-04 DIAGNOSIS — Z794 Long term (current) use of insulin: Secondary | ICD-10-CM | POA: Insufficient documentation

## 2021-06-04 DIAGNOSIS — Z20822 Contact with and (suspected) exposure to covid-19: Secondary | ICD-10-CM | POA: Insufficient documentation

## 2021-06-04 DIAGNOSIS — Z87891 Personal history of nicotine dependence: Secondary | ICD-10-CM | POA: Diagnosis not present

## 2021-06-04 DIAGNOSIS — R7989 Other specified abnormal findings of blood chemistry: Secondary | ICD-10-CM | POA: Diagnosis not present

## 2021-06-04 DIAGNOSIS — R739 Hyperglycemia, unspecified: Secondary | ICD-10-CM | POA: Diagnosis not present

## 2021-06-04 DIAGNOSIS — Z79899 Other long term (current) drug therapy: Secondary | ICD-10-CM | POA: Insufficient documentation

## 2021-06-04 DIAGNOSIS — I2510 Atherosclerotic heart disease of native coronary artery without angina pectoris: Secondary | ICD-10-CM | POA: Diagnosis not present

## 2021-06-04 DIAGNOSIS — R079 Chest pain, unspecified: Secondary | ICD-10-CM | POA: Diagnosis not present

## 2021-06-04 DIAGNOSIS — R062 Wheezing: Secondary | ICD-10-CM | POA: Diagnosis not present

## 2021-06-04 DIAGNOSIS — M40204 Unspecified kyphosis, thoracic region: Secondary | ICD-10-CM | POA: Diagnosis not present

## 2021-06-04 DIAGNOSIS — R6889 Other general symptoms and signs: Secondary | ICD-10-CM | POA: Diagnosis not present

## 2021-06-04 LAB — CBC WITH DIFFERENTIAL/PLATELET
Abs Immature Granulocytes: 0.1 10*3/uL — ABNORMAL HIGH (ref 0.00–0.07)
Basophils Absolute: 0 10*3/uL (ref 0.0–0.1)
Basophils Relative: 0 %
Eosinophils Absolute: 0.3 10*3/uL (ref 0.0–0.5)
Eosinophils Relative: 2 %
HCT: 36.1 % (ref 36.0–46.0)
Hemoglobin: 11.7 g/dL — ABNORMAL LOW (ref 12.0–15.0)
Immature Granulocytes: 1 %
Lymphocytes Relative: 16 %
Lymphs Abs: 2.1 10*3/uL (ref 0.7–4.0)
MCH: 31.9 pg (ref 26.0–34.0)
MCHC: 32.4 g/dL (ref 30.0–36.0)
MCV: 98.4 fL (ref 80.0–100.0)
Monocytes Absolute: 1.1 10*3/uL — ABNORMAL HIGH (ref 0.1–1.0)
Monocytes Relative: 8 %
Neutro Abs: 9.9 10*3/uL — ABNORMAL HIGH (ref 1.7–7.7)
Neutrophils Relative %: 73 %
Platelets: 304 10*3/uL (ref 150–400)
RBC: 3.67 MIL/uL — ABNORMAL LOW (ref 3.87–5.11)
RDW: 13.3 % (ref 11.5–15.5)
WBC: 13.6 10*3/uL — ABNORMAL HIGH (ref 4.0–10.5)
nRBC: 0 % (ref 0.0–0.2)

## 2021-06-04 LAB — RESP PANEL BY RT-PCR (FLU A&B, COVID) ARPGX2
Influenza A by PCR: NEGATIVE
Influenza B by PCR: NEGATIVE
SARS Coronavirus 2 by RT PCR: NEGATIVE

## 2021-06-04 LAB — COMPREHENSIVE METABOLIC PANEL
ALT: 19 U/L (ref 0–44)
AST: 20 U/L (ref 15–41)
Albumin: 3.7 g/dL (ref 3.5–5.0)
Alkaline Phosphatase: 93 U/L (ref 38–126)
Anion gap: 9 (ref 5–15)
BUN: 22 mg/dL (ref 8–23)
CO2: 34 mmol/L — ABNORMAL HIGH (ref 22–32)
Calcium: 8.5 mg/dL — ABNORMAL LOW (ref 8.9–10.3)
Chloride: 83 mmol/L — ABNORMAL LOW (ref 98–111)
Creatinine, Ser: 1.55 mg/dL — ABNORMAL HIGH (ref 0.44–1.00)
GFR, Estimated: 33 mL/min — ABNORMAL LOW (ref >60)
Glucose, Bld: 221 mg/dL — ABNORMAL HIGH (ref 70–99)
Potassium: 4.4 mmol/L (ref 3.5–5.1)
Sodium: 126 mmol/L — ABNORMAL LOW (ref 135–145)
Total Bilirubin: 0.4 mg/dL (ref 0.3–1.2)
Total Protein: 7.3 g/dL (ref 6.5–8.1)

## 2021-06-04 LAB — BRAIN NATRIURETIC PEPTIDE: B Natriuretic Peptide: 51 pg/mL (ref 0.0–100.0)

## 2021-06-04 LAB — D-DIMER, QUANTITATIVE: D-Dimer, Quant: 0.85 ug/mL-FEU — ABNORMAL HIGH (ref 0.00–0.50)

## 2021-06-04 MED ORDER — ALBUTEROL SULFATE (2.5 MG/3ML) 0.083% IN NEBU
5.0000 mg | INHALATION_SOLUTION | Freq: Once | RESPIRATORY_TRACT | Status: AC
  Administered 2021-06-04: 5 mg via RESPIRATORY_TRACT
  Filled 2021-06-04: qty 6

## 2021-06-04 MED ORDER — PREDNISONE 20 MG PO TABS: 40.0000 mg | ORAL_TABLET | Freq: Every day | ORAL | 0 refills | Status: DC

## 2021-06-04 MED ORDER — IPRATROPIUM-ALBUTEROL 20-100 MCG/ACT IN AERS
1.0000 | INHALATION_SPRAY | Freq: Four times a day (QID) | RESPIRATORY_TRACT | 1 refills | Status: AC | PRN
Start: 2021-06-04 — End: ?

## 2021-06-04 MED ORDER — IOHEXOL 350 MG/ML SOLN
60.0000 mL | Freq: Once | INTRAVENOUS | Status: AC | PRN
  Administered 2021-06-04: 60 mL via INTRAVENOUS

## 2021-06-04 MED ORDER — SODIUM CHLORIDE 0.9 % IV BOLUS
1000.0000 mL | Freq: Once | INTRAVENOUS | Status: AC
  Administered 2021-06-04: 1000 mL via INTRAVENOUS

## 2021-06-04 NOTE — Discharge Instructions (Addendum)
Please be sure to take all medication as directed and do not hesitate to return here. ?

## 2021-06-04 NOTE — ED Triage Notes (Signed)
Pt arrives via EMS from home with SOB x1 week and wheezing. Pt given 5 mg albuterol. SOB worsened today.  ?

## 2021-06-04 NOTE — ED Provider Notes (Signed)
?[facility] ?Provider Note ? ? ?CSN: [id number] ?Arrival date & time: 06/04/21  1135 ? ?  ? ?History ? ?Chief Complaint  ?Patient presents with  ? Shortness of Breath  ? ? ?Marie House is a 87 y.o. female. ? ?HPI ?Patient presents from home with concern of dyspnea, wheezing. ?Patient is a former smoker, has no formal history of COPD.  EMS reports the patient has been short of breath for about a week with wheezing.  She improved somewhat with albuterol 5 mg.  She notes that there is pain about the xiphoid process, nonradiating.  No syncope, fever. ?  ? ?Home Medications ?Prior to Admission medications   ?Medication Sig Start Date End Date Taking? Authorizing Provider  ?ALPRAZolam (XANAX) 0.5 MG tablet Take 0.5 mg by mouth 3 (three) times daily as needed for anxiety.    [provider]  ?aspirin EC 81 MG tablet Take 81 mg by mouth daily.    [provider]  ?benzonatate (TESSALON) 200 MG capsule Take 200 mg by mouth 3 (three) times daily as needed for cough. ?Patient not taking: Reported on 05/22/2021 03/31/21   [provider]  ?carvedilol (COREG) 12.5 MG tablet Take 12.5 mg by mouth 2 (two) times daily with a meal.     [provider]  ?dextromethorphan-guaiFENesin (MUCINEX DM) 30-600 MG 12hr tablet Take 1 tablet by mouth 2 (two) times daily. 04/06/21   Barton Dubois, MD  ?DILT-XR 120 MG 24 hr capsule Take 120 mg by mouth daily. 01/08/21   [provider]  ?Cleda Clarks 100 UNIT/ML KwikPen Inject 7 Units into the skin 2 (two) times daily before a meal. Follow up with Dr. Willey Blade to see if this needs to be adjusted further. 05/24/21   Elodia Florence., MD  ?HYDROcodone-acetaminophen (NORCO/VICODIN) 5-325 MG tablet One tablet every six hours for pain.  Limit 7 days. ?Patient taking differently: Take 1 tablet by mouth every 6 (six) hours as needed for moderate pain. Limit 7 days. 04/30/21   Sanjuana Kava, MD  ?Insulin Glargine [facility]) 100  UNIT/ML Inject 25 Units into the skin 2 (two) times daily. Use a reduced dose for now, follow up with Dr. Willey Blade to discuss whether you need to adjust your insulin 05/24/21   Elodia Florence., MD  ?Ipratropium-Albuterol (COMBIVENT) 20-100 MCG/ACT AERS respimat Inhale 1 puff into the lungs every 6 (six) hours as needed for wheezing or shortness of breath. 04/06/21   Barton Dubois, MD  ?Multiple Vitamins-Minerals (CENTRUM SILVER PO) Take 1 tablet by mouth daily with breakfast.    [provider]  ?nitroGLYCERIN (NITROSTAT) 0.4 MG SL tablet ONE TABLET UNDER TONGUE AS NEEDED FOR CHEST PAIN EVERY 5 MINUTES CONTACT MD AS DIRECTED ?Patient taking differently: Place 0.4 mg under the tongue every 5 (five) minutes as needed. 12/03/16   Satira Sark, MD  ?omeprazole (PRILOSEC) 20 MG capsule Take 1 capsule (20 mg total) by mouth 2 (two) times daily before a meal. 04/06/21   Barton Dubois, MD  ?pravastatin (PRAVACHOL) 80 MG tablet Take 80 mg by mouth daily.    [provider]  ?traZODone (DESYREL) 50 MG tablet Take 50 mg by mouth at bedtime.    [provider]  ?   ? ?Allergies    ?Altace [ramipril], Atacand [candesartan], Avandia [rosiglitazone], Crestor [rosuvastatin], Diovan [valsartan], Lisinopril, Lotensin [benazepril], Norvasc [amlodipine besylate], and Zetia [ezetimibe]   ? ?Review of Systems   ?Review of Systems  ?Constitutional:   ?  Per HPI, otherwise negative  ?HENT:    ?     Per HPI, otherwise negative  ?Respiratory:    ?     Per HPI, otherwise negative  ?Cardiovascular:   ?     Per HPI, otherwise negative  ?Gastrointestinal:  Negative for vomiting.  ?Endocrine:  ?     Negative aside from HPI  ?Genitourinary:   ?     Neg aside from HPI   ?Musculoskeletal:   ?     Per HPI, otherwise negative  ?Skin: Negative.   ?Neurological:  Negative for syncope.  ? ?Physical Exam ?Updated Vital Signs ?BP (!) 131/58   Pulse 76   Temp 97.8 ?F (36.6 ?C) (Oral)   Resp 16   Ht '5\' 5"'$  (1.651 m)    Wt 96.5 kg   SpO2 93%   BMI 35.40 kg/m?  ?Physical Exam ?Vitals and nursing note reviewed.  ?Constitutional:   ?   Appearance: She is well-developed. She is obese. She is ill-appearing.  ?HENT:  ?   Head: Normocephalic and atraumatic.  ?Eyes:  ?   Conjunctiva/sclera: Conjunctivae normal.  ?Cardiovascular:  ?   Rate and Rhythm: Regular rhythm.  ?Pulmonary:  ?   Effort: Pulmonary effort is normal. Tachypnea present.  ?   Breath sounds: Decreased breath sounds present.  ?Abdominal:  ?   General: There is no distension.  ?Skin: ?   General: Skin is warm and dry.  ?Neurological:  ?   Mental Status: She is alert and oriented to person, place, and time.  ?   Cranial Nerves: No cranial nerve deficit.  ?Psychiatric:     ?   Mood and Affect: Mood normal.  ? ? ?ED Results / Procedures / Treatments   ?Labs ?(all labs ordered are listed, but only abnormal results are displayed) ?Labs Reviewed  ?COMPREHENSIVE METABOLIC PANEL - Abnormal; Notable for the following components:  ?    Result Value  ? Sodium 126 (*)   ? Chloride 83 (*)   ? CO2 34 (*)   ? Glucose, Bld 221 (*)   ? Creatinine, Ser 1.55 (*)   ? Calcium 8.5 (*)   ? GFR, Estimated 33 (*)   ? All other components within normal limits  ?CBC WITH DIFFERENTIAL/PLATELET - Abnormal; Notable for the following components:  ? WBC 13.6 (*)   ? RBC 3.67 (*)   ? Hemoglobin 11.7 (*)   ? Neutro Abs 9.9 (*)   ? Monocytes Absolute 1.1 (*)   ? Abs Immature Granulocytes 0.10 (*)   ? All other components within normal limits  ?D-DIMER, QUANTITATIVE - Abnormal; Notable for the following components:  ? D-Dimer, Quant 0.85 (*)   ? All other components within normal limits  ?RESP PANEL BY RT-PCR (FLU A&B, COVID) [id number]  ?BRAIN NATRIURETIC PEPTIDE  ? ? ?EKG ?None ? ?Radiology ?DG Chest Port 1 View ? ?Result Date: 06/04/2021 ?CLINICAL DATA:  Chest pain, shortness of breath EXAM: PORTABLE CHEST 1 VIEW COMPARISON:  05/24/2021 FINDINGS: Stable cardiomediastinal contours. Aortic atherosclerosis. No  focal airspace consolidation, pleural effusion, or pneumothorax. IMPRESSION: No acute cardiopulmonary process. Electronically Signed   By: Davina Poke D.O.   On: 06/04/2021 12:39   ? ?Procedures ?Procedures  ? ? ?Medications Ordered in ED ?Medications  ?sodium chloride 0.9 % bolus 1,000 mL (has no administration in time range)  ?albuterol (PROVENTIL) (2.5 MG/3ML) 0.083% nebulizer solution 5 mg (5 mg Nebulization Given 06/04/21 1216)  ? ? ?ED Course/ Medical Decision Making/  A&P ?This patient with a Hx of smoking, hypertension presents to the ED for concern of dyspnea, chest pain, this involves an extensive number of treatment options, and is a complaint that carries with it a high risk of complications and morbidity.   ? ?The differential diagnosis includes PE, pneumonia, ACS ? ? ?Social Determinants of Health: ? ?Obesity, age ? ?Additional history obtained: ? ?Additional history and/or information obtained from EMS, notable for details as above, improvement with albuterol in route ? ? ?After the initial evaluation, orders, including: Labs x-ray were initiated. ? ? ?Patient placed on Cardiac and Pulse-Oximetry Monitors. ?The patient was maintained on a cardiac monitor.  The cardiac monitored showed an rhythm of sinus 80s normal ?The patient was also maintained on pulse oximetry. The readings were typically 97% room air normal ? ? ?On repeat evaluation of the patient improved ? ?Lab Tests: ? ?I personally interpreted labs.  The pertinent results include: Dimer was positive even age-adjusted, prompting CT angiography.  Mild hyponatremia, hyperglycemia, CKD evidence, consistent with prior ? ?Imaging Studies ordered: ? ?I independently visualized and interpreted imaging which showed no evidence for pulmonary embolism, pneumonia ?I agree with the radiologist interpretation ? ? ? ?Dispostion / Final MDM: ? ?After consideration of the diagnostic results and the patient's response to treatment, she is appropriate for  discharge, though admission was a consideration given her age, comorbidities. ?Adult female with history of COPD, intermittent oxygen usage at home presents with 1 week of dyspnea.  Patient improved prior to a

## 2021-06-04 NOTE — ED Notes (Signed)
Pt's son called and on way to pick pt up.  ?

## 2021-06-04 NOTE — ED Notes (Signed)
Family and pt verbalized understanding of discharge paperwork, follow-up care and prescriptions.  ?

## 2021-06-09 ENCOUNTER — Encounter (HOSPITAL_COMMUNITY): Payer: Self-pay

## 2021-06-09 ENCOUNTER — Emergency Department (HOSPITAL_COMMUNITY): Payer: Medicare Other

## 2021-06-09 ENCOUNTER — Inpatient Hospital Stay (HOSPITAL_COMMUNITY)
Admission: EM | Admit: 2021-06-09 | Discharge: 2021-06-15 | DRG: 190 | Disposition: A | Discharge status: Home Health | Payer: Medicare Other | Attending: Family Medicine | Admitting: Family Medicine

## 2021-06-09 ENCOUNTER — Other Ambulatory Visit: Payer: Self-pay

## 2021-06-09 DIAGNOSIS — R9431 Abnormal electrocardiogram [ECG] [EKG]: Secondary | ICD-10-CM | POA: Diagnosis not present

## 2021-06-09 DIAGNOSIS — I10 Essential (primary) hypertension: Secondary | ICD-10-CM | POA: Diagnosis present

## 2021-06-09 DIAGNOSIS — E782 Mixed hyperlipidemia: Secondary | ICD-10-CM | POA: Diagnosis not present

## 2021-06-09 DIAGNOSIS — D72829 Elevated white blood cell count, unspecified: Secondary | ICD-10-CM | POA: Diagnosis not present

## 2021-06-09 DIAGNOSIS — E878 Other disorders of electrolyte and fluid balance, not elsewhere classified: Secondary | ICD-10-CM | POA: Diagnosis not present

## 2021-06-09 DIAGNOSIS — J9602 Acute respiratory failure with hypercapnia: Secondary | ICD-10-CM | POA: Diagnosis present

## 2021-06-09 DIAGNOSIS — Z90711 Acquired absence of uterus with remaining cervical stump: Secondary | ICD-10-CM

## 2021-06-09 DIAGNOSIS — J441 Chronic obstructive pulmonary disease with (acute) exacerbation: Principal | ICD-10-CM | POA: Diagnosis present

## 2021-06-09 DIAGNOSIS — Z87891 Personal history of nicotine dependence: Secondary | ICD-10-CM | POA: Diagnosis not present

## 2021-06-09 DIAGNOSIS — D649 Anemia, unspecified: Secondary | ICD-10-CM | POA: Diagnosis not present

## 2021-06-09 DIAGNOSIS — Z20822 Contact with and (suspected) exposure to covid-19: Secondary | ICD-10-CM | POA: Diagnosis present

## 2021-06-09 DIAGNOSIS — I2510 Atherosclerotic heart disease of native coronary artery without angina pectoris: Secondary | ICD-10-CM | POA: Diagnosis present

## 2021-06-09 DIAGNOSIS — E669 Obesity, unspecified: Secondary | ICD-10-CM | POA: Diagnosis present

## 2021-06-09 DIAGNOSIS — Z888 Allergy status to other drugs, medicaments and biological substances status: Secondary | ICD-10-CM

## 2021-06-09 DIAGNOSIS — R404 Transient alteration of awareness: Secondary | ICD-10-CM | POA: Diagnosis not present

## 2021-06-09 DIAGNOSIS — I959 Hypotension, unspecified: Secondary | ICD-10-CM | POA: Diagnosis present

## 2021-06-09 DIAGNOSIS — Z79899 Other long term (current) drug therapy: Secondary | ICD-10-CM

## 2021-06-09 DIAGNOSIS — T380X5A Adverse effect of glucocorticoids and synthetic analogues, initial encounter: Secondary | ICD-10-CM | POA: Diagnosis not present

## 2021-06-09 DIAGNOSIS — Z7189 Other specified counseling: Secondary | ICD-10-CM

## 2021-06-09 DIAGNOSIS — R109 Unspecified abdominal pain: Secondary | ICD-10-CM | POA: Diagnosis not present

## 2021-06-09 DIAGNOSIS — Z9012 Acquired absence of left breast and nipple: Secondary | ICD-10-CM | POA: Diagnosis not present

## 2021-06-09 DIAGNOSIS — K219 Gastro-esophageal reflux disease without esophagitis: Secondary | ICD-10-CM | POA: Diagnosis present

## 2021-06-09 DIAGNOSIS — G9349 Other encephalopathy: Secondary | ICD-10-CM | POA: Diagnosis not present

## 2021-06-09 DIAGNOSIS — Z6835 Body mass index (BMI) 35.0-35.9, adult: Secondary | ICD-10-CM | POA: Diagnosis not present

## 2021-06-09 DIAGNOSIS — E871 Hypo-osmolality and hyponatremia: Secondary | ICD-10-CM | POA: Diagnosis present

## 2021-06-09 DIAGNOSIS — Z7982 Long term (current) use of aspirin: Secondary | ICD-10-CM

## 2021-06-09 DIAGNOSIS — R4182 Altered mental status, unspecified: Principal | ICD-10-CM | POA: Insufficient documentation

## 2021-06-09 DIAGNOSIS — J9601 Acute respiratory failure with hypoxia: Secondary | ICD-10-CM | POA: Diagnosis present

## 2021-06-09 DIAGNOSIS — N1832 Chronic kidney disease, stage 3b: Secondary | ICD-10-CM | POA: Diagnosis not present

## 2021-06-09 DIAGNOSIS — D509 Iron deficiency anemia, unspecified: Secondary | ICD-10-CM | POA: Diagnosis present

## 2021-06-09 DIAGNOSIS — Z8249 Family history of ischemic heart disease and other diseases of the circulatory system: Secondary | ICD-10-CM

## 2021-06-09 DIAGNOSIS — J9600 Acute respiratory failure, unspecified whether with hypoxia or hypercapnia: Secondary | ICD-10-CM | POA: Diagnosis present

## 2021-06-09 DIAGNOSIS — Z7722 Contact with and (suspected) exposure to environmental tobacco smoke (acute) (chronic): Secondary | ICD-10-CM | POA: Diagnosis not present

## 2021-06-09 DIAGNOSIS — E1122 Type 2 diabetes mellitus with diabetic chronic kidney disease: Secondary | ICD-10-CM | POA: Diagnosis not present

## 2021-06-09 DIAGNOSIS — Z743 Need for continuous supervision: Secondary | ICD-10-CM | POA: Diagnosis not present

## 2021-06-09 DIAGNOSIS — Z7952 Long term (current) use of systemic steroids: Secondary | ICD-10-CM

## 2021-06-09 DIAGNOSIS — Z794 Long term (current) use of insulin: Secondary | ICD-10-CM

## 2021-06-09 DIAGNOSIS — R6889 Other general symptoms and signs: Secondary | ICD-10-CM | POA: Diagnosis not present

## 2021-06-09 DIAGNOSIS — N183 Chronic kidney disease, stage 3 unspecified: Secondary | ICD-10-CM | POA: Diagnosis present

## 2021-06-09 DIAGNOSIS — R0609 Other forms of dyspnea: Secondary | ICD-10-CM | POA: Diagnosis not present

## 2021-06-09 DIAGNOSIS — I129 Hypertensive chronic kidney disease with stage 1 through stage 4 chronic kidney disease, or unspecified chronic kidney disease: Secondary | ICD-10-CM | POA: Diagnosis present

## 2021-06-09 DIAGNOSIS — R0602 Shortness of breath: Secondary | ICD-10-CM | POA: Diagnosis not present

## 2021-06-09 DIAGNOSIS — E119 Type 2 diabetes mellitus without complications: Secondary | ICD-10-CM

## 2021-06-09 DIAGNOSIS — Z9849 Cataract extraction status, unspecified eye: Secondary | ICD-10-CM

## 2021-06-09 DIAGNOSIS — R1030 Lower abdominal pain, unspecified: Secondary | ICD-10-CM | POA: Diagnosis not present

## 2021-06-09 LAB — OSMOLALITY: Osmolality: 264 mOsm/kg — ABNORMAL LOW (ref 275–295)

## 2021-06-09 LAB — BLOOD GAS, ARTERIAL
Acid-Base Excess: 16.9 mmol/L — ABNORMAL HIGH (ref 0.0–2.0)
Bicarbonate: 45.6 mmol/L — ABNORMAL HIGH (ref 20.0–28.0)
Drawn by: 35043
FIO2: 50 %
O2 Saturation: 99.2 %
Patient temperature: 36.7
pCO2 arterial: 76 mmHg (ref 32–48)
pH, Arterial: 7.38 (ref 7.35–7.45)
pO2, Arterial: 129 mmHg — ABNORMAL HIGH (ref 83–108)

## 2021-06-09 LAB — BLOOD GAS, VENOUS
Acid-Base Excess: 18.6 mmol/L — ABNORMAL HIGH (ref 0.0–2.0)
Bicarbonate: 46.7 mmol/L — ABNORMAL HIGH (ref 20.0–28.0)
FIO2: 32 %
O2 Saturation: 83.7 %
Patient temperature: 36.6
pCO2, Ven: 71 mmHg (ref 44–60)
pH, Ven: 7.43 (ref 7.25–7.43)
pO2, Ven: 47 mmHg — ABNORMAL HIGH (ref 32–45)

## 2021-06-09 LAB — COMPREHENSIVE METABOLIC PANEL
ALT: 18 U/L (ref 0–44)
AST: 21 U/L (ref 15–41)
Albumin: 3.4 g/dL — ABNORMAL LOW (ref 3.5–5.0)
Alkaline Phosphatase: 84 U/L (ref 38–126)
Anion gap: 13 (ref 5–15)
BUN: 33 mg/dL — ABNORMAL HIGH (ref 8–23)
CO2: 37 mmol/L — ABNORMAL HIGH (ref 22–32)
Calcium: 8.4 mg/dL — ABNORMAL LOW (ref 8.9–10.3)
Chloride: 72 mmol/L — ABNORMAL LOW (ref 98–111)
Creatinine, Ser: 1.39 mg/dL — ABNORMAL HIGH (ref 0.44–1.00)
GFR, Estimated: 37 mL/min — ABNORMAL LOW (ref >60)
Glucose, Bld: 100 mg/dL — ABNORMAL HIGH (ref 70–99)
Potassium: 3.5 mmol/L (ref 3.5–5.1)
Sodium: 122 mmol/L — ABNORMAL LOW (ref 135–145)
Total Bilirubin: 0.6 mg/dL (ref 0.3–1.2)
Total Protein: 6.4 g/dL — ABNORMAL LOW (ref 6.5–8.1)

## 2021-06-09 LAB — CBC
HCT: 34.7 % — ABNORMAL LOW (ref 36.0–46.0)
Hemoglobin: 11.9 g/dL — ABNORMAL LOW (ref 12.0–15.0)
MCH: 31.6 pg (ref 26.0–34.0)
MCHC: 34.3 g/dL (ref 30.0–36.0)
MCV: 92 fL (ref 80.0–100.0)
Platelets: 355 10*3/uL (ref 150–400)
RBC: 3.77 MIL/uL — ABNORMAL LOW (ref 3.87–5.11)
RDW: 12.9 % (ref 11.5–15.5)
WBC: 18.4 10*3/uL — ABNORMAL HIGH (ref 4.0–10.5)
nRBC: 0 % (ref 0.0–0.2)

## 2021-06-09 LAB — CBG MONITORING, ED: Glucose-Capillary: 103 mg/dL — ABNORMAL HIGH (ref 70–99)

## 2021-06-09 MED ORDER — NICOTINE 21 MG/24HR TD PT24: 21.0000 mg | MEDICATED_PATCH | Freq: Every day | TRANSDERMAL | Status: DC | PRN

## 2021-06-09 MED ORDER — PREDNISONE 20 MG PO TABS: 40.0000 mg | ORAL_TABLET | Freq: Every day | ORAL | Status: DC

## 2021-06-09 MED ORDER — IPRATROPIUM-ALBUTEROL 0.5-2.5 (3) MG/3ML IN SOLN
9.0000 mL | Freq: Once | RESPIRATORY_TRACT | Status: AC
  Administered 2021-06-09: 9 mL via RESPIRATORY_TRACT
  Filled 2021-06-09: qty 9

## 2021-06-09 MED ORDER — PANTOPRAZOLE SODIUM 40 MG PO TBEC
40.0000 mg | DELAYED_RELEASE_TABLET | Freq: Every day | ORAL | Status: DC
Start: 2021-06-09 — End: 2021-06-13
  Administered 2021-06-10 – 2021-06-13 (×4): 40 mg via ORAL
  Filled 2021-06-09 (×5): qty 1

## 2021-06-09 MED ORDER — PRAVASTATIN SODIUM 40 MG PO TABS
80.0000 mg | ORAL_TABLET | Freq: Every day | ORAL | Status: DC
  Administered 2021-06-10 – 2021-06-15 (×6): 80 mg via ORAL
  Filled 2021-06-09 (×6): qty 2

## 2021-06-09 MED ORDER — HEPARIN SODIUM (PORCINE) 5000 UNIT/ML IJ SOLN
5000.0000 [IU] | Freq: Three times a day (TID) | INTRAMUSCULAR | Status: DC
  Administered 2021-06-10 – 2021-06-15 (×16): 5000 [IU] via SUBCUTANEOUS
  Filled 2021-06-09 (×16): qty 1

## 2021-06-09 MED ORDER — SODIUM CHLORIDE 0.9 % IV SOLN
2.0000 g | Freq: Two times a day (BID) | INTRAVENOUS | Status: DC
  Administered 2021-06-09 – 2021-06-10 (×2): 2 g via INTRAVENOUS
  Filled 2021-06-09 (×2): qty 2

## 2021-06-09 MED ORDER — DM-GUAIFENESIN ER 30-600 MG PO TB12
1.0000 | ORAL_TABLET | Freq: Two times a day (BID) | ORAL | Status: DC
  Administered 2021-06-10 – 2021-06-15 (×11): 1 via ORAL
  Filled 2021-06-09 (×12): qty 1

## 2021-06-09 MED ORDER — UMECLIDINIUM-VILANTEROL 62.5-25 MCG/ACT IN AEPB
1.0000 | INHALATION_SPRAY | Freq: Every day | RESPIRATORY_TRACT | Status: DC
  Administered 2021-06-10 – 2021-06-15 (×6): 1 via RESPIRATORY_TRACT
  Filled 2021-06-09 (×2): qty 14

## 2021-06-09 MED ORDER — SODIUM CHLORIDE 0.9% FLUSH
3.0000 mL | Freq: Two times a day (BID) | INTRAVENOUS | Status: DC
  Administered 2021-06-10 – 2021-06-15 (×11): 3 mL via INTRAVENOUS

## 2021-06-09 MED ORDER — ASPIRIN EC 81 MG PO TBEC
81.0000 mg | DELAYED_RELEASE_TABLET | Freq: Every day | ORAL | Status: DC
  Administered 2021-06-10 – 2021-06-15 (×6): 81 mg via ORAL
  Filled 2021-06-09 (×6): qty 1

## 2021-06-09 MED ORDER — BUDESONIDE 0.5 MG/2ML IN SUSP
2.0000 mg | Freq: Two times a day (BID) | RESPIRATORY_TRACT | Status: DC
  Administered 2021-06-09 – 2021-06-10 (×3): 2 mg via RESPIRATORY_TRACT
  Filled 2021-06-09 (×3): qty 8

## 2021-06-09 MED ORDER — CARVEDILOL 12.5 MG PO TABS
12.5000 mg | ORAL_TABLET | Freq: Two times a day (BID) | ORAL | Status: DC
  Administered 2021-06-10 – 2021-06-15 (×11): 12.5 mg via ORAL
  Filled 2021-06-09 (×11): qty 1

## 2021-06-09 MED ORDER — METHYLPREDNISOLONE SODIUM SUCC 125 MG IJ SOLR
125.0000 mg | Freq: Once | INTRAMUSCULAR | Status: AC
  Administered 2021-06-09: 125 mg via INTRAVENOUS
  Filled 2021-06-09: qty 2

## 2021-06-09 NOTE — ED Provider Notes (Signed)
?[facility] ?Provider Note ? ?CSN: [id number] ?Arrival date & time: 06/09/21 1549 ? ?Chief Complaint(s) ?Altered Mental Status ? ?HPI ?Marie House is a 87 y.o. female with PMH CKD, HTN, COPD, T2DM who presents emergency department for evaluation of altered mental status.  Patient seen on 06/04/2021 with negative CT PE imaging and ultimately discharged on steroids for suspected COPD exacerbation.  Husband states that since discharge from the emergency department, patient has gotten progressively more confused and wheezing has worsened.  He states that patient's shortness of breath is worsened and patient arrives saturating 88 to 91% on room air.  Patient unable to provide any history or further review of systems due to altered mental status. ? ? ?Altered Mental Status ? ?Past Medical History ?Past Medical History:  ?Diagnosis Date  ? Arthritis   ? Carotid artery disease ([place])   ? CKD (chronic kidney disease) stage 3, GFR 30-59 ml/min (HCC)   ? Coronary atherosclerosis of native coronary artery   ? [place]her DES LAD 2003, [place] DES LAD 2004  ? Essential hypertension, benign   ? Mixed hyperlipidemia   ? Type 2 diabetes mellitus ([place])   ? ?Patient Active Problem List  ? Diagnosis Date Noted  ? Abnormal CXR 05/22/2021  ? Obesity (BMI 30-39.9) 05/22/2021  ? Leukocytosis 05/22/2021  ? Hyponatremia 05/22/2021  ? CKD (chronic kidney disease) stage 4, GFR 15-29 ml/min (HCC) 05/22/2021  ? Acute respiratory failure with hypoxia ([place])   ? Wheezing   ? Acute bronchitis 04/05/2021  ? AKI (acute kidney injury) ([place]) 04/05/2021  ? Exudative age-related macular degeneration of right eye with inactive choroidal neovascularization ([place]) 08/30/2019  ? Stable branch retinal vein occlusion of right eye 08/30/2019  ? Advanced nonexudative age-related macular degeneration of both eyes with subfoveal involvement 08/30/2019  ? 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 IDA (iron deficiency anemia) 04/16/2019  ? GERD  (gastroesophageal reflux disease) 04/16/2019  ? CAD (coronary artery disease) 06/07/2013  ? Carotid artery occlusion 06/07/2013  ? CKD (chronic kidney disease) stage 3, GFR 30-59 ml/min (HCC) 06/06/2013  ? Type 2 diabetes mellitus ([place]) 06/06/2013  ? Mixed hyperlipidemia 06/06/2013  ? Essential hypertension, benign 06/06/2013  ? ?Home Medication(s) ?Prior to Admission medications   ?Medication Sig Start Date End Date Taking? Authorizing Provider  ?ALPRAZolam (XANAX) 0.5 MG tablet Take 0.5 mg by mouth 3 (three) times daily as needed for anxiety.    [provider]  ?aspirin EC 81 MG tablet Take 81 mg by mouth daily.    [provider]  ?benzonatate (TESSALON) 200 MG capsule Take 200 mg by mouth 3 (three) times daily as needed for cough. ?Patient not taking: Reported on 05/22/2021 03/31/21   [provider]  ?carvedilol (COREG) 12.5 MG tablet Take 12.5 mg by mouth 2 (two) times daily with a meal.     [provider]  ?dextromethorphan-guaiFENesin (MUCINEX DM) 30-600 MG 12hr tablet Take 1 tablet by mouth 2 (two) times daily. 04/06/21   Barton Dubois, MD  ?DILT-XR 120 MG 24 hr capsule Take 120 mg by mouth daily. ?Patient not taking: Reported on 06/04/2021 01/08/21   [provider]  ?Cleda Clarks 100 UNIT/ML KwikPen Inject 7 Units into the skin 2 (two) times daily before a meal. Follow up with Dr. Willey Blade to see if this needs to be adjusted further. 05/24/21   Elodia Florence., MD  ?hydrALAZINE (APRESOLINE) 10 MG tablet Take 10 mg by mouth 2 (two) times daily. 04/28/21  [provider]  ?hydrochlorothiazide (HYDRODIURIL) 25 MG tablet Take 25 mg by mouth daily. 05/25/21   [provider]  ?HYDROcodone-acetaminophen (NORCO/VICODIN) 5-325 MG tablet One tablet every six hours for pain.  Limit 7 days. ?Patient not taking: Reported on 06/04/2021 04/30/21   Sanjuana Kava, MD  ?Insulin Glargine [facility]) 100 UNIT/ML Inject 25 Units into the skin 2 (two)  times daily. Use a reduced dose for now, follow up with Dr. Willey Blade to discuss whether you need to adjust your insulin 05/24/21   Elodia Florence., MD  ?Ipratropium-Albuterol (COMBIVENT) 20-100 MCG/ACT AERS respimat Inhale 1 puff into the lungs every 6 (six) hours as needed for wheezing or shortness of breath. 06/04/21   Carmin Muskrat, MD  ?Multiple Vitamins-Minerals (CENTRUM SILVER PO) Take 1 tablet by mouth daily with breakfast.    [provider]  ?nitroGLYCERIN (NITROSTAT) 0.4 MG SL tablet ONE TABLET UNDER TONGUE AS NEEDED FOR CHEST PAIN EVERY 5 MINUTES CONTACT MD AS DIRECTED ?Patient taking differently: Place 0.4 mg under the tongue every 5 (five) minutes as needed. 12/03/16   Satira Sark, MD  ?omeprazole (PRILOSEC) 20 MG capsule Take 1 capsule (20 mg total) by mouth 2 (two) times daily before a meal. 04/06/21   Barton Dubois, MD  ?pravastatin (PRAVACHOL) 80 MG tablet Take 80 mg by mouth daily.    [provider]  ?predniSONE (DELTASONE) 20 MG tablet Take 2 tablets (40 mg total) by mouth daily with breakfast. For the next four days 06/04/21   Carmin Muskrat, MD  ?traZODone (DESYREL) 50 MG tablet Take 50 mg by mouth at bedtime.    [provider]  ?                                                                                                                                  ?Past Surgical History ?Past Surgical History:  ?Procedure Laterality Date  ? CAROTID ENDARTERECTOMY Right 2004  ? CATARACT EXTRACTION    ? CHOLECYSTECTOMY    ? Left breast mastectomy    ? 25 yrs ago  ? MASTECTOMY Left   ? PARTIAL HYSTERECTOMY    ? TONSILLECTOMY    ? ?Family History ?Family History  ?Problem Relation Age of Onset  ? CAD Mother   ? ? ?Social History ?Social History  ? ?Tobacco Use  ? Smoking status: Former  ?  Types: Cigarettes  ?  Quit date: 03/15/2006  ?  Years since quitting: 15.2  ? Smokeless tobacco: Never  ?Substance Use Topics  ? Alcohol use: No  ?  Alcohol/week: 0.0 standard  drinks  ? Drug use: No  ? ?Allergies ?Altace [ramipril], Atacand [candesartan], Avandia [rosiglitazone], Crestor [rosuvastatin], Diovan [valsartan], Lisinopril, Lotensin [benazepril], Norvasc [amlodipine besylate], and Zetia [ezetimibe] ? ?Review of Systems ?Review of Systems  ?Unable to perform ROS: Mental status change  ? ?Physical Exam ?Vital Signs  ?I have reviewed the triage vital signs ?BP Marland Kitchen)  118/51   Pulse (!) 59   Temp 98 ?F (36.7 ?C) (Oral)   Resp 14   Ht '5\' 5"'$  (1.651 m)   Wt 96.5 kg   SpO2 99%   BMI 35.40 kg/m?  ? ?Physical Exam ?Vitals and nursing note reviewed.  ?Constitutional:   ?   General: She is in acute distress.  ?   Appearance: She is well-developed. She is ill-appearing.  ?HENT:  ?   Head: Normocephalic and atraumatic.  ?Eyes:  ?   Conjunctiva/sclera: Conjunctivae normal.  ?Cardiovascular:  ?   Rate and Rhythm: Normal rate and regular rhythm.  ?   Heart sounds: No murmur heard. ?Pulmonary:  ?   Effort: Respiratory distress present.  ?   Breath sounds: Wheezing present.  ?Abdominal:  ?   Palpations: Abdomen is soft.  ?   Tenderness: There is no abdominal tenderness.  ?Musculoskeletal:     ?   General: No swelling.  ?   Cervical back: Neck supple.  ?Skin: ?   General: Skin is warm and dry.  ?   Capillary Refill: Capillary refill takes less than 2 seconds.  ?Neurological:  ?   Mental Status: She is alert. She is disoriented.  ?Psychiatric:     ?   Mood and Affect: Mood normal.  ? ? ?ED Results and Treatments ?Labs ?(all labs ordered are listed, but only abnormal results are displayed) ?Labs Reviewed  ?COMPREHENSIVE METABOLIC PANEL - Abnormal; Notable for the following components:  ?    Result Value  ? Sodium 122 (*)   ? Chloride 72 (*)   ? CO2 37 (*)   ? Glucose, Bld 100 (*)   ? BUN 33 (*)   ? Creatinine, Ser 1.39 (*)   ? Calcium 8.4 (*)   ? Total Protein 6.4 (*)   ? Albumin 3.4 (*)   ? GFR, Estimated 37 (*)   ? All other components within normal limits  ?CBC - Abnormal; Notable for the  following components:  ? WBC 18.4 (*)   ? RBC 3.77 (*)   ? Hemoglobin 11.9 (*)   ? HCT 34.7 (*)   ? All other components within normal limits  ?BLOOD GAS, VENOUS - Abnormal; Notable for the following components:  ? pCO2, Ven 71 (*

## 2021-06-09 NOTE — ED Notes (Signed)
Waiting on RT to transport pt to ICU ?

## 2021-06-09 NOTE — Assessment & Plan Note (Addendum)
Patient unable to participate in meaningful discussion regarding CODE STATUS given her altered mental status/delirium.  She has no advanced directive on file.  Spoke at length with patient's son, Marie House.  He confirmed that he is her only child, she was married but her husband is deceased.  I updated Mr. Marie House on Marie House's current condition, plan of care, prognosis is fair to guarded and she is at risk of further decompensation, but she is not critical or terminal at this time.  Mr. Marie House confirmed that patient does not have formal COPD diagnosis or other major respiratory risk factors known.  He states that she would not want to pursue CPR if her heart were to stop, he states she would be amenable to intubation/mechanical ventilation if her respiratory status worsens.  Patient is limited code, orders updated to include no CPR, defibrillation, ACLS medications however invasive mechanical ventilation would be acceptable.  Recommend completion of advance directive prior to discharge home  ?

## 2021-06-09 NOTE — Assessment & Plan Note (Signed)
Likely d/t recent steroids ?? Monitor routine CBC ?? Checking procalcitonin, UA - nothing to suggest overt bacterial infection at this time would be contributing to AMS ?

## 2021-06-09 NOTE — Assessment & Plan Note (Addendum)
Likely contirbuting to altered mental status ?No apparent forma diagnosis COPD, was referred to pulmonary after last admission but has not gone yet, son states she has upcoming appointment scheduled 06/26/2021, son states she smoked cigarettes a long time ago, did have some secondhand smoke exposure, no other respiratory occupational exposures that he is aware of ?Recent ER visit treated for COPD exacerbation, failed outpatient treatment  ?? In ED 06/09/21 received DuoNeb x3, SoluMedrol 125 mg, placed on BiPap, minimal improvement in mental status ?? Continue BiPap, repeat blood gas later this evening / tomorrow AM ?? Continue DuoNeb q2-4 hours ?? Respiratory PCR pending, recent negative for COVID/flu ?? Will add antibiotics given severity of respiratory status and recent admission for similar symptoms, may be able to de-escalate if viral cause is noted on respiratory PCR ?? Consider pulmonary consult while inpatient given readmission risk and unclear COPD diagnosis vs other  ?

## 2021-06-09 NOTE — Assessment & Plan Note (Signed)
Chronic, unclear if patient still taking HCTZ at home, this was held on her last hospital discharge  ?? Hold HCTZ ?? Add on Serum Osm, Urine Osm/Sodium ?? Given chronic will not pursue aggressive correction until further workup completed  ?

## 2021-06-09 NOTE — ED Triage Notes (Signed)
Pt brought to ED via [facility]. EMS called out for abdominal pain and unconscious. O2 upon arrival 86%. Pt recently discharged from hospital, pt has been altered since discharge. Pt will respond to voice. Pt Capnography as high as 60 per EMS. 20g L hand  ?

## 2021-06-09 NOTE — Assessment & Plan Note (Signed)
Stable Cr/GFR ?? Hold nephrotoxic rx as able  ?

## 2021-06-09 NOTE — Progress Notes (Signed)
O2 decreased to 40%, pt spo2 100% ?

## 2021-06-09 NOTE — Assessment & Plan Note (Signed)
Continue PPI ?

## 2021-06-09 NOTE — ED Notes (Signed)
Date and time results received: 06/09/21 1700 ?(use smartphrase ".now" to insert current time) ? ?Test: Pco2  ?Critical Value: 71  ? ?Name of Provider Notified: CK ? ?Orders Received? Or Actions Taken?:  ?

## 2021-06-09 NOTE — Assessment & Plan Note (Signed)
History of hypoglycemia, Glc on admission 100  ?? holding home meds for now ?? Basal insulin + SSI while inpatient ? ?

## 2021-06-09 NOTE — H&P (Deleted)
Error

## 2021-06-09 NOTE — Assessment & Plan Note (Signed)
-   Continue home statin °

## 2021-06-09 NOTE — Progress Notes (Signed)
PHARMACY NOTE:  ANTIMICROBIAL RENAL DOSAGE ADJUSTMENT ? ?Current antimicrobial regimen includes a mismatch between antimicrobial dosage and estimated renal function.  As per policy approved by the Pharmacy & Therapeutics and Medical Executive Committees, the antimicrobial dosage will be adjusted accordingly. ? ?Current antimicrobial dosage:   ? Cefepime 2 gm IV q8h x 5 days ? ?Indication: COPD exacerbation ? ?Renal Function: ? ?Estimated Creatinine Clearance: 34.6 mL/min (A) (by C-G formula based on SCr of 1.39 mg/dL (H)). ?'[]'$      On intermittent HD, scheduled: ?'[]'$      On CRRT ?   ?Antimicrobial dosage has been changed to:   ? Cefepime 2gm IV q12h x 5 days ? ?Thank you for allowing pharmacy to be a part of this patient's care. ? ?Arty Baumgartner, [place] ?06/09/2021 6:18 PM ?

## 2021-06-09 NOTE — H&P (Addendum)
HISTORY AND PHYSICAL ?  ?Patient: Marie House 87 y.o. female ?MRN: [id number] ?  ?Today is hospital day 0 after presenting to ED on 06/09/2021  3:49 PM with  ?   ?Chief Complaint  ?Patient presents with  ? Altered Mental Status  ?  ?  ?  ?RECORD REVIEW AND HOSPITAL COURSE: ?Patient presented to the emergency department 06/09/2021 from home via EMS, chief complaint altered mental status and respiratory distress with wheezing and shortness of breath, patient arrived in ED saturating 88 to 91% on room air.   ?ED course today 06/09/2021: Blood gas showed elevated PCO2 to 71, chest x-ray showed no acute processes or obvious infiltrate, sodium 122, creatinine around baseline at 1.3 and GFR 37, glucose 100, white blood cells elevated to 18, hemoglobin 11.9, stable from previous.  Patient received DuoNeb x2, Solu-Medrol, was placed on BiPAP. ?Other relevant recent records reviewed ?Patient recently seen in ED 06/04/2021 and treated for COPD exacerbation CT PE imaging was negative at that time ?Patient was also hospitalized 05/21/2021 to 05/24/2021 for similar respiratory distress/acute respiratory failure, hyponatremia ?  ?  ?SUBJECTIVE:  ?Patient unable to participate in interview, she is seen and examined in emergency department resting comfortably in bed, BiPAP in place.  She is rousable to pain but is not conversational.  Most of history is obtained from her son over the phone.  He confirms that she has been having worsening trouble breathing, increased confusion/lethargy.  Has not had any fall, head trauma.  No sick contacts he is aware of.  Former smoker, some secondhand smoke exposure but he does not think too significant, he states no known occupational respiratory exposure to dust/fumes. ?  ?  ?  ?  ?ASSESSMENT & PLAN ?  ?Acute respiratory failure with hypoxia and hypercarbia (HCC) likely due to COPD, and is contirbuting to altered mental status / lethargy ?Likely contirbuting to altered mental status ?No apparent forma  diagnosis COPD, was referred to pulmonary after last admission but has not gone yet, son states she has upcoming appointment scheduled 06/26/2021, son states she smoked cigarettes a long time ago, did have some secondhand smoke exposure, no other respiratory occupational exposures that he is aware of ?Recent ER visit treated for COPD exacerbation, failed outpatient treatment  ?In ED 06/09/21 received DuoNeb x3, SoluMedrol 125 mg, placed on BiPap, minimal improvement in mental status ?Continue BiPap, repeat blood gas later this evening / tomorrow AM ?Continue DuoNeb q2-4 hours ?Respiratory PCR pending, recent negative for COVID/flu ?Will add antibiotics given severity of respiratory status and recent admission for similar symptoms, may be able to de-escalate if viral cause is noted on respiratory PCR ?Consider pulmonary consult while inpatient given readmission risk and unclear COPD diagnosis vs other  ?  ?ADDENDUM 06/09/21 10:42 PM  ?Patient initially seen in ED approx 17:45 and very somnolent at that time, responsive only to pain. Reexamined 22:30 and improved, wakes up to voice, speaking, only complaint at this time is feels like she needs to have a bowel movement. Hopefully will continue to improve overnight on BiPap.  ? ? ?Hyponatremia ?Chronic, unclear if patient still taking HCTZ at home, this was held on her last hospital discharge  ?Hold HCTZ ?Add on Serum Osm, Urine Osm/Sodium ?Given chronic will not pursue aggressive correction until further workup completed  ?  ?Type 2 diabetes mellitus ([place]) ?History of hypoglycemia, Glc on admission 100  ?holding home meds for now ?Basal insulin + SSI while inpatient ?  ?  ?Essential  hypertension, benign ?BP fluctuating in ED, unknown if adherent to home med regimen ?Hold home meds for now ?Gentle IV fluids, no hx CHF and no clinical fluid overload ?  ?CKD (chronic kidney disease) stage 3, GFR 30-59 ml/min (HCC) ?Stable Cr/GFR ?Hold nephrotoxic rx as able  ?  ?Mixed  hyperlipidemia ?Continue home statin ?  ?CAD (coronary artery disease) ?Continue home beta blocker, statin, aspirin  ?Holding ACE/ARB and other BP meds d/t CKD and episodic hypotension ?  ?IDA (iron deficiency anemia) ?Chronic, stable ?Monitor routine CBC ?  ?GERD (gastroesophageal reflux disease) ?Continue PPI ?  ?Leukocytosis ?Likely d/t recent steroids ?Monitor routine CBC ?Checking procalcitonin, UA - nothing to suggest overt bacterial infection at this time would be contributing to AMS ?  ?Advance care planning ?Patient unable to participate in meaningful discussion regarding CODE STATUS given her altered mental status/delirium.  She has no advanced directive on file.  Spoke at length with patient's son, Marie House.  He confirmed that he is her only child, she was married but her husband is deceased.  I updated Mr. Marie House on Ms. Marie House's current condition, plan of care, prognosis is fair to guarded and she is at risk of further decompensation, but she is not critical or terminal at this time.  Mr. Marie House confirmed that patient does not have formal COPD diagnosis or other major respiratory risk factors known.  He states that she would not want to pursue CPR if her heart were to stop, he states she would be amenable to intubation/mechanical ventilation if her respiratory status worsens.  Patient is limited code, orders updated to include no CPR, defibrillation, ACLS medications however invasive mechanical ventilation would be acceptable.  Recommend completion of advance directive prior to discharge home  ?  ?  ?  ?VTE Ppx: heparin ?CODE STATUS: LIMITED - INTUBATION OK, CPR/DEFIBRILLATION/RX DECLINED - SEE A/P ?Admitted from: home ?Expected Dispo: TBD ?Barriers to discharge: continued medical workup  ?Family communication: phone conversation w/ son Marie House  ?  ?  ?  ?  ?  ?  ?  ?  ?  ?  ?  ?  ?  ?    ?Past Medical History:  ?Diagnosis Date  ? Arthritis    ? Carotid artery disease ([place])    ? CKD (chronic  kidney disease) stage 3, GFR 30-59 ml/min (HCC)    ? Coronary atherosclerosis of native coronary artery    ?  Cypher DES LAD 2003, Taxus DES LAD 2004  ? Essential hypertension, benign    ? Mixed hyperlipidemia    ? Type 2 diabetes mellitus ([place])    ?  ?  ?     ?Past Surgical History:  ?Procedure Laterality Date  ? CAROTID ENDARTERECTOMY Right 2004  ? CATARACT EXTRACTION      ? CHOLECYSTECTOMY      ? Left breast mastectomy      ?  25 yrs ago  ? MASTECTOMY Left    ? PARTIAL HYSTERECTOMY      ? TONSILLECTOMY      ?  ?  ?     ?Family History  ?Problem Relation Age of Onset  ? CAD Mother    ?  ?Social History:  reports that she quit smoking about 15 years ago. Her smoking use included cigarettes. She has never used smokeless tobacco. She reports that she does not drink alcohol and does not use drugs. ?  ?Allergies:  ?    ?Allergies  ?Allergen Reactions  ?  Altace [Ramipril]    ? Atacand [Candesartan]    ? Avandia [Rosiglitazone]    ? Crestor [Rosuvastatin]    ? Diovan [Valsartan]    ? Lisinopril    ? Lotensin [Benazepril]    ? Norvasc [Amlodipine Besylate]    ? Zetia [Ezetimibe]    ?  ?  ?No current facility-administered medications on file prior to encounter.  ?  ?      ?Current Outpatient Medications on File Prior to Encounter  ?Medication Sig Dispense Refill  ? ALPRAZolam (XANAX) 0.5 MG tablet Take 0.5 mg by mouth 3 (three) times daily as needed for anxiety.      ? aspirin EC 81 MG tablet Take 81 mg by mouth daily.      ? benzonatate (TESSALON) 200 MG capsule Take 200 mg by mouth 3 (three) times daily as needed for cough. (Patient not taking: Reported on 05/22/2021)      ? carvedilol (COREG) 12.5 MG tablet Take 12.5 mg by mouth 2 (two) times daily with a meal.       ? dextromethorphan-guaiFENesin (MUCINEX DM) 30-600 MG 12hr tablet Take 1 tablet by mouth 2 (two) times daily. 20 tablet 0  ? DILT-XR 120 MG 24 hr capsule Take 120 mg by mouth daily. (Patient not taking: Reported on 06/04/2021)      ? HUMALOG KWIKPEN 100  UNIT/ML KwikPen Inject 7 Units into the skin 2 (two) times daily before a meal. Follow up with Dr. Willey Blade to see if this needs to be adjusted further. 15 mL 11  ? hydrALAZINE (APRESOLINE) 10 MG tablet Take 10 mg by

## 2021-06-09 NOTE — Assessment & Plan Note (Signed)
BP fluctuating in ED, unknown if adherent to home med regimen ?? Hold home meds for now ?? Gentle IV fluids, no hx CHF and no clinical fluid overload ?

## 2021-06-09 NOTE — Progress Notes (Addendum)
Moved patient from ED 14 to ICU 8 without incident.  ICU Staff in patient's room at this time.  ICU RT notified. ?

## 2021-06-09 NOTE — Assessment & Plan Note (Signed)
Chronic, stable ?? Monitor routine CBC ?

## 2021-06-09 NOTE — Assessment & Plan Note (Signed)
?   Continue home beta blocker, statin, aspirin  ?? Holding ACE/ARB and other BP meds d/t CKD and episodic hypotension ?

## 2021-06-10 ENCOUNTER — Inpatient Hospital Stay (HOSPITAL_COMMUNITY): Payer: Medicare Other

## 2021-06-10 DIAGNOSIS — J9601 Acute respiratory failure with hypoxia: Secondary | ICD-10-CM

## 2021-06-10 DIAGNOSIS — R0609 Other forms of dyspnea: Secondary | ICD-10-CM

## 2021-06-10 DIAGNOSIS — J9602 Acute respiratory failure with hypercapnia: Secondary | ICD-10-CM | POA: Diagnosis not present

## 2021-06-10 LAB — ECHOCARDIOGRAM COMPLETE
AR max vel: 2.31 cm2
AV Area VTI: 2.5 cm2
AV Area mean vel: 2.29 cm2
AV Mean grad: 3 mmHg
AV Peak grad: 6.1 mmHg
Ao pk vel: 1.23 m/s
Area-P 1/2: 1.83 cm2
Calc EF: 75.3 %
Height: 65 in
MV VTI: 1.64 cm2
S' Lateral: 2.3 cm
Single Plane A2C EF: 74.7 %
Single Plane A4C EF: 75.1 %
Weight: 3407.43 oz

## 2021-06-10 LAB — MRSA NEXT GEN BY PCR, NASAL: MRSA by PCR Next Gen: NOT DETECTED

## 2021-06-10 LAB — RESPIRATORY PANEL BY PCR

## 2021-06-10 LAB — CBC
HCT: 36.4 % (ref 36.0–46.0)
Hemoglobin: 12.1 g/dL (ref 12.0–15.0)
MCH: 31.2 pg (ref 26.0–34.0)
MCHC: 33.2 g/dL (ref 30.0–36.0)
MCV: 93.8 fL (ref 80.0–100.0)
Platelets: 325 10*3/uL (ref 150–400)
RBC: 3.88 MIL/uL (ref 3.87–5.11)
RDW: 13 % (ref 11.5–15.5)
WBC: 14.6 10*3/uL — ABNORMAL HIGH (ref 4.0–10.5)
nRBC: 0 % (ref 0.0–0.2)

## 2021-06-10 LAB — BASIC METABOLIC PANEL
Anion gap: 12 (ref 5–15)
BUN: 38 mg/dL — ABNORMAL HIGH (ref 8–23)
CO2: 34 mmol/L — ABNORMAL HIGH (ref 22–32)
Calcium: 8.6 mg/dL — ABNORMAL LOW (ref 8.9–10.3)
Chloride: 78 mmol/L — ABNORMAL LOW (ref 98–111)
Creatinine, Ser: 1.43 mg/dL — ABNORMAL HIGH (ref 0.44–1.00)
GFR, Estimated: 36 mL/min — ABNORMAL LOW (ref >60)
Glucose, Bld: 182 mg/dL — ABNORMAL HIGH (ref 70–99)
Potassium: 4.1 mmol/L (ref 3.5–5.1)
Sodium: 124 mmol/L — ABNORMAL LOW (ref 135–145)

## 2021-06-10 LAB — PROCALCITONIN: Procalcitonin: <0.1 ng/mL

## 2021-06-10 MED ORDER — IPRATROPIUM-ALBUTEROL 0.5-2.5 (3) MG/3ML IN SOLN
3.0000 mL | Freq: Four times a day (QID) | RESPIRATORY_TRACT | Status: DC
  Administered 2021-06-10 (×3): 3 mL via RESPIRATORY_TRACT
  Filled 2021-06-10 (×3): qty 3

## 2021-06-10 MED ORDER — METHYLPREDNISOLONE SODIUM SUCC 40 MG IJ SOLR: 40.0000 mg | Freq: Two times a day (BID) | INTRAMUSCULAR | Status: DC

## 2021-06-10 MED ORDER — ENSURE ENLIVE PO LIQD
237.0000 mL | Freq: Two times a day (BID) | ORAL | Status: DC
  Administered 2021-06-10 – 2021-06-15 (×8): 237 mL via ORAL

## 2021-06-10 MED ORDER — PREDNISONE 20 MG PO TABS
40.0000 mg | ORAL_TABLET | Freq: Every day | ORAL | Status: DC
  Administered 2021-06-10 – 2021-06-13 (×4): 40 mg via ORAL
  Filled 2021-06-10 (×4): qty 2

## 2021-06-10 MED ORDER — CHLORHEXIDINE GLUCONATE CLOTH 2 % EX PADS: 6.0000 | MEDICATED_PAD | Freq: Every day | CUTANEOUS | Status: DC

## 2021-06-10 MED ORDER — BUDESONIDE 0.5 MG/2ML IN SUSP
0.5000 mg | Freq: Two times a day (BID) | RESPIRATORY_TRACT | Status: DC
  Administered 2021-06-11 – 2021-06-15 (×9): 0.5 mg via RESPIRATORY_TRACT
  Filled 2021-06-10 (×9): qty 2

## 2021-06-10 MED ORDER — IPRATROPIUM-ALBUTEROL 0.5-2.5 (3) MG/3ML IN SOLN
3.0000 mL | Freq: Three times a day (TID) | RESPIRATORY_TRACT | Status: DC
  Administered 2021-06-11: 3 mL via RESPIRATORY_TRACT
  Filled 2021-06-10: qty 3

## 2021-06-10 MED ORDER — ONDANSETRON HCL 4 MG/2ML IJ SOLN
4.0000 mg | Freq: Four times a day (QID) | INTRAMUSCULAR | Status: DC | PRN
Start: 2021-06-10 — End: 2021-06-15
  Administered 2021-06-10 – 2021-06-14 (×3): 4 mg via INTRAVENOUS
  Filled 2021-06-10 (×3): qty 2

## 2021-06-10 MED ORDER — IPRATROPIUM-ALBUTEROL 0.5-2.5 (3) MG/3ML IN SOLN
3.0000 mL | RESPIRATORY_TRACT | Status: DC | PRN
  Administered 2021-06-11 – 2021-06-13 (×2): 3 mL via RESPIRATORY_TRACT
  Filled 2021-06-10: qty 3

## 2021-06-10 NOTE — Plan of Care (Signed)
?  Problem: Acute Rehab PT Goals(only PT should resolve) ?Goal: Pt Will Go Supine/Side To Sit ?Outcome: Progressing ?Flowsheets (Taken 06/10/2021 1431) ?Pt will go Supine/Side to Sit: with minimal assist ?Goal: Patient Will Transfer Sit To/From Stand ?Outcome: Progressing ?Flowsheets (Taken 06/10/2021 1431) ?Patient will transfer sit to/from stand: with minimal assist ?Goal: Pt Will Transfer Bed To Chair/Chair To Bed ?Outcome: Progressing ?Flowsheets (Taken 06/10/2021 1431) ?Pt will Transfer Bed to Chair/Chair to Bed: ? with min assist ? with mod assist ?Goal: Pt Will Ambulate ?Outcome: Progressing ?Flowsheets (Taken 06/10/2021 1431) ?Pt will Ambulate: ? 25 feet ? with minimal assist ? with moderate assist ? with rolling walker ?  ?2:32 PM, 06/10/21 ?Lonell Grandchild, MPT ?Physical Therapist with [place] ?[facility] ?[phone] office ?[phone] mobile phone ? ?

## 2021-06-10 NOTE — TOC Initial Note (Addendum)
Transition of Care (TOC) - Initial/Assessment Note  ? ? ?Patient Details  ?Name: Marie House ?MRN: [id number] ?Date of Birth: [birth year] ? ?Transition of Care (TOC) CM/SW Contact:    ?Salome Arnt, LCSW ?Phone Number: ?06/10/2021, 9:14 AM ? ?Clinical Narrative:  Pt admitted with acute respiratory failure with hypoxia. Assessment completed due to high risk readmission score. Pt oriented x3. She reports she lives with her sister and is independent with ADLs. No home health services prior to admission. Home O2 was set up last hospital admission. Pt plans to return home when medically stable. She is not interested in home health at this time. LCSW also discussed with pt's sister who is in agreement. TOC will continue to follow.               ? ?Addendum: Pt was agreeable to HHPT/RN. Referred and accepted by [facility] with [facility]. However, PT/OT has recommended SNF. LCSW discussed with pt who is against SNF at this time. She agreed to discuss it with her sister this evening. LCSW left voicemail for pt's sister encouraging them to talk and make decision prior to tomorrow. FL2/PASRR completed. TOC will follow up in AM.  ? ?Expected Discharge Plan: Home/Self Care ?Barriers to Discharge: Continued Medical Work up ? ? ?Patient Goals and CMS Choice ?Patient states their goals for this hospitalization and ongoing recovery are:: return home ?  ?Choice offered to / list presented to : Patient, Sibling ? ?Expected Discharge Plan and Services ?Expected Discharge Plan: Home/Self Care ?In-house Referral: Clinical Social Work ?  ?  ?Living arrangements for the past 2 months: [place] ?                ?  ?  ?  ?  ?  ?  ?  ?  ?  ?  ? ?Prior Living Arrangements/Services ?Living arrangements for the past 2 months: [place] ?Lives with:: Siblings ?Patient language and need for interpreter reviewed:: Yes ?Do you feel safe going back to the place where you live?: Yes      ?Need for Family Participation in Patient  Care: Yes (Comment) ?Care giver support system in place?: Yes (comment) ?Current home services: DME (3N1, cane, home O2) ?Criminal Activity/Legal Involvement Pertinent to Current Situation/Hospitalization: No - Comment as needed ? ?Activities of Daily Living ?Home Assistive Devices/Equipment: None (per patient) ?ADL Screening (condition at time of admission) ?Patient's cognitive ability adequate to safely complete daily activities?: Yes ?Is the patient deaf or have difficulty hearing?: No ?Does the patient have difficulty seeing, even when wearing glasses/contacts?: No ?Does the patient have difficulty concentrating, remembering, or making decisions?: No ?Patient able to express need for assistance with ADLs?: Yes ?Does the patient have difficulty dressing or bathing?: No ?Independently performs ADLs?: Yes (appropriate for developmental age) ?Does the patient have difficulty walking or climbing stairs?: Yes ?Weakness of Legs: Both ?Weakness of Arms/Hands: None ? ?Permission Sought/Granted ?  ?  ?   ?   ?   ?   ? ?Emotional Assessment ?  ?  ?Affect (typically observed): Appropriate ?Orientation: : Oriented to Self, Oriented to Place, Oriented to Situation ?Alcohol / Substance Use: Not Applicable ?Psych Involvement: No (comment) ? ?Admission diagnosis:  Acute respiratory failure ([place]) [J96.00] ?Altered mental status, unspecified altered mental status type [R41.82] ?Patient Active Problem List  ? Diagnosis Date Noted  ? Acute respiratory failure with hypoxia and hypercarbia (HCC) Ddx includes COPD, obesity hypoventilation, viral. Respiratory fialure is likely cause of  altered mental status / lethargy 06/09/2021  ? Advance care planning 06/09/2021  ? Acute respiratory failure ([place]) 06/09/2021  ? Altered mental status   ? Abnormal CXR 05/22/2021  ? Obesity (BMI 30-39.9) 05/22/2021  ? Leukocytosis 05/22/2021  ? Hyponatremia 05/22/2021  ? CKD (chronic kidney disease) stage 4, GFR 15-29 ml/min (HCC) 05/22/2021  ? Acute  respiratory failure with hypoxia ([place])   ? Wheezing   ? Acute bronchitis 04/05/2021  ? AKI (acute kidney injury) ([place]) 04/05/2021  ? Exudative age-related macular degeneration of right eye with inactive choroidal neovascularization ([place]) 08/30/2019  ? Stable branch retinal vein occlusion of right eye 08/30/2019  ? Advanced nonexudative age-related macular degeneration of both eyes with subfoveal involvement 08/30/2019  ? 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 IDA (iron deficiency anemia) 04/16/2019  ? GERD (gastroesophageal reflux disease) 04/16/2019  ? CAD (coronary artery disease) 06/07/2013  ? Carotid artery occlusion 06/07/2013  ? CKD (chronic kidney disease) stage 3, GFR 30-59 ml/min (HCC) 06/06/2013  ? Type 2 diabetes mellitus ([place]) 06/06/2013  ? Mixed hyperlipidemia 06/06/2013  ? Essential hypertension, benign 06/06/2013  ? ?PCP:  Asencion Noble, MD ?Pharmacy:   ?[facility] [place] [place] [facility] [place] ?[address] ?[place] [address] ?Phone: [phone] Fax: [phone] ? ? ? ? ?Social Determinants of Health (SDOH) Interventions ?  ? ?Readmission Risk Interventions ? ?  06/10/2021  ?  9:07 AM  ?Readmission Risk Prevention Plan  ?Transportation Screening Complete  ?[place] or Home Care Consult Complete  ?Social Work Consult for [place] Planning/Counseling Complete  ?Palliative Care Screening Not Applicable  ?Medication Review Press photographer) Complete  ? ? ? ?

## 2021-06-10 NOTE — Hospital Course (Addendum)
Per HPI: ?Patient presented to the emergency department 06/09/2021 from home via EMS, chief complaint altered mental status and respiratory distress with wheezing and shortness of breath, patient arrived in ED saturating 88 to 91% on room air.   ?ED course today 06/09/2021: Blood gas showed elevated PCO2 to 71, chest x-ray showed no acute processes or obvious infiltrate, sodium 122, creatinine around baseline at 1.3 and GFR 37, glucose 100, white blood cells elevated to 18, hemoglobin 11.9, stable from previous.  Patient received DuoNeb x2, Solu-Medrol, was placed on BiPAP. ? ?3/29: Patient was admitted with acute hypoxemic and hypercarbic respiratory failure secondary to COPD exacerbation with associated encephalopathy.  Patient was started on breathing treatments as well as some IV steroids.  She is overall improving in terms of her mentation and has been weaned off of BiPAP.  Hyponatremia is improving with discontinuation of HCTZ. ? ?3/30: Patient doing better from respiratory standpoint, however she continues to have some ongoing hyponatremia for which fluid restriction has been ordered as well as some IV normal saline.  It appears that she had some improvement with IV normal saline on last admission.  Continue to monitor a.m. labs.  PT recommending SNF on discharge, family refusing. ?

## 2021-06-10 NOTE — Evaluation (Signed)
Physical Therapy Evaluation ?Patient Details ?Name: Marie House ?MRN: [id number] ?DOB: 08-03-1934 ?Today's Date: 06/10/2021 ? ?History of Present Illness ? Marie House 87 y.o. female who presented to the emergency department 06/09/2021 from home via EMS, chief complaint altered mental status and respiratory distress with wheezing and shortness of breath, patient arrived in ED saturating 88 to 91% on room air. ?  ?Clinical Impression ? Patient demonstrates slow labored movement for sitting up at bedside requiring Mod assist to move BLE due to weakness and pain.  Patient very unsteady on feet and limited to a few steps at bedside before having to sit due to fatigue, fall risk and BLE weakness.  Patient tolerated sitting up in chair after therapy - RN notified.  Patient will benefit from continued skilled physical therapy in hospital and recommended venue below to increase strength, balance, endurance for safe ADLs and gait.  ? ?   ?   ? ?Recommendations for follow up therapy are one component of a multi-disciplinary discharge planning process, led by the attending physician.  Recommendations may be updated based on patient status, additional functional criteria and insurance authorization. ? ?Follow Up Recommendations Skilled nursing-short term rehab (<3 hours/day) ? ?  ?Assistance Recommended at Discharge Intermittent Supervision/Assistance  ?Patient can return home with the following ? A lot of help with bathing/dressing/bathroom;A lot of help with walking and/or transfers;Help with stairs or ramp for entrance;Assistance with cooking/housework ? ?  ?Equipment Recommendations None recommended by PT  ?Recommendations for Other Services ?    ?  ?Functional Status Assessment Patient has had a recent decline in their functional status and demonstrates the ability to make significant improvements in function in a reasonable and predictable amount of time.  ? ?  ?Precautions / Restrictions Precautions ?Precautions:  Fall ?Restrictions ?Weight Bearing Restrictions: No  ? ?  ? ?Mobility ? Bed Mobility ?Overal bed mobility: Needs Assistance ?Bed Mobility: Supine to Sit ?  ?  ?Supine to sit: Mod assist ?  ?  ?General bed mobility comments: as per OT notes ?  ? ?Transfers ?Overall transfer level: Needs assistance ?Equipment used: Rolling walker (2 wheels) ?Transfers: Sit to/from Stand, Bed to chair/wheelchair/BSC ?Sit to Stand: Mod assist ?  ?Step pivot transfers: Mod assist ?  ?  ?  ?General transfer comment: as per OT notes ?  ? ?Ambulation/Gait ?Ambulation/Gait assistance: Mod assist ?Gait Distance (Feet): 5 Feet ?Assistive device: Rolling walker (2 wheels) ?Gait Pattern/deviations: Decreased step length - right, Decreased step length - left, Decreased stride length ?Gait velocity: decreased ?  ?  ?General Gait Details: limited to a few slow labored side steps at bedside due to c/o fatigue, generalized weakness on 2 LPM O2 with SpO2 at 92% ? ?Stairs ?  ?  ?  ?  ?  ? ?Wheelchair Mobility ?  ? ?Modified Rankin (Stroke Patients Only) ?  ? ?  ? ?Balance Overall balance assessment: Needs assistance ?Sitting-balance support: Feet supported, No upper extremity supported ?Sitting balance-Leahy Scale: Fair ?Sitting balance - Comments: fair/good seated at EOB ?  ?Standing balance support: During functional activity, Bilateral upper extremity supported ?Standing balance-Leahy Scale: Poor ?Standing balance comment: fair/poor using RW ?  ?  ?  ?  ?  ?  ?  ?  ?  ?  ?  ?   ? ? ? ?Pertinent Vitals/Pain Pain Assessment ?Pain Assessment: Faces ?Faces Pain Scale: Hurts little more ?Pain Location: B LE with movement ?Pain Descriptors / Indicators: Sore, Guarding ?Pain Intervention(s): Limited  activity within patient's tolerance, Monitored during session, Repositioned  ? ? ?Home Living Family/patient expects to be discharged to:: Private residence ?Living Arrangements: Other relatives ?Available Help at Discharge: Family;Available 24 hours/day ?Type  of Home: House ?Home Access: Ramped entrance ?  ?  ?  ?Home Layout: One level ?Home Equipment: Conservation officer, nature (2 wheels);Wheelchair - manual;Cane - single point ?Additional Comments: Lives with sister ; reportedly available 24/7  ?  ?Prior Function Prior Level of Function : Needs assist ?  ?  ?  ?Physical Assist : ADLs (physical) ?  ?ADLs (physical): IADLs ?Mobility Comments: Household ambulator without AD. ?ADLs Comments: Independent ADL's ; assited by sister for IADL's. ?  ? ? ?Hand Dominance  ? Dominant Hand: Right ? ?  ?Extremity/Trunk Assessment  ? Upper Extremity Assessment ?Upper Extremity Assessment: Defer to OT evaluation ?  ? ?Lower Extremity Assessment ?Lower Extremity Assessment: Generalized weakness ?  ? ?Cervical / Trunk Assessment ?Cervical / Trunk Assessment: Kyphotic  ?Communication  ? Communication: No difficulties  ?Cognition Arousal/Alertness: Awake/alert ?Behavior During Therapy: [facility] for tasks assessed/performed ?Overall Cognitive Status: Within Functional Limits for tasks assessed ?  ?  ?  ?  ?  ?  ?  ?  ?  ?  ?  ?  ?  ?  ?  ?  ?  ?  ?  ? ?  ?General Comments   ? ?  ?Exercises    ? ?Assessment/Plan  ?  ?PT Assessment Patient needs continued PT services  ?PT Problem List Decreased strength;Decreased activity tolerance;Decreased balance;Decreased mobility ? ?   ?  ?PT Treatment Interventions DME instruction;Gait training;Stair training;Functional mobility training;Therapeutic activities;Therapeutic exercise;Patient/family education;Balance training   ? ?PT Goals (Current goals can be found in the Care Plan section)  ?Acute Rehab PT Goals ?Patient Stated Goal: return home after rehab ?PT Goal Formulation: With patient ?Time For Goal Achievement: 06/24/21 ?Potential to Achieve Goals: Good ? ?  ?Frequency Min 3X/week ?  ? ? ?Co-evaluation PT/OT/SLP Co-Evaluation/Treatment: Yes ?Reason for Co-Treatment: To address functional/ADL transfers ?PT goals addressed during session: Mobility/safety with  mobility;Balance;Proper use of DME ?  ?  ? ? ?  ?AM-PAC PT "6 Clicks" Mobility  ?Outcome Measure Help needed turning from your back to your side while in a flat bed without using bedrails?: A Little ?Help needed moving from lying on your back to sitting on the side of a flat bed without using bedrails?: A Lot ?Help needed moving to and from a bed to a chair (including a wheelchair)?: A Lot ?Help needed standing up from a chair using your arms (e.g., wheelchair or bedside chair)?: A Lot ?Help needed to walk in hospital room?: A Lot ?Help needed climbing 3-5 steps with a railing? : A Lot ?6 Click Score: 13 ? ?  ?End of Session Equipment Utilized During Treatment: Oxygen ?Activity Tolerance: Patient tolerated treatment well;Patient limited by fatigue ?Patient left: in chair;with call bell/phone within reach ?Nurse Communication: Mobility status ?PT Visit Diagnosis: Unsteadiness on feet (R26.81);Other abnormalities of gait and mobility (R26.89);Muscle weakness (generalized) (M62.81) ?  ? ?Time: 4259-5638 ?PT Time Calculation (min) (ACUTE ONLY): 20 min ? ? ?Charges:   PT Evaluation ?$PT Eval Moderate Complexity: 1 Mod ?PT Treatments ?$Therapeutic Activity: 8-22 mins ?  ?   ? ? ?2:30 PM, 06/10/21 ?Lonell Grandchild, MPT ?Physical Therapist with [place] ?[facility] ?[phone] office ?[phone] mobile phone ? ? ?

## 2021-06-10 NOTE — Evaluation (Signed)
Occupational Therapy Evaluation ?Patient Details ?Name: Marie House ?MRN: [id number] ?DOB: [birth year] ?Today's Date: 06/10/2021 ? ? ?History of Present Illness Patient presented to the emergency department 06/09/2021 from home via EMS, chief complaint altered mental status and respiratory distress with wheezing and shortness of breath, patient arrived in ED saturating 88 to 91% on room air. (Per MD note)  ? ?Clinical Impression ?  ?Pt agreeable to OT and PT co-evaluation. Pt lethargic at start of session with mild increase in arousal once seated. Physical assist needed for supine to sit. Pt taken off of supplemental O2 initially but desaturated to 87% seated. Pt placed on 2L supplemental O2 and was able to stay above 90. Pt very weak with slow labored movement and moderate assist for transfer using RW. Pt also needs assist for lower body dressing despite reports of being independent at baseline. Pt will benefit from continued OT in the hospital and recommended venue below to increase strength, balance, and endurance for safe ADL's.  ? ? ?   ? ?Recommendations for follow up therapy are one component of a multi-disciplinary discharge planning process, led by the attending physician.  Recommendations may be updated based on patient status, additional functional criteria and insurance authorization.  ? ?Follow Up Recommendations ? Skilled nursing-short term rehab (<3 hours/day)  ?  ?Assistance Recommended at Discharge Intermittent Supervision/Assistance  ?Patient can return home with the following A lot of help with walking and/or transfers;A lot of help with bathing/dressing/bathroom;Assistance with cooking/housework;Assist for transportation;Help with stairs or ramp for entrance ? ?  ?Functional Status Assessment ? Patient has had a recent decline in their functional status and demonstrates the ability to make significant improvements in function in a reasonable and predictable amount of time.  ?Equipment  Recommendations ? None recommended by OT  ?  ?Recommendations for Other Services   ? ? ?  ?Precautions / Restrictions Precautions ?Precautions: Fall ?Restrictions ?Weight Bearing Restrictions: No  ? ?  ? ?Mobility Bed Mobility ?Overal bed mobility: Needs Assistance ?Bed Mobility: Supine to Sit ?  ?  ?Supine to sit: Mod assist ?  ?  ?General bed mobility comments: slow labored movement with physical assist to pull to sit. ?  ? ?Transfers ?Overall transfer level: Needs assistance ?Equipment used: Rolling walker (2 wheels) ?Transfers: Sit to/from Stand, Bed to chair/wheelchair/BSC ?Sit to Stand: Mod assist ?  ?  ?Step pivot transfers: Mod assist ?  ?  ?General transfer comment: slow labored movement ?  ? ?  ?Balance Overall balance assessment: Needs assistance ?Sitting-balance support: No upper extremity supported, Feet supported ?Sitting balance-Leahy Scale: Fair ?Sitting balance - Comments: fair to good seated at EOB ?  ?Standing balance support: During functional activity, Bilateral upper extremity supported, Reliant on assistive device for balance ?Standing balance-Leahy Scale: Poor ?Standing balance comment: using RW ?  ?  ?  ?  ?  ?  ?  ?  ?  ?  ?  ?   ? ?ADL either performed or assessed with clinical judgement  ? ?ADL Overall ADL's : Needs assistance/impaired ?  ?  ?Grooming: Moderate assistance;Standing;Minimal assistance ?  ?  ?  ?  ?  ?Upper Body Dressing : Min guard;Sitting ?  ?Lower Body Dressing: Total assistance;Maximal assistance;Sitting/lateral leans ?Lower Body Dressing Details (indicate cue type and reason): Assited to don socks seated at EOB. ?Toilet Transfer: Moderate assistance;Rolling walker (2 wheels);Stand-pivot ?Toilet Transfer Details (indicate cue type and reason): simulated via EOB to chair transfer with RW ?Toileting- Clothing Manipulation and  Hygiene: Moderate assistance;Maximal assistance;Sit to/from stand ?  ?  ?  ?Functional mobility during ADLs: Moderate assistance;Rolling walker (2  wheels);Maximal assistance ?General ADL Comments: Pt lethargic with slow labored movement.  ? ? ? ?Vision Baseline Vision/History: 1 Wears glasses ?Ability to See in Adequate Light: 1 Impaired ?Patient Visual Report: No change from baseline (Pt initially reported having increased blurry vision but then later said that was not the case.) ?Vision Assessment?: No apparent visual deficits  ?   ?   ?  ?   ?  ? ?Pertinent Vitals/Pain Pain Assessment ?Pain Assessment: Faces ?Faces Pain Scale: Hurts little more ?Pain Location: B LE with movement ?Pain Descriptors / Indicators: Other (Comment), Guarding (Pt reported arthritis.) ?Pain Intervention(s): Limited activity within patient's tolerance, Monitored during session, Repositioned  ? ? ? ?Hand Dominance Right ?  ?Extremity/Trunk Assessment Upper Extremity Assessment ?Upper Extremity Assessment: Generalized weakness ?  ?Lower Extremity Assessment ?Lower Extremity Assessment: Defer to PT evaluation ?  ?Cervical / Trunk Assessment ?Cervical / Trunk Assessment: Kyphotic ?  ?Communication Communication ?Communication: No difficulties ?  ?Cognition Arousal/Alertness: Awake/alert ?Behavior During Therapy: [facility] for tasks assessed/performed ?Overall Cognitive Status: Within Functional Limits for tasks assessed ?  ?  ?  ?  ?  ?  ?  ?  ?  ?  ?  ?  ?  ?  ?  ?  ?General Comments: Pt oriented other than year. ?  ?  ?   ? ?  ?   ?  ?    ? ? ?Home Living Family/patient expects to be discharged to:: Private residence ?Living Arrangements: Other relatives ?Available Help at Discharge: Family;Available 24 hours/day ?Type of Home: House ?Home Access: Ramped entrance ?  ?  ?Home Layout: One level ?  ?  ?Bathroom Shower/Tub: Walk-in shower ?  ?Bathroom Toilet: Handicapped height ?Bathroom Accessibility: Yes ?How Accessible: Accessible via walker ?Home Equipment: Conservation officer, nature (2 wheels);Wheelchair - manual;Cane - single point ?  ?Additional Comments: Lives with sister ; reportedly available  24/7 ?  ? ?  ?Prior Functioning/Environment Prior Level of Function : Needs assist ?  ?  ?  ?Physical Assist : ADLs (physical) ?  ?ADLs (physical): IADLs ?Mobility Comments: Household ambulator without AD. ?ADLs Comments: Independent ADL's ; assited by sister for IADL's. ?  ? ?  ?  ?OT Problem List: Decreased strength;Decreased activity tolerance;Impaired balance (sitting and/or standing) ?  ?   ?OT Treatment/Interventions: Self-care/ADL training;Therapeutic exercise;Therapeutic activities;Patient/family education;Balance training;DME and/or AE instruction  ?  ?OT Goals(Current goals can be found in the care plan section) Acute Rehab OT Goals ?Patient Stated Goal: return home ?OT Goal Formulation: With patient ?Time For Goal Achievement: 06/24/21 ?Potential to Achieve Goals: Good  ?OT Frequency: Min 2X/week ?  ? ?Co-evaluation PT/OT/SLP Co-Evaluation/Treatment: Yes ?Reason for Co-Treatment: To address functional/ADL transfers ?  ?OT goals addressed during session: ADL's and self-care ?  ? ?  ?   ?  ?  ?  ?  ?  ?  ?  ?End of Session Equipment Utilized During Treatment: Rolling walker (2 wheels);Oxygen ? ?Activity Tolerance: Patient tolerated treatment well ?Patient left: in chair;with call bell/phone within reach ? ?OT Visit Diagnosis: Unsteadiness on feet (R26.81);Other abnormalities of gait and mobility (R26.89);Muscle weakness (generalized) (M62.81)  ?              ?Time: 4010-2725 ?OT Time Calculation (min): 21 min ?Charges:  OT General Charges ?$OT Visit: 1 Visit ?OT Evaluation ?$OT Eval Moderate Complexity: 1 Mod ? ?Kally Cadden  Dwon Sky OT, MOT ? ?Larey Seat ?06/10/2021, 10:19 AM ?

## 2021-06-10 NOTE — Progress Notes (Signed)
?PROGRESS NOTE ? ? ? ?Marie House  WUG:[id number] DOB: [birth year] DOA: 06/09/2021 ?PCP: Asencion Noble, MD ? ? ?Brief Narrative:  ?Per HPI: ?Patient presented to the emergency department 06/09/2021 from home via EMS, chief complaint altered mental status and respiratory distress with wheezing and shortness of breath, patient arrived in ED saturating 88 to 91% on room air.   ?ED course today 06/09/2021: Blood gas showed elevated PCO2 to 71, chest x-ray showed no acute processes or obvious infiltrate, sodium 122, creatinine around baseline at 1.3 and GFR 37, glucose 100, white blood cells elevated to 18, hemoglobin 11.9, stable from previous.  Patient received DuoNeb x2, Solu-Medrol, was placed on BiPAP. ? ?3/29: Patient was admitted with acute hypoxemic and hypercarbic respiratory failure secondary to COPD exacerbation with associated encephalopathy.  Patient was started on breathing treatments as well as some IV steroids.  She is overall improving in terms of her mentation and has been weaned off of BiPAP.  Hyponatremia is improving with discontinuation of HCTZ.  ? ? ?Assessment & Plan: ?  ?Principal Problem: ?  Acute respiratory failure with hypoxia and hypercarbia (HCC) Ddx includes COPD, obesity hypoventilation, viral. Respiratory fialure is likely cause of altered mental status / lethargy ?Active Problems: ?  Leukocytosis ?  Hyponatremia ?  Type 2 diabetes mellitus ([place]) ?  Mixed hyperlipidemia ?  Essential hypertension, benign ?  CAD (coronary artery disease) ?  CKD (chronic kidney disease) stage 3, GFR 30-59 ml/min (HCC) ?  IDA (iron deficiency anemia) ?  GERD (gastroesophageal reflux disease) ?  Advance care planning ?  Acute respiratory failure ([place]) ? ?Assessment and Plan: ? ? ?Acute respiratory failure with hypoxia and hypercarbia (HCC) likely due to COPD, and is contirbuting to altered mental status / lethargy ?Likely contirbuting to altered mental status ?No apparent formal diagnosis COPD, was referred to  pulmonary after last admission but has not gone yet, son states she has upcoming appointment scheduled 06/26/2021, son states she smoked cigarettes a long time ago, did have some secondhand smoke exposure, no other respiratory occupational exposures that he is aware of ?Recent ER visit treated for COPD exacerbation, failed outpatient treatment  ?In ED 06/09/21 received DuoNeb x3, SoluMedrol 125 mg, placed on BiPap, minimal improvement in mental status ?Continue BiPap, repeat blood gas later this evening / tomorrow AM ?Continue DuoNeb q2-4 hours ?Respiratory PCR negative, recent negative for COVID/flu ?Discontinue cefepime ?Okay to transfer to telemetry ?  ? ?  ?Hyponatremia-improving ?Chronic, unclear if patient still taking HCTZ at home, this was held on her last hospital discharge  ?Hold HCTZ ?Add on Serum Osm, Urine Osm/Sodium ?Given chronic will not pursue aggressive correction until further workup completed  ?Monitor repeat labs ?  ?Type 2 diabetes mellitus ([place]) ?History of hypoglycemia, Glc on admission 100  ?holding home meds for now ?Basal insulin + SSI while inpatient ?  ?  ?Essential hypertension, benign ?BP fluctuating in ED, unknown if adherent to home med regimen ?Hold home meds for now ?Gentle IV fluids, no hx CHF and no clinical fluid overload ?  ?CKD (chronic kidney disease) stage 3, GFR 30-59 ml/min (HCC) ?Stable Cr/GFR ?Hold nephrotoxic rx as able  ?  ?Mixed hyperlipidemia ?Continue home statin ?  ?CAD (coronary artery disease) ?Continue home beta blocker, statin, aspirin  ?Holding ACE/ARB and other BP meds d/t CKD and episodic hypotension ?2D echocardiogram with LVEF 70-75% and grade 1 diastolic dysfunction with mild LVH on 3/29 ?  ?IDA (iron deficiency anemia) ?Chronic, stable ?Monitor routine CBC ?  ?  GERD (gastroesophageal reflux disease) ?Continue PPI ?  ? ?DVT prophylaxis: Heparin ?Code Status: Full ?Family Communication: Discussed with son on phone 3/29 ?Disposition Plan:  ?Status is:  Inpatient ?Remains inpatient appropriate because: Continued need for IV medications. ? ? ?Nutritional Assessment: ? ?The patient?s BMI is: Body mass index is 35.44 kg/m?Marland Kitchen. ? ?Seen by dietician.  I agree with the assessment and plan as outlined below: ? ?Nutrition Status: ?Nutrition Problem: Inadequate oral intake ?Etiology: acute illness (decreased appetite past 2 days) ?Signs/Symptoms: per patient/family report ?Interventions: Ensure Enlive (each supplement provides 350kcal and 20 grams of protein) ? ?. ? ? ? ?Skin Assessment: ? ?I have examined the patient?s skin and I agree with the wound assessment as performed by the wound care RN as outlined below: ? ?  ? ?Consultants:  ?None ? ?Procedures:  ?None ? ?Antimicrobials:  ?Anti-infectives (From admission, onward)  ? ? Start     Dose/Rate Route Frequency Ordered Stop  ? 06/09/21 1830  ceFEPIme (MAXIPIME) 2 g in sodium chloride 0.9 % 100 mL IVPB  Status:  Discontinued       ? 2 g ?200 mL/hr over 30 Minutes Intravenous Every 12 hours 06/09/21 1802 06/10/21 0936  ? ?  ? ? ?Subjective: ?Patient seen and evaluated today with no new acute complaints or concerns. No acute concerns or events noted overnight.  She has been weaned off BiPAP and is currently somnolent. ? ?Objective: ?Vitals:  ? 06/10/21 0836 06/10/21 1015 06/10/21 1307 06/10/21 1450  ?BP: (!) 170/99 (!) 141/54  (!) 154/49  ?Pulse: 65 (!) 59  74  ?Resp: 17   17  ?Temp:  97.8 ?F (36.6 ?C)  98.4 ?F (36.9 ?C)  ?TempSrc:  Oral    ?SpO2: 98% 96% 97% 95%  ?Weight:      ?Height:      ? ? ?Intake/Output Summary (Last 24 hours) at 06/10/2021 1457 ?Last data filed at 06/10/2021 1300 ?Gross per 24 hour  ?Intake 120 ml  ?Output --  ?Net 120 ml  ? ?Filed Weights  ? 06/09/21 1552 06/10/21 0456  ?Weight: 96.5 kg 96.6 kg  ? ? ?Examination: ? ?General exam: Appears calm and comfortable, somnolent ?Respiratory system: Clear to auscultation. Respiratory effort normal.  On nasal cannula oxygen. ?Cardiovascular system: S1 & S2 heard,  RRR.  ?Gastrointestinal system: Abdomen is soft ?Central nervous system: Somnolent ?Extremities: No edema ?Skin: No significant lesions noted ?Psychiatry: Flat affect. ? ? ? ?Data Reviewed: I have personally reviewed following labs and imaging studies ? ?CBC: ?Recent Labs  ?Lab 06/04/21 ?1209 06/09/21 ?1627 06/10/21 ?0430  ?WBC 13.6* 18.4* 14.6*  ?NEUTROABS 9.9*  --   --   ?HGB 11.7* 11.9* 12.1  ?HCT 36.1 34.7* 36.4  ?MCV 98.4 92.0 93.8  ?PLT 304 355 325  ? ?Basic Metabolic Panel: ?Recent Labs  ?Lab 06/04/21 ?1209 06/09/21 ?1627 06/10/21 ?0430  ?NA 126* 122* 124*  ?K 4.4 3.5 4.1  ?CL 83* 72* 78*  ?CO2 34* 37* 34*  ?GLUCOSE 221* 100* 182*  ?BUN 22 33* 38*  ?CREATININE 1.55* 1.39* 1.43*  ?CALCIUM 8.5* 8.4* 8.6*  ? ?GFR: ?Estimated Creatinine Clearance: 33.7 mL/min (A) (by C-G formula based on SCr of 1.43 mg/dL (H)). ?Liver Function Tests: ?Recent Labs  ?Lab 06/04/21 ?1209 06/09/21 ?1627  ?AST 20 21  ?ALT 19 18  ?ALKPHOS 93 84  ?BILITOT 0.4 0.6  ?PROT 7.3 6.4*  ?ALBUMIN 3.7 3.4*  ? ?No results for input(s): LIPASE, AMYLASE in the last 168 hours. ?No results  for input(s): AMMONIA in the last 168 hours. ?Coagulation Profile: ?No results for input(s): INR, PROTIME in the last 168 hours. ?Cardiac Enzymes: ?No results for input(s): CKTOTAL, CKMB, CKMBINDEX, TROPONINI in the last 168 hours. ?BNP (last 3 results) ?No results for input(s): PROBNP in the last 8760 hours. ?HbA1C: ?No results for input(s): HGBA1C in the last 72 hours. ?CBG: ?Recent Labs  ?Lab 06/09/21 ?1617  ?GLUCAP 103*  ? ?Lipid Profile: ?No results for input(s): CHOL, HDL, LDLCALC, TRIG, CHOLHDL, LDLDIRECT in the last 72 hours. ?Thyroid Function Tests: ?No results for input(s): TSH, T4TOTAL, FREET4, T3FREE, THYROIDAB in the last 72 hours. ?Anemia Panel: ?No results for input(s): VITAMINB12, FOLATE, FERRITIN, TIBC, IRON, RETICCTPCT in the last 72 hours. ?Sepsis Labs: ?Recent Labs  ?Lab 06/10/21 ?0430  ?PROCALCITON <0.10  ? ? ?Recent Results (from the past 240  hour(s))  ?Resp Panel by RT-PCR (Flu A&B, Covid) Nasopharyngeal Swab     Status: None  ? Collection Time: 06/04/21 12:09 PM  ? Specimen: Nasopharyngeal Swab; Nasopharyngeal(NP) swabs in vial transport medium  ?Re

## 2021-06-10 NOTE — Progress Notes (Signed)
*  PRELIMINARY RESULTS* ?Echocardiogram ?2D Echocardiogram has been performed. ? ?Marie House ?06/10/2021, 9:29 AM ?

## 2021-06-10 NOTE — Progress Notes (Signed)
Initial Nutrition Assessment ? ?DOCUMENTATION CODES:  ? ?Obesity unspecified ? ?INTERVENTION:  ?Ensure Enlive po BID, each supplement provides 350 kcal and 20 grams of protein.  ? ?If poor appetite/intake persists recommend liberalize diet to allow greater variety of foods ? ?NUTRITION DIAGNOSIS:  ? ?Inadequate oral intake related to acute illness (decreased appetite past 2 days) as evidenced by per patient/family report. ? ? ?GOAL:  ?Patient will meet greater than or equal to 90% of their needs ? ? ?MONITOR:  ?PO intake, Supplement acceptance, Labs, Weight trends ? ?REASON FOR ASSESSMENT:  ? ?Consult ?Assessment of nutrition requirement/status ? ?ASSESSMENT: Patient is an obese 87 yo female with hx of CKD-3, DM2, CAD. She presents with altered mental status and respiratory distress.   ? ?Patient lunch is here but she is refusing tray. Meal intake at breakfast -5%. Patient was will ing to drink an Ensure and RD provided chocolate. She completed most of ONS while I was with her. Feeds self at baseline. Her sister helps her with meals at home. Patient eats mostly sandwich's and other convince type foods.  ? ?Patient weight has been stable the past 2 plus years at 96-97 kg. Mild BLE pitting edema.  ? ?Medications: Protonix, Nicotine patch.  ? ?Labs: sodium 124, BUN 38 and Cr 1.43 , WBC-14.6.  ?04/05/21- A1C- 6.3%  ? ?NUTRITION - FOCUSED PHYSICAL EXAM: ? ?Flowsheet Row Most Recent Value  ?Orbital Region Mild depletion  ?Upper Arm Region No depletion  ?Thoracic and Lumbar Region No depletion  ?Buccal Region No depletion  ?Temple Region Mild depletion  ?Clavicle Bone Region No depletion  ?Clavicle and Acromion Bone Region No depletion  ?Scapular Bone Region No depletion  ?Dorsal Hand No depletion  ?Patellar Region No depletion  ?Anterior Thigh Region No depletion  ?Posterior Calf Region No depletion  ?Edema (RD Assessment) Mild  ?Hair Reviewed  ?Eyes Reviewed  ?Mouth Reviewed  ?Skin Reviewed  ?Nails Reviewed  ? ?   ? ? ?Diet Order:   ?Diet Order   ? ?       ?  Diet Heart Room service appropriate? Yes; Fluid consistency: Thin  Diet effective now       ?  ? ?  ?  ? ?  ? ? ?EDUCATION NEEDS:  ?Education needs have been addressed ? ?Skin:  Skin Assessment: Reviewed RN Assessment ? ?Last BM:  prior to admission ? ?Height:  ? ?Ht Readings from Last 1 Encounters:  ?06/09/21 '5\' 5"'$  (1.651 m)  ? ? ?Weight:  ? ?Wt Readings from Last 1 Encounters:  ?06/10/21 96.6 kg  ? ? ?Ideal Body Weight:   57 kg ? ?BMI:  Body mass index is 35.44 kg/m?. ? ?Estimated Nutritional Needs:  ? ?Kcal:  1700-1800 ? ?Protein:  64-70 gr ? ?Fluid:  1600 ml daily ? ?Colman Cater MS,RD,CSG,LDN ?Contact: AMION   ? ? ?

## 2021-06-10 NOTE — Plan of Care (Signed)
?  Problem: Acute Rehab OT Goals (only OT should resolve) ?Goal: Pt. Will Perform Grooming ?Flowsheets (Taken 06/10/2021 1021) ?Pt Will Perform Grooming: ? standing ? with min guard assist ?Goal: Pt. Will Perform Upper Body Dressing ?Flowsheets (Taken 06/10/2021 1021) ?Pt Will Perform Upper Body Dressing: ? Independently ? sitting ?Goal: Pt. Will Perform Lower Body Dressing ?Flowsheets (Taken 06/10/2021 1021) ?Pt Will Perform Lower Body Dressing: ? with supervision ? sitting/lateral leans ? with adaptive equipment ?Goal: Pt. Will Transfer To Toilet ?Flowsheets (Taken 06/10/2021 1021) ?Pt Will Transfer to Toilet: ? with supervision ? stand pivot transfer ?Goal: Pt. Will Perform Toileting-Clothing Manipulation ?Flowsheets (Taken 06/10/2021 1021) ?Pt Will Perform Toileting - Clothing Manipulation and hygiene: ? with min guard assist ? sit to/from stand ?Goal: Pt/Caregiver Will Perform Home Exercise Program ?Flowsheets (Taken 06/10/2021 1021) ?Pt/caregiver will Perform Home Exercise Program: ? Increased strength ? Both right and left upper extremity ? Independently ? Marshel Golubski OT, MOT ? ?

## 2021-06-11 DIAGNOSIS — J9601 Acute respiratory failure with hypoxia: Secondary | ICD-10-CM | POA: Diagnosis not present

## 2021-06-11 DIAGNOSIS — J9602 Acute respiratory failure with hypercapnia: Secondary | ICD-10-CM | POA: Diagnosis not present

## 2021-06-11 LAB — MAGNESIUM: Magnesium: 2.3 mg/dL (ref 1.7–2.4)

## 2021-06-11 LAB — BASIC METABOLIC PANEL
Anion gap: 9 (ref 5–15)
BUN: 51 mg/dL — ABNORMAL HIGH (ref 8–23)
CO2: 36 mmol/L — ABNORMAL HIGH (ref 22–32)
Calcium: 8.6 mg/dL — ABNORMAL LOW (ref 8.9–10.3)
Chloride: 79 mmol/L — ABNORMAL LOW (ref 98–111)
Creatinine, Ser: 1.59 mg/dL — ABNORMAL HIGH (ref 0.44–1.00)
GFR, Estimated: 32 mL/min — ABNORMAL LOW (ref >60)
Glucose, Bld: 187 mg/dL — ABNORMAL HIGH (ref 70–99)
Potassium: 4.1 mmol/L (ref 3.5–5.1)
Sodium: 124 mmol/L — ABNORMAL LOW (ref 135–145)

## 2021-06-11 LAB — CBC
HCT: 32.6 % — ABNORMAL LOW (ref 36.0–46.0)
Hemoglobin: 10.8 g/dL — ABNORMAL LOW (ref 12.0–15.0)
MCH: 30.9 pg (ref 26.0–34.0)
MCHC: 33.1 g/dL (ref 30.0–36.0)
MCV: 93.1 fL (ref 80.0–100.0)
Platelets: 325 10*3/uL (ref 150–400)
RBC: 3.5 MIL/uL — ABNORMAL LOW (ref 3.87–5.11)
RDW: 13.2 % (ref 11.5–15.5)
WBC: 18.5 10*3/uL — ABNORMAL HIGH (ref 4.0–10.5)
nRBC: 0 % (ref 0.0–0.2)

## 2021-06-11 LAB — SODIUM
Sodium: 119 mmol/L — CL (ref 135–145)
Sodium: 122 mmol/L — ABNORMAL LOW (ref 135–145)

## 2021-06-11 LAB — TSH: TSH: 4.19 u[IU]/mL (ref 0.350–4.500)

## 2021-06-11 MED ORDER — IPRATROPIUM-ALBUTEROL 0.5-2.5 (3) MG/3ML IN SOLN
3.0000 mL | Freq: Two times a day (BID) | RESPIRATORY_TRACT | Status: DC
Start: 2021-06-11 — End: 2021-06-15
  Administered 2021-06-12 – 2021-06-15 (×7): 3 mL via RESPIRATORY_TRACT
  Filled 2021-06-11 (×9): qty 3

## 2021-06-11 MED ORDER — SODIUM CHLORIDE 0.9 % IV SOLN: INTRAVENOUS | Status: DC

## 2021-06-11 NOTE — TOC Progression Note (Signed)
Transition of Care (TOC) - Progression Note  ? ? ?Patient Details  ?Name: Marie House ?MRN: [id number] ?Date of Birth: 1934/09/28 ? ?Transition of Care (TOC) CM/SW Contact  ?Shade Flood, LCSW ?Phone Number: ?06/11/2021, 2:13 PM ? ?Clinical Narrative:    ? ?TOC following. Spoke with pt's son about dc planning. Updated on PT recommendations and pt's functional status. Son spoke with pt and other family members and updated this LCSW that pt does not want to go to SNF and that they will make it work with [facility] at home upon dc. ? ?MD anticipating 1-2 days for dc. TOC will follow. ? ?Expected Discharge Plan: Home/Self Care ?Barriers to Discharge: Continued Medical Work up ? ?Expected Discharge Plan and Services ?Expected Discharge Plan: Home/Self Care ?In-house Referral: Clinical Social Work ?  ?  ?Living arrangements for the past 2 months: [place] ?                ?  ?  ?  ?  ?  ?  ?  ?  ?  ?  ? ? ?Social Determinants of Health (SDOH) Interventions ?  ? ?Readmission Risk Interventions ? ?  06/10/2021  ?  9:07 AM  ?Readmission Risk Prevention Plan  ?Transportation Screening Complete  ?[place] or Home Care Consult Complete  ?Social Work Consult for [place] Planning/Counseling Complete  ?Palliative Care Screening Not Applicable  ?Medication Review Press photographer) Complete  ? ? ?

## 2021-06-11 NOTE — Progress Notes (Signed)
PT Cancellation Note ? ?Patient Details ?Name: Marie House ?MRN: [id number] ?DOB: 09/04/1934 ? ? ?Cancelled Treatment:    Reason Eval/Treat Not Completed: Patient declined, no reason specified.  Patient declined therapy due to c/o fatigue after having been up to chair earlier with nursing staff assisting.  Will check back tomorrow. ? ? ?2:55 PM, 06/11/21 ?Lonell Grandchild, MPT ?Physical Therapist with [place] ?[facility] ?[phone] office ?[phone] mobile phone ? ?

## 2021-06-11 NOTE — Progress Notes (Addendum)
?PROGRESS NOTE ? ? ? ?Marie House  LGX:[id number] DOB: 1935-02-21 DOA: 06/09/2021 ?PCP: Asencion Noble, MD ? ? ?Brief Narrative:  ?Per HPI: ?Patient presented to the emergency department 06/09/2021 from home via EMS, chief complaint altered mental status and respiratory distress with wheezing and shortness of breath, patient arrived in ED saturating 88 to 91% on room air.   ?ED course today 06/09/2021: Blood gas showed elevated PCO2 to 71, chest x-ray showed no acute processes or obvious infiltrate, sodium 122, creatinine around baseline at 1.3 and GFR 37, glucose 100, white blood cells elevated to 18, hemoglobin 11.9, stable from previous.  Patient received DuoNeb x2, Solu-Medrol, was placed on BiPAP. ? ?3/29: Patient was admitted with acute hypoxemic and hypercarbic respiratory failure secondary to COPD exacerbation with associated encephalopathy.  Patient was started on breathing treatments as well as some IV steroids.  She is overall improving in terms of her mentation and has been weaned off of BiPAP.  Hyponatremia is improving with discontinuation of HCTZ. ? ?3/30: Patient doing better from respiratory standpoint, however she continues to have some ongoing hyponatremia for which fluid restriction has been ordered as well as some IV normal saline.  It appears that she had some improvement with IV normal saline on last admission.  Continue to monitor a.m. labs.  PT recommending SNF on discharge, family refusing.  ? ? ?Assessment & Plan: ?  ?Principal Problem: ?  Acute respiratory failure with hypoxia and hypercarbia (HCC) Ddx includes COPD, obesity hypoventilation, viral. Respiratory fialure is likely cause of altered mental status / lethargy ?Active Problems: ?  Leukocytosis ?  Hyponatremia ?  Type 2 diabetes mellitus ([place]) ?  Mixed hyperlipidemia ?  Essential hypertension, benign ?  CAD (coronary artery disease) ?  CKD (chronic kidney disease) stage 3, GFR 30-59 ml/min (HCC) ?  IDA (iron deficiency anemia) ?  GERD  (gastroesophageal reflux disease) ?  Advance care planning ?  Acute respiratory failure ([place]) ? ?Assessment and Plan: ? ? ?Acute respiratory failure with hypoxia and hypercarbia (HCC) likely due to COPD, and is contirbuting to altered mental status / lethargy ?Likely contirbuting to altered mental status ?No apparent formal diagnosis COPD, was referred to pulmonary after last admission but has not gone yet, son states she has upcoming appointment scheduled 06/26/2021, son states she smoked cigarettes a long time ago, did have some secondhand smoke exposure, no other respiratory occupational exposures that he is aware of ?Recent ER visit treated for COPD exacerbation, failed outpatient treatment  ?In ED 06/09/21 received DuoNeb x3, SoluMedrol 125 mg, placed on BiPap, minimal improvement in mental status, now on nasal cannula ?Continue DuoNebs as needed for shortness of breath or wheezing ?Patient currently on prednisone and Pulmicort twice daily ?Respiratory PCR negative, recent negative for COVID/flu ?Cefepime discontinued 3/29 ?: Improved on nasal cannula oxygen ?  ?  ?Hyponatremia ?Chronic, unclear if patient still taking HCTZ at home, this was held on her last hospital discharge  ?Hold HCTZ ?Add on Serum Osm, Urine Osm/Sodium ?IV fluid with normal saline along with fluid restriction ?Given chronic will not pursue aggressive correction until further workup completed  ?Monitor repeat labs ?  ?Type 2 diabetes mellitus ([place]) ?History of hypoglycemia, Glc on admission 100  ?holding home meds for now ?Basal insulin + SSI while inpatient ?  ?  ?Essential hypertension, benign ?BP fluctuating in ED, unknown if adherent to home med regimen ?Hold home meds for now ?Gentle IV fluids, no hx CHF and no clinical fluid overload ?  ?  CKD (chronic kidney disease) stage 3, GFR 30-59 ml/min (HCC) ?Stable Cr/GFR ?Hold nephrotoxic rx as able  ?  ?Mixed hyperlipidemia ?Continue home statin ?  ?CAD (coronary artery disease) ?Continue home  beta blocker, statin, aspirin  ?Holding ACE/ARB and other BP meds d/t CKD and episodic hypotension ?2D echocardiogram with LVEF 70-75% and grade 1 diastolic dysfunction with mild LVH on 3/29 ?  ?IDA (iron deficiency anemia) ?Chronic ?Monitor routine CBC ?  ?GERD (gastroesophageal reflux disease) ?Continue PPI ?  ?  ?DVT prophylaxis: Heparin ?Code Status: Full ?Family Communication: Discussed with son on phone 3/30 ?Disposition Plan:  ?Status is: Inpatient ?Remains inpatient appropriate because: Continued need for IV medications. ?  ?  ?Nutritional Assessment: ?  ?The patient?s BMI is: Body mass index is 35.44 kg/m?Marland Kitchen. ?  ?Seen by dietician.  I agree with the assessment and plan as outlined below: ?  ?Nutrition Status: ?Nutrition Problem: Inadequate oral intake ?Etiology: acute illness (decreased appetite past 2 days) ?Signs/Symptoms: per patient/family report ?Interventions: Ensure Enlive (each supplement provides 350kcal and 20 grams of protein) ?   ?Consultants:  ?None ?  ?Procedures:  ?None ?  ?Antimicrobials:  ?Anti-infectives (From admission, onward)  ? ? Start     Dose/Rate Route Frequency Ordered Stop  ? 06/09/21 1830  ceFEPIme (MAXIPIME) 2 g in sodium chloride 0.9 % 100 mL IVPB  Status:  Discontinued       ? 2 g ?200 mL/hr over 30 Minutes Intravenous Every 12 hours 06/09/21 1802 06/10/21 0936  ? ?  ? ? ?Subjective: ?Patient seen and evaluated today with no new acute complaints or concerns. No acute concerns or events noted overnight. ? ?Objective: ?Vitals:  ? 06/10/21 2129 06/11/21 8469 06/11/21 0710 06/11/21 0955  ?BP: (!) 143/55 (!) 129/55    ?Pulse: 63 63    ?Resp: 14 16    ?Temp: (!) 97.5 ?F (36.4 ?C) 98 ?F (36.7 ?C)    ?TempSrc: Oral Oral    ?SpO2: 95% 97% 97% 97%  ?Weight:      ?Height:      ? ? ?Intake/Output Summary (Last 24 hours) at 06/11/2021 1142 ?Last data filed at 06/11/2021 0900 ?Gross per 24 hour  ?Intake 420 ml  ?Output --  ?Net 420 ml  ? ?Filed Weights  ? 06/09/21 1552 06/10/21 0456  ?Weight:  96.5 kg 96.6 kg  ? ? ?Examination: ? ?General exam: Appears calm and comfortable  ?Respiratory system: Clear to auscultation. Respiratory effort normal.  On nasal cannula oxygen. ?Cardiovascular system: S1 & S2 heard, RRR.  ?Gastrointestinal system: Abdomen is soft ?Central nervous system: Alert and awake ?Extremities: No edema ?Skin: No significant lesions noted ?Psychiatry: Flat affect. ? ? ? ?Data Reviewed: I have personally reviewed following labs and imaging studies ? ?CBC: ?Recent Labs  ?Lab 06/04/21 ?1209 06/09/21 ?1627 06/10/21 ?0430 06/11/21 ?0410  ?WBC 13.6* 18.4* 14.6* 18.5*  ?NEUTROABS 9.9*  --   --   --   ?HGB 11.7* 11.9* 12.1 10.8*  ?HCT 36.1 34.7* 36.4 32.6*  ?MCV 98.4 92.0 93.8 93.1  ?PLT 304 355 325 325  ? ?Basic Metabolic Panel: ?Recent Labs  ?Lab 06/04/21 ?1209 06/09/21 ?1627 06/10/21 ?0430 06/11/21 ?0410  ?NA 126* 122* 124* 124*  ?K 4.4 3.5 4.1 4.1  ?CL 83* 72* 78* 79*  ?CO2 34* 37* 34* 36*  ?GLUCOSE 221* 100* 182* 187*  ?BUN 22 33* 38* 51*  ?CREATININE 1.55* 1.39* 1.43* 1.59*  ?CALCIUM 8.5* 8.4* 8.6* 8.6*  ?MG  --   --   --  2.3  ? ?GFR: ?Estimated Creatinine Clearance: 30.3 mL/min (A) (by C-G formula based on SCr of 1.59 mg/dL (H)). ?Liver Function Tests: ?Recent Labs  ?Lab 06/04/21 ?1209 06/09/21 ?1627  ?AST 20 21  ?ALT 19 18  ?ALKPHOS 93 84  ?BILITOT 0.4 0.6  ?PROT 7.3 6.4*  ?ALBUMIN 3.7 3.4*  ? ?No results for input(s): LIPASE, AMYLASE in the last 168 hours. ?No results for input(s): AMMONIA in the last 168 hours. ?Coagulation Profile: ?No results for input(s): INR, PROTIME in the last 168 hours. ?Cardiac Enzymes: ?No results for input(s): CKTOTAL, CKMB, CKMBINDEX, TROPONINI in the last 168 hours. ?BNP (last 3 results) ?No results for input(s): PROBNP in the last 8760 hours. ?HbA1C: ?No results for input(s): HGBA1C in the last 72 hours. ?CBG: ?Recent Labs  ?Lab 06/09/21 ?1617  ?GLUCAP 103*  ? ?Lipid Profile: ?No results for input(s): CHOL, HDL, LDLCALC, TRIG, CHOLHDL, LDLDIRECT in the last  72 hours. ?Thyroid Function Tests: ?Recent Labs  ?  06/11/21 ?0410  ?TSH 4.190  ? ?Anemia Panel: ?No results for input(s): VITAMINB12, FOLATE, FERRITIN, TIBC, IRON, RETICCTPCT in the last 72 hours. ?Sep

## 2021-06-11 NOTE — Progress Notes (Signed)
Critical Na+ of 119 per lab. Notified MD.

## 2021-06-12 LAB — SODIUM, URINE, RANDOM: Sodium, Ur: 16 mmol/L

## 2021-06-12 LAB — CBC
HCT: 33.4 % — ABNORMAL LOW (ref 36.0–46.0)
Hemoglobin: 11.1 g/dL — ABNORMAL LOW (ref 12.0–15.0)
MCH: 31.5 pg (ref 26.0–34.0)
MCHC: 33.2 g/dL (ref 30.0–36.0)
MCV: 94.9 fL (ref 80.0–100.0)
Platelets: 322 10*3/uL (ref 150–400)
RBC: 3.52 MIL/uL — ABNORMAL LOW (ref 3.87–5.11)
RDW: 13.1 % (ref 11.5–15.5)
WBC: 17.1 10*3/uL — ABNORMAL HIGH (ref 4.0–10.5)
nRBC: 0 % (ref 0.0–0.2)

## 2021-06-12 LAB — BASIC METABOLIC PANEL
Anion gap: 8 (ref 5–15)
BUN: 51 mg/dL — ABNORMAL HIGH (ref 8–23)
CO2: 38 mmol/L — ABNORMAL HIGH (ref 22–32)
Calcium: 8.8 mg/dL — ABNORMAL LOW (ref 8.9–10.3)
Chloride: 79 mmol/L — ABNORMAL LOW (ref 98–111)
Creatinine, Ser: 1.62 mg/dL — ABNORMAL HIGH (ref 0.44–1.00)
GFR, Estimated: 31 mL/min — ABNORMAL LOW (ref >60)
Glucose, Bld: 252 mg/dL — ABNORMAL HIGH (ref 70–99)
Potassium: 4.5 mmol/L (ref 3.5–5.1)
Sodium: 125 mmol/L — ABNORMAL LOW (ref 135–145)

## 2021-06-12 LAB — MAGNESIUM: Magnesium: 2.2 mg/dL (ref 1.7–2.4)

## 2021-06-12 LAB — OSMOLALITY, URINE: Osmolality, Ur: 372 mOsm/kg (ref 300–900)

## 2021-06-12 MED ORDER — FUROSEMIDE 10 MG/ML IJ SOLN
20.0000 mg | Freq: Once | INTRAMUSCULAR | Status: AC
  Administered 2021-06-12: 20 mg via INTRAVENOUS
  Filled 2021-06-12: qty 2

## 2021-06-12 NOTE — Consult Note (Signed)
[facility] ?Renal Consultation Note ? ?Requesting MD: Skipper Cliche, MD ?Indication for Consultation:  hyponatremia ? ?Chief complaint: shortness of breath and altered mental status  ? ?HPI:  ?Marie House is a 87 y.o. female with a history of coronary artery disease, CKD stage III, arthritis, hypertension, and type 2 diabetes mellitus who presented to the hospital with altered mental status and wheezing on 06/09/21.  She has been treated for a COPD exacerbation.  She had another recent COPD exacerbation as well.  She was found to have hyponatremia.  Nephrology is consulted for assistance with the same.  She was on HCTZ at home and this was held here.  She has also been on trazodone. She got NS at 75 ml/hr yesterday.  On 06/09/21 her sodium was 122 - this improved to 124 and then dropped again to 119 on 3/30.  Sodium has improved to 125 this am.  ? ?Her son and grandson are here in the room on my interview.  She is a litte confused still which does limit the interview.  Her sister lives with her and her sister cooks.  For breakfast they have bacon and eggs.  For lunch a sandwich and chips.  Dinner is different meal and she thinks she gets enough to eat.  She's not able to tell me her medications or if on HCTZ still but she knows that she was on up to at least the recent past.  She denies any family history of thyroid or adrenal issues.  She was started on oxygen a couple of weeks ago.  ? ?Creatinine, Ser  ?Date/Time Value Ref Range Status  ?06/12/2021 03:31 AM 1.62 (H) 0.44 - 1.00 mg/dL Final  ?06/11/2021 04:10 AM 1.59 (H) 0.44 - 1.00 mg/dL Final  ?06/10/2021 04:30 AM 1.43 (H) 0.44 - 1.00 mg/dL Final  ?06/09/2021 04:27 PM 1.39 (H) 0.44 - 1.00 mg/dL Final  ?06/04/2021 12:09 PM 1.55 (H) 0.44 - 1.00 mg/dL Final  ?05/24/2021 07:59 AM 1.59 (H) 0.44 - 1.00 mg/dL Final  ?05/22/2021 11:40 AM 1.77 (H) 0.44 - 1.00 mg/dL Final  ?05/22/2021 04:20 AM 1.85 (H) 0.44 - 1.00 mg/dL Final  ?05/21/2021 10:53 PM 1.90  (H) 0.44 - 1.00 mg/dL Final  ?04/06/2021 04:17 AM 1.74 (H) 0.44 - 1.00 mg/dL Final  ?04/05/2021 02:55 PM 2.03 (H) 0.44 - 1.00 mg/dL Final  ?09/15/2018 07:02 PM 1.66 (H) 0.44 - 1.00 mg/dL Final  ?10/14/2015 10:10 AM 1.53 (H) 0.44 - 1.00 mg/dL Final  ?05/07/2014 01:29 PM 1.90 (H) 0.50 - 1.10 mg/dL Final  ?05/07/2014 01:25 PM 1.90 (H) 0.50 - 1.10 mg/dL Final  ? ? ? ?PMHx: ?  ?Past Medical History:  ?Diagnosis Date  ? Arthritis   ? Carotid artery disease ([place])   ? CKD (chronic kidney disease) stage 3, GFR 30-59 ml/min (HCC)   ? Coronary atherosclerosis of native coronary artery   ? [place] DES LAD 2003, Taxus DES LAD 2004  ? Essential hypertension, benign   ? Mixed hyperlipidemia   ? Type 2 diabetes mellitus ([place])   ? ? ?Past Surgical History:  ?Procedure Laterality Date  ? CAROTID ENDARTERECTOMY Right 2004  ? CATARACT EXTRACTION    ? CHOLECYSTECTOMY    ? Left breast mastectomy    ? 25 yrs ago  ? MASTECTOMY Left   ? PARTIAL HYSTERECTOMY    ? TONSILLECTOMY    ? ? ?Family Hx:  ?Family History  ?Problem Relation Age of Onset  ? CAD Mother   ?She denies any  family history of thyroid or adrenal issues.   ? ?Social History:  reports that she quit smoking about 15 years ago. Her smoking use included cigarettes. She has never used smokeless tobacco. She reports that she does not drink alcohol and does not use drugs. ? ?Allergies:  ?Allergies  ?Allergen Reactions  ? Altace [Ramipril]   ? Atacand [Candesartan]   ? Avandia [Rosiglitazone]   ? Crestor [Rosuvastatin]   ? Diovan [Valsartan]   ? Lisinopril   ? Lotensin [Benazepril]   ? Norvasc [Amlodipine Besylate]   ? Zetia [Ezetimibe]   ? ? ?Medications: ?Prior to Admission medications   ?Medication Sig Start Date End Date Taking? Authorizing Provider  ?ALPRAZolam (XANAX) 0.5 MG tablet Take 0.5 mg by mouth 3 (three) times daily as needed for anxiety.    [provider]  ?aspirin EC 81 MG tablet Take 81 mg by mouth daily.    [provider]  ?benzonatate (TESSALON)  200 MG capsule Take 200 mg by mouth 3 (three) times daily as needed for cough. ?Patient not taking: Reported on 05/22/2021 03/31/21   [provider]  ?carvedilol (COREG) 12.5 MG tablet Take 12.5 mg by mouth 2 (two) times daily with a meal.     [provider]  ?dextromethorphan-guaiFENesin (MUCINEX DM) 30-600 MG 12hr tablet Take 1 tablet by mouth 2 (two) times daily. ?Patient not taking: Reported on 06/12/2021 04/06/21   Barton Dubois, MD  ?DILT-XR 120 MG 24 hr capsule Take 120 mg by mouth daily. ?Patient not taking: Reported on 06/04/2021 01/08/21   [provider]  ?Cleda Clarks 100 UNIT/ML KwikPen Inject 7 Units into the skin 2 (two) times daily before a meal. Follow up with Dr. Willey Blade to see if this needs to be adjusted further. 05/24/21   Elodia Florence., MD  ?hydrALAZINE (APRESOLINE) 10 MG tablet Take 10 mg by mouth 2 (two) times daily. 04/28/21   [provider]  ?hydrochlorothiazide (HYDRODIURIL) 25 MG tablet Take 25 mg by mouth daily. 05/25/21   [provider]  ?HYDROcodone-acetaminophen (NORCO/VICODIN) 5-325 MG tablet One tablet every six hours for pain.  Limit 7 days. ?Patient not taking: Reported on 06/04/2021 04/30/21   Sanjuana Kava, MD  ?Insulin Glargine [facility]) 100 UNIT/ML Inject 25 Units into the skin 2 (two) times daily. Use a reduced dose for now, follow up with Dr. Willey Blade to discuss whether you need to adjust your insulin 05/24/21   Elodia Florence., MD  ?Ipratropium-Albuterol (COMBIVENT) 20-100 MCG/ACT AERS respimat Inhale 1 puff into the lungs every 6 (six) hours as needed for wheezing or shortness of breath. 06/04/21   Carmin Muskrat, MD  ?Multiple Vitamins-Minerals (CENTRUM SILVER PO) Take 1 tablet by mouth daily with breakfast.    [provider]  ?nitroGLYCERIN (NITROSTAT) 0.4 MG SL tablet ONE TABLET UNDER TONGUE AS NEEDED FOR CHEST PAIN EVERY 5 MINUTES CONTACT MD AS DIRECTED ?Patient taking differently: Place 0.4 mg  under the tongue every 5 (five) minutes as needed. 12/03/16   Satira Sark, MD  ?omeprazole (PRILOSEC) 20 MG capsule Take 1 capsule (20 mg total) by mouth 2 (two) times daily before a meal. 04/06/21   Barton Dubois, MD  ?pravastatin (PRAVACHOL) 80 MG tablet Take 80 mg by mouth daily.    [provider]  ?predniSONE (DELTASONE) 20 MG tablet Take 2 tablets (40 mg total) by mouth daily with breakfast. For the next four days ?Patient not taking: Reported on 06/12/2021 06/04/21   Carmin Muskrat,  MD  ?traZODone (DESYREL) 50 MG tablet Take 50 mg by mouth at bedtime.    [provider]  ? ? I have reviewed the patient's current and reported prior to admission medications. ? ?Labs:  ? ?  Latest Ref Rng & Units 06/12/2021  ?  3:31 AM 06/11/2021  ? 10:05 PM 06/11/2021  ?  3:57 PM  ?BMP  ?Glucose 70 - 99 mg/dL 252      ?BUN 8 - 23 mg/dL 51      ?Creatinine 0.44 - 1.00 mg/dL 1.62      ?Sodium 135 - 145 mmol/L 125   122   119    ?Potassium 3.5 - 5.1 mmol/L 4.5      ?Chloride 98 - 111 mmol/L 79      ?CO2 22 - 32 mmol/L 38      ?Calcium 8.9 - 10.3 mg/dL 8.8      ? ? ? ?ROS: ? ?Pertinent items noted in HPI and remainder of comprehensive ROS otherwise negative. ? ?Physical Exam: ?Vitals:  ? 06/12/21 0707 06/12/21 0729  ?BP:    ?Pulse:    ?Resp:    ?Temp:    ?SpO2: 97% 97%  ?   ?General: elderly female in chair in NAD   ?HEENT: NCAT  ?Eyes: EOMI sclera anicteric ?Neck: supple trachea midline ?Heart: S1S2 nor rub ?Lungs: clear but reduced; wet sounding cough on arrival not duplicated. On supp oxygen 2 liters; unlabored at rest and a little work of breathing with exertion   ?Abdomen: soft/nt/nd; obese habitus ?Extremities: no pitting edema ?Skin: no rash on extremities exposed ?Neuro: patient oriented to person  ? ?Assessment/Plan: ? ?# Hyponatremia  ?- May be 2/2 SIADH related to meds. TSH ok.   ?- Please hold HCTZ and trazodone - would not resume either medication on discharge  ?- urine osm pending and  serum osms  were done the day prior at 264  ?- for now hold additional fluids - she is improving  ?- check cortisol in AM  ?- she currently has a 1.5 liter fluid restriction - follow on same  ? ?# CKD stage 3 ?- with

## 2021-06-12 NOTE — Care Management Important Message (Signed)
Important Message ? ?Patient Details  ?Name: Marie House ?MRN: [id number] ?Date of Birth: Jul 31, 1934 ? ? ?Medicare Important Message Given:  Yes ? ? ? ? ?Tommy Medal ?06/12/2021, 3:23 PM ?

## 2021-06-12 NOTE — Progress Notes (Signed)
?PROGRESS NOTE ? ? ? ?Marie House  XHB:[id number] DOB: Aug 11, 1934 DOA: 06/09/2021 ?PCP: Asencion Noble, MD ? ? ?Subjective: ?The patient was seen and examined this morning, stable in no acute distress, satting 88-to 91% on room air, with supplemental oxygen 2.5 L satting 97% ?Complain of generalized weakness denies having any chest pain ? ? ?Brief Narrative:  ?Per HPI: ?Patient presented to the emergency department 06/09/2021 from home via EMS, chief complaint altered mental status and respiratory distress with wheezing and shortness of breath, patient arrived in ED saturating 88 to 91% on room air.   ?ED course today 06/09/2021: Blood gas showed elevated PCO2 to 71, chest x-ray showed no acute processes or obvious infiltrate, sodium 122, creatinine around baseline at 1.3 and GFR 37, glucose 100, white blood cells elevated to 18, hemoglobin 11.9, stable from previous.  Patient received DuoNeb x2, Solu-Medrol, was placed on BiPAP. ? ?3/29: Patient was admitted with acute hypoxemic and hypercarbic respiratory failure secondary to COPD exacerbation with associated encephalopathy.  Patient was started on breathing treatments as well as some IV steroids.  She is overall improving in terms of her mentation and has been weaned off of BiPAP.  Hyponatremia is improving with discontinuation of HCTZ. ? ?3/30: Patient doing better from respiratory standpoint, however she continues to have some ongoing hyponatremia for which fluid restriction has been ordered as well as some IV normal saline.  It appears that she had some improvement with IV normal saline on last admission.  Continue to monitor a.m. labs.  PT recommending SNF on discharge, family refusing.  ? ? ?Assessment & Plan: ?  ?Principal Problem: ?  Acute respiratory failure with hypoxia and hypercarbia (HCC) Ddx includes COPD, obesity hypoventilation, viral. Respiratory fialure is likely cause of altered mental status / lethargy ?Active Problems: ?  Leukocytosis ?   Hyponatremia ?  Type 2 diabetes mellitus ([place]) ?  Mixed hyperlipidemia ?  Essential hypertension, benign ?  CAD (coronary artery disease) ?  CKD (chronic kidney disease) stage 3, GFR 30-59 ml/min (HCC) ?  IDA (iron deficiency anemia) ?  GERD (gastroesophageal reflux disease) ?  Advance care planning ?  Acute respiratory failure ([place]) ? ?Assessment and Plan: ? ? ?Acute respiratory failure with hypoxia and hypercarbia (HCC) likely due to COPD, and is contirbuting to altered mental status / lethargy ?Much improved ?-Mental status improved back to baseline ? ?No apparent formal diagnosis COPD, was referred to pulmonary after last admission but has not gone yet, son states she has upcoming appointment scheduled 06/26/2021, son states she smoked cigarettes a long time ago, did have some secondhand smoke exposure, no other respiratory occupational exposures that he is aware of ?Recent ER visit treated for COPD exacerbation, failed outpatient treatment  ?In ED 06/09/21 received DuoNeb x3, SoluMedrol 125 mg, placed on BiPap, minimal improvement in mental status, now on nasal cannula ?Continue DuoNebs as needed for shortness of breath or wheezing ?Patient currently on prednisone and Pulmicort twice daily ?Respiratory PCR negative, recent negative for COVID/flu ?Cefepime discontinued 3/29 ?: Improved on nasal cannula oxygen ?  ?  ?Hyponatremia ?Sodium 124, 119, 122 >>125 now  ?Chronic, unclear if patient still taking HCTZ at home, this was held on her last hospital discharge  ?Hold HCTZ >> discontinued now ?Holding trazodone >> discontinued now  ?Possible SIADH ?Serum Osm (264 - low), Urine Osm(  )Sodium, urine sodium (16) ?IV fluid with normal saline along with fluid restriction ?Given chronic will not pursue aggressive correction until further workup completed  ?  Monitor repeat labs ?  ?Type 2 diabetes mellitus ([place]) ?History of hypoglycemia, Glc on admission 100  ?holding home meds for now ?Basal insulin + SSI while  inpatient ?  ?  ?Essential hypertension, benign ?BP fluctuating in ED, unknown if adherent to home med regimen ?Hold home meds for now ?Gentle IV fluids, no hx CHF and no clinical fluid overload ?  ?CKD (chronic kidney disease) stage 3, GFR 30-59 ml/min (HCC) ?Stable Cr/GFR ?Hold nephrotoxic rx as able  ?  ?Mixed hyperlipidemia ?Continue home statin ?  ?CAD (coronary artery disease) III ?Continue home beta blocker, statin, aspirin  ?Holding ACE/ARB and other BP meds d/t CKD and episodic hypotension ?2D echocardiogram with LVEF 70-75% and grade 1 diastolic dysfunction with mild LVH on 3/29 ?Discontinued HCTZ ?  ?IDA (iron deficiency anemia) ?Chronic ?Monitor routine CBC ?  ?GERD (gastroesophageal reflux disease) ?Continue PPI ?  ?  ?DVT prophylaxis: Heparin ?Code Status: Full ?Family Communication: Discussed with son on phone 3/30 ?Disposition Plan:  ?Status is: Inpatient ?Remains inpatient appropriate because: Continued need for IV medications. ?  ?  ?Nutritional Assessment: ?  ?The patient?s BMI is: Body mass index is 35.44 kg/m?Marland Kitchen. ?  ?Seen by dietician.  I agree with the assessment and plan as outlined below: ?  ?Nutrition Status: ?Nutrition Problem: Inadequate oral intake ?Etiology: acute illness (decreased appetite past 2 days) ?Signs/Symptoms: per patient/family report ?Interventions: Ensure Enlive (each supplement provides 350kcal and 20 grams of protein) ?   ?Consultants:  ?Nephrology ?  ?Procedures:  ?None ?  ?Antimicrobials:  ?Anti-infectives (From admission, onward)  ? ? Start     Dose/Rate Route Frequency Ordered Stop  ? 06/09/21 1830  ceFEPIme (MAXIPIME) 2 g in sodium chloride 0.9 % 100 mL IVPB  Status:  Discontinued       ? 2 g ?200 mL/hr over 30 Minutes Intravenous Every 12 hours 06/09/21 1802 06/10/21 0936  ? ?  ? ? ?Objective: ?Vitals:  ? 06/12/21 0423 06/12/21 0703 06/12/21 3086 06/12/21 0729  ?BP: (!) 156/55     ?Pulse: 64     ?Resp: 18     ?Temp: 98 ?F (36.7 ?C)     ?TempSrc: Oral     ?SpO2: 98%  97% 97% 97%  ?Weight:      ?Height:      ? ? ?Intake/Output Summary (Last 24 hours) at 06/12/2021 1227 ?Last data filed at 06/12/2021 0900 ?Gross per 24 hour  ?Intake 851.96 ml  ?Output 800 ml  ?Net 51.96 ml  ? ?Filed Weights  ? 06/09/21 1552 06/10/21 0456  ?Weight: 96.5 kg 96.6 kg  ? ? ? ?Physical Exam: ?  ?General:  AAO x 3,  cooperative, no distress;   ?HEENT:  Normocephalic, PERRL, otherwise with in Normal limits   ?Neuro:  CNII-XII intact. , normal motor and sensation, reflexes intact   ?Lungs:   Clear to auscultation BL, Respirations unlabored,  ?No wheezes / crackles  ?Cardio:    S1/S2, RRR, No murmure, No Rubs or Gallops   ?Abdomen:  Soft, non-tender, bowel sounds active all four quadrants, ?no guarding or peritoneal signs.  ?Muscular  ?skeletal:  Limited exam -global generalized weaknesses ?- in bed, able to move all 4 extremities,   ?2+ pulses,  symmetric, No pitting edema  ?Skin:  Dry, warm to touch, negative for any Rashes,  ?Wounds: Please see nursing documentation ?   ? ? ?  ? ?Data Reviewed: I have personally reviewed following labs and imaging studies ? ?CBC: ?Recent  Labs  ?Lab 06/09/21 ?1627 06/10/21 ?0430 06/11/21 ?0410 06/12/21 ?1856  ?WBC 18.4* 14.6* 18.5* 17.1*  ?HGB 11.9* 12.1 10.8* 11.1*  ?HCT 34.7* 36.4 32.6* 33.4*  ?MCV 92.0 93.8 93.1 94.9  ?PLT 355 325 325 322  ? ?Basic Metabolic Panel: ?Recent Labs  ?Lab 06/09/21 ?1627 06/10/21 ?0430 06/11/21 ?0410 06/11/21 ?1557 06/11/21 ?2205 06/12/21 ?3149  ?NA 122* 124* 124* 119* 122* 125*  ?K 3.5 4.1 4.1  --   --  4.5  ?CL 72* 78* 79*  --   --  79*  ?CO2 37* 34* 36*  --   --  38*  ?GLUCOSE 100* 182* 187*  --   --  252*  ?BUN 33* 38* 51*  --   --  51*  ?CREATININE 1.39* 1.43* 1.59*  --   --  1.62*  ?CALCIUM 8.4* 8.6* 8.6*  --   --  8.8*  ?MG  --   --  2.3  --   --  2.2  ? ?GFR: ?Estimated Creatinine Clearance: 29.7 mL/min (A) (by C-G formula based on SCr of 1.62 mg/dL (H)). ?Liver Function Tests: ?Recent Labs  ?Lab 06/09/21 ?1627  ?AST 21  ?ALT 18   ?ALKPHOS 84  ?BILITOT 0.6  ?PROT 6.4*  ?ALBUMIN 3.4*  ? ? ?CBG: ?Recent Labs  ?Lab 06/09/21 ?1617  ?GLUCAP 103*  ? ?Lipid Profile: ?No results for input(s): CHOL, HDL, LDLCALC, TRIG, CHOLHDL, LDLDIRECT in the last 72 hours. ?Terie Purser

## 2021-06-12 NOTE — Progress Notes (Signed)
Occupational Therapy Treatment ?Patient Details ?Name: Marie House ?MRN: [id number] ?DOB: 07-04-1934 ?Today's Date: 06/12/2021 ? ? ?History of present illness Marie House 87 y.o. female who presented to the emergency department 06/09/2021 from home via EMS, chief complaint altered mental status and respiratory distress with wheezing and shortness of breath, patient arrived in ED saturating 88 to 91% on room air. ?  ?OT comments ? Pt making incremental progress with OT goals this session. Overall, pt limited by weakness, balance deficits, and self limiting behavior. She refuses to work on mobility/exercises past short transfers to [facility], despite max encouragement and no apparent dyspnea. Requiring min guard to min A for limited tasks performed this session. Pt refusing SNF, family requesting max HH services. POC updated and acute OT will continue to follow.   ? ?Recommendations for follow up therapy are one component of a multi-disciplinary discharge planning process, led by the attending physician.  Recommendations may be updated based on patient status, additional functional criteria and insurance authorization. ?   ?Follow Up Recommendations ? Home health OT  ?  ?Assistance Recommended at Discharge Intermittent Supervision/Assistance  ?Patient can return home with the following ? A lot of help with walking and/or transfers;A lot of help with bathing/dressing/bathroom;Assistance with cooking/housework;Assist for transportation;Help with stairs or ramp for entrance ?  ?Equipment Recommendations ? None recommended by OT  ?  ?Recommendations for Other Services   ? ?  ?Precautions / Restrictions Precautions ?Precautions: Fall ?Restrictions ?Weight Bearing Restrictions: No  ? ? ?  ? ?Mobility Bed Mobility ?  ?  ?  ?  ?  ?  ?  ?General bed mobility comments: up in recliner on entry ?  ? ?Transfers ?Overall transfer level: Needs assistance ?Equipment used: Rolling walker (2 wheels) ?Transfers: Sit to/from Stand, Bed to  chair/wheelchair/BSC ?Sit to Stand: Min assist ?  ?  ?Step pivot transfers: Min assist, Min guard ?  ?  ?General transfer comment: Overall, pt is min A to power up intially, then she is able to complete transfer with min guard for safety. ?  ?  ?Balance Overall balance assessment: Needs assistance ?Sitting-balance support: Feet supported, No upper extremity supported ?Sitting balance-Leahy Scale: Fair ?  ?  ?Standing balance support: During functional activity, Bilateral upper extremity supported ?Standing balance-Leahy Scale: Poor ?Standing balance comment: fair/poor using RW ?  ?  ?  ?  ?  ?  ?  ?  ?  ?  ?  ?   ? ?ADL either performed or assessed with clinical judgement  ? ?ADL Overall ADL's : Needs assistance/impaired ?  ?  ?  ?  ?  ?  ?  ?  ?  ?  ?  ?  ?Toilet Transfer: Minimal assistance;Stand-pivot ?Toilet Transfer Details (indicate cue type and reason): Min A to initially power up, after that pt overall min gaurd for safety ?Toileting- Water quality scientist and Hygiene: Min guard;Sit to/from stand ?Toileting - Clothing Manipulation Details (indicate cue type and reason): completed from [facility] ?  ?  ?Functional mobility during ADLs: Minimal assistance;Rolling walker (2 wheels) ?General ADL Comments: Pt self limiting this session, she required less physicalassist to complete toileting, however continued to refuse further mobility, despite max encouragment and education on importance of mobility to safely discharge home. ?  ? ?Extremity/Trunk Assessment   ?  ?  ?  ?  ?  ? ?Vision   ?  ?  ?Perception   ?  ?Praxis   ?  ? ?Cognition Arousal/Alertness:  Awake/alert ?Behavior During Therapy: [facility] for tasks assessed/performed ?Overall Cognitive Status: Within Functional Limits for tasks assessed ?  ?  ?  ?  ?  ?  ?  ?  ?  ?  ?  ?  ?  ?  ?  ?  ?  ?  ?  ?   ?Exercises   ? ?  ?Shoulder Instructions   ? ? ?  ?General Comments VSS on 2L, no dyspnea with mobility.  ? ? ?Pertinent Vitals/ Pain       Pain Assessment ?Pain  Assessment: No/denies pain ? ?Home Living   ?  ?  ?  ?  ?  ?  ?  ?  ?  ?  ?  ?  ?  ?  ?  ?  ?  ?  ? ?  ?Prior Functioning/Environment    ?  ?  ?  ?   ? ?Frequency ? Min 2X/week  ? ? ? ? ?  ?Progress Toward Goals ? ?OT Goals(current goals can now be found in the care plan section) ? Progress towards OT goals: Progressing toward goals ? ?Acute Rehab OT Goals ?Patient Stated Goal: To go home ?OT Goal Formulation: With patient ?Time For Goal Achievement: 06/24/21 ?Potential to Achieve Goals: Good ?ADL Goals ?Pt Will Perform Grooming: standing;with min guard assist ?Pt Will Perform Upper Body Dressing: Independently;sitting ?Pt Will Perform Lower Body Dressing: with supervision;sitting/lateral leans;with adaptive equipment ?Pt Will Transfer to Toilet: with supervision;stand pivot transfer ?Pt Will Perform Toileting - Clothing Manipulation and hygiene: with min guard assist;sit to/from stand ?Pt/caregiver will Perform Home Exercise Program: Increased strength;Both right and left upper extremity;Independently  ?Plan Discharge plan needs to be updated;Frequency remains appropriate   ? ?Co-evaluation ? ? ?   ?  ?  ?  ?  ? ?  ?AM-PAC OT "6 Clicks" Daily Activity     ?Outcome Measure ? ? Help from another person eating meals?: A Little ?Help from another person taking care of personal grooming?: A Little ?Help from another person toileting, which includes using toliet, bedpan, or urinal?: A Little ?Help from another person bathing (including washing, rinsing, drying)?: A Lot ?Help from another person to put on and taking off regular upper body clothing?: A Little ?Help from another person to put on and taking off regular lower body clothing?: A Lot ?6 Click Score: 16 ? ?  ?End of Session Equipment Utilized During Treatment: Rolling walker (2 wheels);Oxygen ? ?OT Visit Diagnosis: Unsteadiness on feet (R26.81);Other abnormalities of gait and mobility (R26.89);Muscle weakness (generalized) (M62.81) ?  ?Activity Tolerance Patient  tolerated treatment well ?  ?Patient Left in chair;with call bell/phone within reach;with family/visitor present ?  ?Nurse Communication Mobility status ?  ? ?   ? ?Time: 5102-5852 ?OT Time Calculation (min): 23 min ? ?Charges: OT General Charges ?$OT Visit: 1 Visit ?OT Treatments ?$Self Care/Home Management : 23-37 mins ? ?Marie Whiting H., OTR/L ?Acute Rehabilitation ? ?Duaine Radin Elane Yolanda Bonine ?06/12/2021, 1:40 PM ?

## 2021-06-12 NOTE — TOC Progression Note (Signed)
Transition of Care (TOC) - Progression Note  ? ? ?Patient Details  ?Name: SAGAL GAYTON ?MRN: [id number] ?Date of Birth: 04-26-1934 ? ?Transition of Care (TOC) CM/SW Contact  ?Shade Flood, LCSW ?Phone Number: ?06/12/2021, 12:40 PM ? ?Clinical Narrative:    ? ?TOC following. MD anticipating possible dc home tomorrow. Pt/family continue to request HH at dc. Pt does not want to go to SNF. Pt has been referred to [facility] for [place] at dc and TOC provide update to [facility] with [facility] this AM about likely weekend dc. ? ?Pt has O2 at home already. Weekend TOC will be available to further assist with dc needs. ? ?Expected Discharge Plan: [place] ?Barriers to Discharge: Continued Medical Work up ? ?Expected Discharge Plan and Services ?Expected Discharge Plan: [place] ?In-house Referral: Clinical Social Work ?  ?  ?Living arrangements for the past 2 months: [place] ?                ?  ?  ?  ?  ?  ?HH Arranged: Therapist, sports, PT ?[place] Agency: [place] ([place]) ?Date HH Agency Contacted: 06/10/21 ?  ?Representative spoke with at [place]: Vaughan Basta ? ? ?Social Determinants of Health (SDOH) Interventions ?  ? ?Readmission Risk Interventions ? ?  06/10/2021  ?  9:07 AM  ?Readmission Risk Prevention Plan  ?Transportation Screening Complete  ?[place] or Home Care Consult Complete  ?Social Work Consult for [place] Planning/Counseling Complete  ?Palliative Care Screening Not Applicable  ?Medication Review Press photographer) Complete  ? ? ?

## 2021-06-12 NOTE — Progress Notes (Signed)
Physical Therapy Treatment ?Patient Details ?Name: Marie House ?MRN: [id number] ?DOB: 10-11-1934 ?Today's Date: 06/12/2021 ? ? ?History of Present Illness Marie House 87 y.o. female who presented to the emergency department 06/09/2021 from home via EMS, chief complaint altered mental status and respiratory distress with wheezing and shortness of breath, patient arrived in ED saturating 88 to 91% on room air. ? ?  ?PT Comments  ? ? Patient seated in chair at beginning of session. She was agreeable to compete seated exercises but declined further mobility today. Patient with quick fatigue with seated exercises requiring intermittent rest breaks. Patient will benefit from continued skilled physical therapy in hospital and recommended venue below to increase strength, balance, endurance for safe ADLs and gait. ?   ?Recommendations for follow up therapy are one component of a multi-disciplinary discharge planning process, led by the attending physician.  Recommendations may be updated based on patient status, additional functional criteria and insurance authorization. ? ?Follow Up Recommendations ? Skilled nursing-short term rehab (<3 hours/day) ?  ?  ?Assistance Recommended at Discharge Intermittent Supervision/Assistance  ?Patient can return home with the following A lot of help with bathing/dressing/bathroom;A lot of help with walking and/or transfers;Help with stairs or ramp for entrance;Assistance with cooking/housework ?  ?Equipment Recommendations ? None recommended by PT  ?  ?Recommendations for Other Services   ? ? ?  ?Precautions / Restrictions Precautions ?Precautions: Fall ?Restrictions ?Weight Bearing Restrictions: No  ?  ? ?Mobility ? Bed Mobility ?  ?  ?  ?  ?  ?  ?  ?  ?  ? ?Transfers ?  ?  ?  ?  ?  ?  ?  ?  ?  ?  ?  ? ?Ambulation/Gait ?  ?  ?  ?  ?  ?  ?  ?  ? ? ?Stairs ?  ?  ?  ?  ?  ? ? ?Wheelchair Mobility ?  ? ?Modified Rankin (Stroke Patients Only) ?  ? ? ?  ?Balance   ?Sitting-balance support: Feet  supported, No upper extremity supported ?Sitting balance-Leahy Scale: Fair ?Sitting balance - Comments: good/ fair seated edge of chair ?  ?  ?  ?  ?  ?  ?  ?  ?  ?  ?  ?  ?  ?  ?  ?  ? ?  ?Cognition Arousal/Alertness: Awake/alert ?Behavior During Therapy: [facility] for tasks assessed/performed ?Overall Cognitive Status: Within Functional Limits for tasks assessed ?  ?  ?  ?  ?  ?  ?  ?  ?  ?  ?  ?  ?  ?  ?  ?  ?  ?  ?  ? ?  ?Exercises General Exercises - Upper Extremity ?Shoulder Flexion: AROM, Both, 5 reps, Seated ?Elbow Flexion: AROM, Both, 10 reps, Seated ?Elbow Extension: AROM, Both, 10 reps, Seated ?General Exercises - Lower Extremity ?Long Arc Quad: AROM, Both, 10 reps, Seated ?Hip Flexion/Marching: AROM, Both, 10 reps, Seated ?Toe Raises: AROM, Both, 10 reps, Seated ?Heel Raises: AROM, Both, 10 reps, Seated ? ?  ?General Comments   ?  ?  ? ?Pertinent Vitals/Pain Pain Assessment ?Pain Assessment: No/denies pain  ? ? ?Home Living   ?  ?  ?  ?  ?  ?  ?  ?  ?  ?   ?  ?Prior Function    ?  ?  ?   ? ?PT Goals (current goals can now be found in  the care plan section) Acute Rehab PT Goals ?Patient Stated Goal: return home after rehab ?PT Goal Formulation: With patient ?Time For Goal Achievement: 06/24/21 ?Potential to Achieve Goals: Good ?Progress towards PT goals: Progressing toward goals ? ?  ?Frequency ? ? ? Min 3X/week ? ? ? ?  ?PT Plan Current plan remains appropriate  ? ? ?Co-evaluation   ?  ?  ?  ?  ? ?  ?AM-PAC PT "6 Clicks" Mobility   ?Outcome Measure ? Help needed turning from your back to your side while in a flat bed without using bedrails?: A Little ?Help needed moving from lying on your back to sitting on the side of a flat bed without using bedrails?: A Lot ?Help needed moving to and from a bed to a chair (including a wheelchair)?: A Lot ?Help needed standing up from a chair using your arms (e.g., wheelchair or bedside chair)?: A Lot ?Help needed to walk in hospital room?: A Lot ?Help needed climbing 3-5  steps with a railing? : A Lot ?6 Click Score: 13 ? ?  ?End of Session Equipment Utilized During Treatment: Oxygen ?Activity Tolerance: Patient tolerated treatment well;Patient limited by fatigue ?Patient left: in chair;with call bell/phone within reach ?Nurse Communication: Mobility status ?PT Visit Diagnosis: Unsteadiness on feet (R26.81);Other abnormalities of gait and mobility (R26.89);Muscle weakness (generalized) (M62.81) ?  ? ? ?Time: 7035-0093 ?PT Time Calculation (min) (ACUTE ONLY): 9 min ? ?Charges:  $Therapeutic Exercise: 8-22 mins          ?          ? ?10:53 AM, 06/12/21 ?Mearl Latin PT, DPT ?Physical Therapist at [facility] ?[facility] ? ? ?

## 2021-06-13 LAB — BASIC METABOLIC PANEL
Anion gap: 10 (ref 5–15)
BUN: 55 mg/dL — ABNORMAL HIGH (ref 8–23)
CO2: 37 mmol/L — ABNORMAL HIGH (ref 22–32)
Calcium: 8.8 mg/dL — ABNORMAL LOW (ref 8.9–10.3)
Chloride: 79 mmol/L — ABNORMAL LOW (ref 98–111)
Creatinine, Ser: 1.91 mg/dL — ABNORMAL HIGH (ref 0.44–1.00)
GFR, Estimated: 26 mL/min — ABNORMAL LOW (ref >60)
Glucose, Bld: 286 mg/dL — ABNORMAL HIGH (ref 70–99)
Potassium: 4.3 mmol/L (ref 3.5–5.1)
Sodium: 126 mmol/L — ABNORMAL LOW (ref 135–145)

## 2021-06-13 LAB — CORTISOL-AM, BLOOD: Cortisol - AM: 9.7 ug/dL (ref 6.7–22.6)

## 2021-06-13 MED ORDER — ACETAMINOPHEN 325 MG PO TABS
650.0000 mg | ORAL_TABLET | Freq: Four times a day (QID) | ORAL | Status: DC | PRN
  Administered 2021-06-13 – 2021-06-14 (×2): 650 mg via ORAL
  Filled 2021-06-13 (×2): qty 2

## 2021-06-13 MED ORDER — HYDRALAZINE HCL 25 MG PO TABS
50.0000 mg | ORAL_TABLET | Freq: Three times a day (TID) | ORAL | Status: DC
  Administered 2021-06-13 (×2): 50 mg via ORAL
  Filled 2021-06-13 (×2): qty 2

## 2021-06-13 MED ORDER — PREDNISONE 20 MG PO TABS
30.0000 mg | ORAL_TABLET | Freq: Every day | ORAL | Status: DC
  Administered 2021-06-14: 30 mg via ORAL
  Filled 2021-06-13: qty 2

## 2021-06-13 MED ORDER — LORAZEPAM 2 MG/ML IJ SOLN
0.5000 mg | Freq: Once | INTRAMUSCULAR | Status: AC
  Administered 2021-06-13: 0.5 mg via INTRAVENOUS
  Filled 2021-06-13: qty 1

## 2021-06-13 MED ORDER — SODIUM CHLORIDE 1 G PO TABS
1.0000 g | ORAL_TABLET | Freq: Three times a day (TID) | ORAL | Status: DC
  Administered 2021-06-13 – 2021-06-14 (×4): 1 g via ORAL
  Filled 2021-06-13 (×4): qty 1

## 2021-06-13 NOTE — Progress Notes (Signed)
?PROGRESS NOTE ? ? ? ?Marie House  KNL:[id number] DOB: 1934/06/22 DOA: 06/09/2021 ?PCP: Asencion Noble, MD ? ? ?Subjective: ? ?The patient was seen and examined this morning stable comfortable laying in bed in no acute distress no issues overnight ?Sodium 126 this morning, creatinine 1.91 ? ?Brief Narrative:  ?Per HPI: ?Patient presented to the emergency department 06/09/2021 from home via EMS, chief complaint altered mental status and respiratory distress with wheezing and shortness of breath, patient arrived in ED saturating 88 to 91% on room air.   ?ED course today 06/09/2021: Blood gas showed elevated PCO2 to 71, chest x-ray showed no acute processes or obvious infiltrate, sodium 122, creatinine around baseline at 1.3 and GFR 37, glucose 100, white blood cells elevated to 18, hemoglobin 11.9, stable from previous.  Patient received DuoNeb x2, Solu-Medrol, was placed on BiPAP. ? ?3/29: Patient was admitted with acute hypoxemic and hypercarbic respiratory failure secondary to COPD exacerbation with associated encephalopathy.  Patient was started on breathing treatments as well as some IV steroids.  She is overall improving in terms of her mentation and has been weaned off of BiPAP.  Hyponatremia is improving with discontinuation of HCTZ. ? ?3/30: Patient doing better from respiratory standpoint, however she continues to have some ongoing hyponatremia for which fluid restriction has been ordered as well as some IV normal saline.  It appears that she had some improvement with IV normal saline on last admission.  Continue to monitor a.m. labs.  PT recommending SNF on discharge, family refusing.  ? ? ?Assessment & Plan: ?  ?Principal Problem: ?  Acute respiratory failure with hypoxia and hypercarbia (HCC) Ddx includes COPD, obesity hypoventilation, viral. Respiratory fialure is likely cause of altered mental status / lethargy ?Active Problems: ?  Leukocytosis ?  Hyponatremia ?  Type 2 diabetes mellitus ([place]) ?  Mixed  hyperlipidemia ?  Essential hypertension, benign ?  CAD (coronary artery disease) ?  CKD (chronic kidney disease) stage 3, GFR 30-59 ml/min (HCC) ?  IDA (iron deficiency anemia) ?  GERD (gastroesophageal reflux disease) ?  Advance care planning ?  Acute respiratory failure ([place]) ? ?Assessment and Plan: ? ? ?Acute respiratory failure with hypoxia and hypercarbia (HCC) likely due to COPD, and is contirbuting to altered mental status / lethargy ?-Mentation much improved, back to baseline ? ? ?No apparent formal diagnosis COPD, was referred to pulmonary after last admission but has not gone yet, son states she has upcoming appointment scheduled 06/26/2021, son states she smoked cigarettes a long time ago, did have some secondhand smoke exposure, no other respiratory occupational exposures that he is aware of ?Recent ER visit treated for COPD exacerbation, failed outpatient treatment  ?In ED 06/09/21 received DuoNeb x3, SoluMedrol 125 mg, placed on BiPap, minimal improvement in mental status, now on nasal cannula ?Continue DuoNebs as needed for shortness of breath or wheezing ?Patient currently on prednisone and Pulmicort twice daily ?Respiratory PCR negative, recent negative for COVID/flu ?Cefepime discontinued 3/29 ?Improved on nasal cannula oxygen ?Tapering down oral steroids ?  ?  ?Hyponatremia ?Sodium 124, 119, 122 >>125  >> 126 now  ?Chronic, unclear if patient still taking HCTZ at home, this was held on her last hospital discharge  ?HCTZ >> discontinued now ?Trazodone >> discontinued now  ?Possible SIADH ?Serum Osm (264 - low), Urine Osm(  )Sodium, urine sodium (16) ?IV fluid with normal saline along with fluid restriction ?Given chronic will not pursue aggressive correction until further workup completed  ?Monitor repeat labs ?  ?  Type 2 diabetes mellitus ([place]) ?History of hypoglycemia, Glc on admission 100  ?holding home meds for now ?Basal insulin + SSI while inpatient ?  ?  ?Essential hypertension, benign ?BP  stable ?Discontinued home medication HCTZ, ?Nephrology discontinued IV fluid ? no hx CHF and no clinical fluid overload ?  ?CKD (chronic kidney disease) stage 3, GFR 30-59 ml/min (HCC) ?Stable Cr/GFR ?Hold nephrotoxic rx as able  ?Baseline creatinine 1.5-1.9 ?  ?Mixed hyperlipidemia ?Continue home statin ?  ?CAD (coronary artery disease) III ?Continue home beta blocker, statin, aspirin  ?Holding ACE/ARB and other BP meds d/t CKD and episodic hypotension ?2D echocardiogram with LVEF 70-75% and grade 1 diastolic dysfunction with mild LVH on 3/29 ?Discontinued HCTZ ?  ?IDA (iron deficiency anemia) ?Chronic ?Monitor routine CBC ?  ?GERD (gastroesophageal reflux disease) ?Continue PPI ?  ?  ?DVT prophylaxis: Heparin ?Code Status: Full ?Family Communication: Discussed with son on phone 3/30 ?Disposition Plan:  Home in 1-2 days  ?Status is: Inpatient ?Remains inpatient appropriate because: Pending improvement of serum sodium ?  ?  ?Nutritional Assessment: ? The patient?s BMI is: Body mass index is 35.44 kg/m?Marland Kitchen. ?  ?Seen by dietician.  I agree with the assessment and plan as outlined below: ?  ?Nutrition Status: ?Nutrition Problem: Inadequate oral intake ?Etiology: acute illness (decreased appetite past 2 days) ?Signs/Symptoms: per patient/family report ?Interventions: Ensure Enlive (each supplement provides 350kcal and 20 grams of protein) ?   ?Consultants:  ?Nephrology ?  ?Procedures:  ?None ?  ?Antimicrobials:  ?Anti-infectives (From admission, onward)  ? ? Start     Dose/Rate Route Frequency Ordered Stop  ? 06/09/21 1830  ceFEPIme (MAXIPIME) 2 g in sodium chloride 0.9 % 100 mL IVPB  Status:  Discontinued       ? 2 g ?200 mL/hr over 30 Minutes Intravenous Every 12 hours 06/09/21 1802 06/10/21 0936  ? ?  ? ? ?Objective: ?Vitals:  ? 06/13/21 0725 06/13/21 0730 06/13/21 0742 06/13/21 1328  ?BP:    (!) 146/50  ?Pulse:    68  ?Resp:    16  ?Temp:    97.9 ?F (36.6 ?C)  ?TempSrc:    Oral  ?SpO2: 96% 96% 96% 97%  ?Weight:       ?Height:      ? ? ?Intake/Output Summary (Last 24 hours) at 06/13/2021 1405 ?Last data filed at 06/13/2021 0900 ?Gross per 24 hour  ?Intake 860 ml  ?Output 400 ml  ?Net 460 ml  ? ?Filed Weights  ? 06/09/21 1552 06/10/21 0456  ?Weight: 96.5 kg 96.6 kg  ? ? ? ? ?Physical Exam: ?  ?General:  AAO x 3,  cooperative, no distress;   ?HEENT:  Normocephalic, PERRL, otherwise with in Normal limits   ?Neuro:  CNII-XII intact. , normal motor and sensation, reflexes intact   ?Lungs:   Clear to auscultation BL, Respirations unlabored,  ?No wheezes / crackles  ?Cardio:    S1/S2, RRR, No murmure, No Rubs or Gallops   ?Abdomen:  Soft, non-tender, bowel sounds active all four quadrants, ?no guarding or peritoneal signs.  ?Muscular  ?skeletal:  Limited exam -global generalized weaknesses ?- in bed, able to move all 4 extremities,   ?2+ pulses,  symmetric, No pitting edema  ?Skin:  Dry, warm to touch, negative for any Rashes,  ?Wounds: Please see nursing documentation ?   ? ? ?  ? ? ?Data Reviewed: I have personally reviewed following labs and imaging studies ? ?CBC: ?Recent Labs  ?Lab 06/09/21 ?  1627 06/10/21 ?0430 06/11/21 ?0410 06/12/21 ?0932  ?WBC 18.4* 14.6* 18.5* 17.1*  ?HGB 11.9* 12.1 10.8* 11.1*  ?HCT 34.7* 36.4 32.6* 33.4*  ?MCV 92.0 93.8 93.1 94.9  ?PLT 355 325 325 322  ? ?Basic Metabolic Panel: ?Recent Labs  ?Lab 06/09/21 ?1627 06/10/21 ?0430 06/11/21 ?0410 06/11/21 ?1557 06/11/21 ?2205 06/12/21 ?3557 06/13/21 ?3220  ?NA 122* 124* 124* 119* 122* 125* 126*  ?K 3.5 4.1 4.1  --   --  4.5 4.3  ?CL 72* 78* 79*  --   --  79* 79*  ?CO2 37* 34* 36*  --   --  38* 37*  ?GLUCOSE 100* 182* 187*  --   --  252* 286*  ?BUN 33* 38* 51*  --   --  51* 55*  ?CREATININE 1.39* 1.43* 1.59*  --   --  1.62* 1.91*  ?CALCIUM 8.4* 8.6* 8.6*  --   --  8.8* 8.8*  ?MG  --   --  2.3  --   --  2.2  --   ? ?GFR: ?Estimated Creatinine Clearance: 25.2 mL/min (A) (by C-G formula based on SCr of 1.91 mg/dL (H)). ?Liver Function Tests: ?Recent Labs  ?Lab  06/09/21 ?1627  ?AST 21  ?ALT 18  ?ALKPHOS 84  ?BILITOT 0.6  ?PROT 6.4*  ?ALBUMIN 3.4*  ? ? ?CBG: ?Recent Labs  ?Lab 06/09/21 ?1617  ?GLUCAP 103*  ? ?Lipid Profile: ?No results for input(s): CHOL, HDL, LDLCALC, TRIG, [place]

## 2021-06-13 NOTE — Plan of Care (Signed)
  Problem: Education: Goal: Knowledge of General Education information will improve Description Including pain rating scale, medication(s)/side effects and non-pharmacologic comfort measures Outcome: Progressing   Problem: Health Behavior/Discharge Planning: Goal: Ability to manage health-related needs will improve Outcome: Progressing   

## 2021-06-14 LAB — CBC
HCT: 31.8 % — ABNORMAL LOW (ref 36.0–46.0)
Hemoglobin: 10.2 g/dL — ABNORMAL LOW (ref 12.0–15.0)
MCH: 30.1 pg (ref 26.0–34.0)
MCHC: 32.1 g/dL (ref 30.0–36.0)
MCV: 93.8 fL (ref 80.0–100.0)
Platelets: 275 10*3/uL (ref 150–400)
RBC: 3.39 MIL/uL — ABNORMAL LOW (ref 3.87–5.11)
RDW: 13.1 % (ref 11.5–15.5)
WBC: 13.7 10*3/uL — ABNORMAL HIGH (ref 4.0–10.5)
nRBC: 0 % (ref 0.0–0.2)

## 2021-06-14 LAB — BASIC METABOLIC PANEL
Anion gap: 11 (ref 5–15)
BUN: 61 mg/dL — ABNORMAL HIGH (ref 8–23)
CO2: 35 mmol/L — ABNORMAL HIGH (ref 22–32)
Calcium: 8.7 mg/dL — ABNORMAL LOW (ref 8.9–10.3)
Chloride: 80 mmol/L — ABNORMAL LOW (ref 98–111)
Creatinine, Ser: 1.9 mg/dL — ABNORMAL HIGH (ref 0.44–1.00)
GFR, Estimated: 26 mL/min — ABNORMAL LOW (ref >60)
Glucose, Bld: 378 mg/dL — ABNORMAL HIGH (ref 70–99)
Potassium: 4.5 mmol/L (ref 3.5–5.1)
Sodium: 126 mmol/L — ABNORMAL LOW (ref 135–145)

## 2021-06-14 MED ORDER — HYDRALAZINE HCL 25 MG PO TABS
50.0000 mg | ORAL_TABLET | Freq: Three times a day (TID) | ORAL | Status: DC
  Administered 2021-06-14 – 2021-06-15 (×3): 50 mg via ORAL
  Filled 2021-06-14 (×3): qty 2

## 2021-06-14 MED ORDER — PREDNISONE 20 MG PO TABS
20.0000 mg | ORAL_TABLET | Freq: Every day | ORAL | Status: DC
  Administered 2021-06-15: 20 mg via ORAL
  Filled 2021-06-14: qty 1

## 2021-06-14 MED ORDER — INSULIN DETEMIR 100 UNIT/ML ~~LOC~~ SOLN
20.0000 [IU] | Freq: Every day | SUBCUTANEOUS | Status: DC
  Administered 2021-06-14 – 2021-06-15 (×2): 20 [IU] via SUBCUTANEOUS
  Filled 2021-06-14 (×3): qty 0.2

## 2021-06-14 NOTE — Plan of Care (Signed)
  Problem: Education: Goal: Knowledge of General Education information will improve Description: Including pain rating scale, medication(s)/side effects and non-pharmacologic comfort measures Outcome: Progressing   Problem: Health Behavior/Discharge Planning: Goal: Ability to manage health-related needs will improve Outcome: Progressing   Problem: Clinical Measurements: Goal: Will remain free from infection Outcome: Progressing   

## 2021-06-14 NOTE — Progress Notes (Signed)
?PROGRESS NOTE ? ? ? ?Marie House  QVZ:[id number] DOB: March 12, 1935 DOA: 06/09/2021 ?PCP: Asencion Noble, MD ? ? ?Subjective: ? ?The patient was seen and examined this morning, stable sitting in chair having breakfast, on 2.5 L of oxygen, satting 97% ?No issues overnight ?Sodium level still 126 ? ?Brief Narrative:  ?Per HPI: ?Patient presented to the emergency department 06/09/2021 from home via EMS, chief complaint altered mental status and respiratory distress with wheezing and shortness of breath, patient arrived in ED saturating 88 to 91% on room air.   ?ED course today 06/09/2021: Blood gas showed elevated PCO2 to 71, chest x-ray showed no acute processes or obvious infiltrate, sodium 122, creatinine around baseline at 1.3 and GFR 37, glucose 100, white blood cells elevated to 18, hemoglobin 11.9, stable from previous.  Patient received DuoNeb x2, Solu-Medrol, was placed on BiPAP. ? ?3/29: Patient was admitted with acute hypoxemic and hypercarbic respiratory failure secondary to COPD exacerbation with associated encephalopathy.  Patient was started on breathing treatments as well as some IV steroids.  She is overall improving in terms of her mentation and has been weaned off of BiPAP.  Hyponatremia is improving with discontinuation of HCTZ. ? ?3/30: Patient doing better from respiratory standpoint, however she continues to have some ongoing hyponatremia for which fluid restriction has been ordered as well as some IV normal saline.  It appears that she had some improvement with IV normal saline on last admission.  Continue to monitor a.m. labs.  PT recommending SNF on discharge, family refusing.  ? ? ?Assessment & Plan: ?  ?Principal Problem: ?  Acute respiratory failure with hypoxia and hypercarbia (HCC) Ddx includes COPD, obesity hypoventilation, viral. Respiratory fialure is likely cause of altered mental status / lethargy ?Active Problems: ?  Leukocytosis ?  Hyponatremia ?  Type 2 diabetes mellitus ([place]) ?  Mixed  hyperlipidemia ?  Essential hypertension, benign ?  CAD (coronary artery disease) ?  CKD (chronic kidney disease) stage 3, GFR 30-59 ml/min (HCC) ?  IDA (iron deficiency anemia) ?  GERD (gastroesophageal reflux disease) ?  Advance care planning ?  Acute respiratory failure ([place]) ? ?Assessment and Plan: ? ? ?Acute respiratory failure with hypoxia and hypercarbia (HCC) likely due to COPD, and is contirbuting to altered mental status / lethargy ?-Mentation back to baseline ?-Back to baseline no respiratory distress, at 2.5 L of O2 via nasal cannula satting 97% ? ? ?No apparent formal diagnosis COPD, was referred to pulmonary after last admission but has not gone yet, son states she has upcoming appointment scheduled 06/26/2021, son states she smoked cigarettes a long time ago, did have some secondhand smoke exposure, no other respiratory occupational exposures that he is aware of ?Recent ER visit treated for COPD exacerbation, failed outpatient treatment  ?In ED 06/09/21 received DuoNeb x3, SoluMedrol 125 mg, placed on BiPap, minimal improvement in mental status, now on nasal cannula ?Continue DuoNebs as needed for shortness of breath or wheezing ?Patient currently on prednisone and Pulmicort twice daily ?Respiratory PCR negative, recent negative for COVID/flu ?Cefepime discontinued 3/29 ?Improved on nasal cannula oxygen ?Tapering down oral steroids ?  ?  ?Hyponatremia ?Sodium 124, 119, 122 >>125  >> 126, 126 >>>   ?-Sodium chloride 1 g p.o. 3 times daily initiated 06/13/2021 ? ?Chronic, unclear if patient still taking HCTZ at home, this was held on her last hospital discharge  ?HCTZ >> discontinued now ?Trazodone >> discontinued now  ?? SIADH ?Serum Osm (264 - low), Urine Osm(  )Sodium,  urine sodium (16) ?A.m. cortisol level 9.7-normal ?Nephrology discontinued IV fluid ?Given chronic will not pursue aggressive correction until further workup completed  ?Monitor repeat labs ?  ?Type 2 diabetes mellitus ([place]) ?History of  hypoglycemia, Glc on admission 100  ?holding home meds for now ?Blood sugar improved, elevated ?Increasing basal insulin 20 units every 8 daily ?Basal insulin + SSI while inpatient ?  ?  ?Essential hypertension, benign ?BP stable ?Discontinued home medication HCTZ, ?Added hydralazine 50 3 times daily ?(Could not add calcium channel blocker, amlodipine due to allergies) ?Nephrology discontinued IV fluid ? no hx CHF and no clinical fluid overload ?  ?CKD (chronic kidney disease) stage 3, GFR 30-59 ml/min (HCC) ?Stable Cr/GFR ?Hold nephrotoxic rx as able  ?Baseline creatinine 1.5-1.9  ?  ?Mixed hyperlipidemia ?Continue home statin ?  ?CAD (coronary artery disease) III ?Continue home beta blocker, statin, aspirin  ?Holding ACE/ARB and other BP meds d/t CKD and episodic hypotension ?2D echocardiogram with LVEF 70-75% and grade 1 diastolic dysfunction with mild LVH on 3/29 ?Discontinued HCTZ ?  ?IDA (iron deficiency anemia) ?Chronic ?Monitor routine CBC ?  ?GERD (gastroesophageal reflux disease) ?Continue PPI ?  ?  ?DVT prophylaxis: Heparin ?Code Status: Full ?Family Communication: Discussed with son on phone 3/30 ?Disposition Plan:  Home with home health in a.m. ? ? ?Status is: Inpatient ?Remains inpatient appropriate because: Pending improvement of serum sodium ?  ?  ?Nutritional Assessment: ? The patient?s BMI is: Body mass index is 35.44 kg/m?Marland Kitchen. ?  ?Seen by dietician.  I agree with the assessment and plan as outlined below: ?  ?Nutrition Status: ?Nutrition Problem: Inadequate oral intake ?Etiology: acute illness (decreased appetite past 2 days) ?Signs/Symptoms: per patient/family report ?Interventions: Ensure Enlive (each supplement provides 350kcal and 20 grams of protein) ?   ?Consultants:  ?Nephrology ?  ?Procedures:  ?None ?  ?Antimicrobials:  ?Anti-infectives (From admission, onward)  ? ? Start     Dose/Rate Route Frequency Ordered Stop  ? 06/09/21 1830  ceFEPIme (MAXIPIME) 2 g in sodium chloride 0.9 % 100 mL  IVPB  Status:  Discontinued       ? 2 g ?200 mL/hr over 30 Minutes Intravenous Every 12 hours 06/09/21 1802 06/10/21 0936  ? ?  ? ? ?Objective: ?Vitals:  ? 06/14/21 0544 06/14/21 0825 06/14/21 0830 06/14/21 0838  ?BP: (!) 167/48     ?Pulse: 67     ?Resp: 18     ?Temp: 98.2 ?F (36.8 ?C)     ?TempSrc:      ?SpO2: 98% 97% 97% 97%  ?Weight:      ?Height:      ? ? ?Intake/Output Summary (Last 24 hours) at 06/14/2021 1018 ?Last data filed at 06/14/2021 0900 ?Gross per 24 hour  ?Intake 980 ml  ?Output 1700 ml  ?Net -720 ml  ? ?Filed Weights  ? 06/09/21 1552 06/10/21 0456  ?Weight: 96.5 kg 96.6 kg  ? ? ? ? ? ? ?Physical Exam: ?  ?General:  AAO x 3,  cooperative, no distress;   ?HEENT:  Normocephalic, PERRL, otherwise with in Normal limits   ?Neuro:  CNII-XII intact. , normal motor and sensation, reflexes intact   ?Lungs:   Clear to auscultation BL, Respirations unlabored,  ?No wheezes / crackles  ?Cardio:    S1/S2, RRR, No murmure, No Rubs or Gallops   ?Abdomen:  Soft, non-tender, bowel sounds active all four quadrants, ?no guarding or peritoneal signs.  ?Muscular  ?skeletal:  Limited exam -global generalized weaknesses ?-  in bed, able to move all 4 extremities,   ?2+ pulses,  symmetric, No pitting edema  ?Skin:  Dry, warm to touch, negative for any Rashes,  ?Wounds: Please see nursing documentation ?   ? ? ?  ? ? ?Data Reviewed: I have personally reviewed following labs and imaging studies ? ?CBC: ?Recent Labs  ?Lab 06/09/21 ?1627 06/10/21 ?0430 06/11/21 ?0410 06/12/21 ?6301 06/14/21 ?0415  ?WBC 18.4* 14.6* 18.5* 17.1* 13.7*  ?HGB 11.9* 12.1 10.8* 11.1* 10.2*  ?HCT 34.7* 36.4 32.6* 33.4* 31.8*  ?MCV 92.0 93.8 93.1 94.9 93.8  ?PLT 355 325 325 322 275  ? ?Basic Metabolic Panel: ?Recent Labs  ?Lab 06/10/21 ?0430 06/11/21 ?0410 06/11/21 ?1557 06/11/21 ?2205 06/12/21 ?6010 06/13/21 ?9323 06/14/21 ?0415  ?NA 124* 124* 119* 122* 125* 126* 126*  ?K 4.1 4.1  --   --  4.5 4.3 4.5  ?CL 78* 79*  --   --  79* 79* 80*  ?CO2 34* 36*  --   --   38* 37* 35*  ?GLUCOSE 182* 187*  --   --  252* 286* 378*  ?BUN 38* 51*  --   --  51* 55* 61*  ?CREATININE 1.43* 1.59*  --   --  1.62* 1.91* 1.90*  ?CALCIUM 8.6* 8.6*  --   --  8.8* 8.8* 8.7*  ?MG  --  2.3

## 2021-06-14 NOTE — Progress Notes (Signed)
Patient OOB to chair most of the shift and ambulated as ordered. ?

## 2021-06-15 LAB — CBC
HCT: 31.8 % — ABNORMAL LOW (ref 36.0–46.0)
Hemoglobin: 10.1 g/dL — ABNORMAL LOW (ref 12.0–15.0)
MCH: 30.2 pg (ref 26.0–34.0)
MCHC: 31.8 g/dL (ref 30.0–36.0)
MCV: 95.2 fL (ref 80.0–100.0)
Platelets: 263 10*3/uL (ref 150–400)
RBC: 3.34 MIL/uL — ABNORMAL LOW (ref 3.87–5.11)
RDW: 13.1 % (ref 11.5–15.5)
WBC: 13.3 10*3/uL — ABNORMAL HIGH (ref 4.0–10.5)
nRBC: 0 % (ref 0.0–0.2)

## 2021-06-15 LAB — BASIC METABOLIC PANEL
Anion gap: 10 (ref 5–15)
BUN: 58 mg/dL — ABNORMAL HIGH (ref 8–23)
CO2: 35 mmol/L — ABNORMAL HIGH (ref 22–32)
Calcium: 8.9 mg/dL (ref 8.9–10.3)
Chloride: 84 mmol/L — ABNORMAL LOW (ref 98–111)
Creatinine, Ser: 1.87 mg/dL — ABNORMAL HIGH (ref 0.44–1.00)
GFR, Estimated: 26 mL/min — ABNORMAL LOW (ref >60)
Glucose, Bld: 328 mg/dL — ABNORMAL HIGH (ref 70–99)
Potassium: 4.7 mmol/L (ref 3.5–5.1)
Sodium: 129 mmol/L — ABNORMAL LOW (ref 135–145)

## 2021-06-15 MED ORDER — PRAVASTATIN SODIUM 80 MG PO TABS: 40.0000 mg | ORAL_TABLET | Freq: Every day | ORAL | 1 refills | Status: AC

## 2021-06-15 MED ORDER — DM-GUAIFENESIN ER 30-600 MG PO TB12
1.0000 | ORAL_TABLET | Freq: Two times a day (BID) | ORAL | 0 refills | Status: AC
Start: 2021-06-15 — End: 2021-06-20

## 2021-06-15 MED ORDER — HYDRALAZINE HCL 50 MG PO TABS: 50.0000 mg | ORAL_TABLET | Freq: Three times a day (TID) | ORAL | 1 refills | Status: AC

## 2021-06-15 MED ORDER — PREDNISONE 20 MG PO TABS: 10.0000 mg | ORAL_TABLET | Freq: Every day | ORAL | 0 refills | Status: AC

## 2021-06-15 NOTE — Progress Notes (Signed)
Subjective:  No major issues overnight-  at least 900 of UOP- CKD stable with crt 1.87-  sodium actually up to 129-  plan is for discharge today ? ?Objective ?Vital signs in last 24 hours: ?Vitals:  ? 06/15/21 0515 06/15/21 0750 06/15/21 0757 06/15/21 0812  ?BP: (!) 140/54     ?Pulse: 73     ?Resp: 20     ?Temp: 98.1 ?F (36.7 ?C)     ?TempSrc: Oral     ?SpO2: 100% 99% 99% 99%  ?Weight:      ?Height:      ? ?Weight change:  ? ?Intake/Output Summary (Last 24 hours) at 06/15/2021 0823 ?Last data filed at 06/15/2021 3016 ?Gross per 24 hour  ?Intake 640 ml  ?Output 900 ml  ?Net -260 ml  ? ? ?Assessment/ Plan: ?Pt is a 87 y.o. yo female with COPD who was admitted on 06/09/2021 with altered MS and sodium down to 122 ?Assessment/Plan: ?1. Hyponatremia-  urine osm suggestive of SIADH-  was on HCTZ and trazadone which were held-  then put on fluid restriction-  sodium has trended up- would continue to hold these meds as OP ? ?Sometimes a low dose loop diuretic is used in this scenario to keep urine dilute-  I would not necessarily do it now since improving but in case it starts to drift down again  ? ?2. CKD-  stable  ?3. Anemia- not an issue  ?4.  Dispo-  primary tells me she is for discharge today  ? ? ?Louis Meckel  ? ? ?Labs: ?Basic Metabolic Panel: ?Recent Labs  ?Lab 06/13/21 ?0109 06/14/21 ?3235 06/15/21 ?0426  ?NA 126* 126* 129*  ?K 4.3 4.5 4.7  ?CL 79* 80* 84*  ?CO2 37* 35* 35*  ?GLUCOSE 286* 378* 328*  ?BUN 55* 61* 58*  ?CREATININE 1.91* 1.90* 1.87*  ?CALCIUM 8.8* 8.7* 8.9  ? ?Liver Function Tests: ?Recent Labs  ?Lab 06/09/21 ?1627  ?AST 21  ?ALT 18  ?ALKPHOS 84  ?BILITOT 0.6  ?PROT 6.4*  ?ALBUMIN 3.4*  ? ?No results for input(s): LIPASE, AMYLASE in the last 168 hours. ?No results for input(s): AMMONIA in the last 168 hours. ?CBC: ?Recent Labs  ?Lab 06/10/21 ?0430 06/11/21 ?0410 06/12/21 ?5732 06/14/21 ?2025 06/15/21 ?0426  ?WBC 14.6* 18.5* 17.1* 13.7* 13.3*  ?HGB 12.1 10.8* 11.1* 10.2* 10.1*  ?HCT 36.4 32.6*  33.4* 31.8* 31.8*  ?MCV 93.8 93.1 94.9 93.8 95.2  ?PLT 325 325 322 275 263  ? ?Cardiac Enzymes: ?No results for input(s): CKTOTAL, CKMB, CKMBINDEX, TROPONINI in the last 168 hours. ?CBG: ?Recent Labs  ?Lab 06/09/21 ?1617  ?GLUCAP 103*  ? ? ?Iron Studies: No results for input(s): IRON, TIBC, TRANSFERRIN, FERRITIN in the last 72 hours. ?Studies/Results: ?No results found. ?Medications: ?Infusions: ? ? ?Scheduled Medications: ? aspirin EC  81 mg Oral Daily  ? budesonide (PULMICORT) nebulizer solution  0.5 mg Nebulization Q12H  ? carvedilol  12.5 mg Oral BID WC  ? dextromethorphan-guaiFENesin  1 tablet Oral BID  ? feeding supplement  237 mL Oral BID BM  ? heparin  5,000 Units Subcutaneous Q8H  ? hydrALAZINE  50 mg Oral Q8H  ? insulin detemir  20 Units Subcutaneous Daily  ? ipratropium-albuterol  3 mL Nebulization BID  ? pravastatin  80 mg Oral Daily  ? predniSONE  20 mg Oral Q breakfast  ? sodium chloride flush  3 mL Intravenous Q12H  ? umeclidinium-vilanterol  1 puff Inhalation Daily  ? ? have reviewed  scheduled and prn medications. ? ?Physical Exam: ?General: NAD-  eating breakfast ?Heart: RRR ?Lungs: mostly clear ?Abdomen: obese, soft, non tender ?Extremities: no edema ? ? ? ?06/15/2021,8:23 AM ? LOS: 6 days  ? ?  ? ? ? ? ?

## 2021-06-15 NOTE — Discharge Summary (Signed)
?Physician Discharge Summary ?  ?Patient: Marie House MRN: [id number] DOB: 1934-07-16  ?Admit date:     06/09/2021  ?Discharge date: 06/15/21  ?Discharge Physician: Valeria Batman Demarr Kluever  ? ?PCP: Asencion Noble, MD  ? ?Recommendations at discharge:  ? ?Follow-up with PCP within 1 week ?Recommending repeating BMP, to monitor sodium level today 129 ?Home medication of trazodone and HCTZ was discontinued due to hyponatremia ?To continue supplemental oxygen especially with exertion ?PT OT, ambulation with fall precaution recommended ? ? ?Discharge Diagnoses: ?Principal Problem: ?  Acute respiratory failure with hypoxia and hypercarbia (HCC) Ddx includes COPD, obesity hypoventilation, viral. Respiratory fialure is likely cause of altered mental status / lethargy ?Active Problems: ?  Leukocytosis ?  Hyponatremia ?  Type 2 diabetes mellitus ([place]) ?  Mixed hyperlipidemia ?  Essential hypertension, benign ?  CAD (coronary artery disease) ?  CKD (chronic kidney disease) stage 3, GFR 30-59 ml/min (HCC) ?  IDA (iron deficiency anemia) ?  GERD (gastroesophageal reflux disease) ?  Advance care planning ?  Acute respiratory failure ([place]) ? ?Resolved Problems: ?  Acute respiratory failure ([place]) ? ?Hospital Course: ?Per HPI: ?Patient presented to the emergency department 06/09/2021 from home via EMS, chief complaint altered mental status and respiratory distress with wheezing and shortness of breath, patient arrived in ED saturating 88 to 91% on room air.   ?ED course today 06/09/2021: Blood gas showed elevated PCO2 to 71, chest x-ray showed no acute processes or obvious infiltrate, sodium 122, creatinine around baseline at 1.3 and GFR 37, glucose 100, white blood cells elevated to 18, hemoglobin 11.9, stable from previous.  Patient received DuoNeb x2, Solu-Medrol, was placed on BiPAP. ? ?3/29: Patient was admitted with acute hypoxemic and hypercarbic respiratory failure secondary to COPD exacerbation with associated encephalopathy.  Patient  was started on breathing treatments as well as some IV steroids.  She is overall improving in terms of her mentation and has been weaned off of BiPAP.  Hyponatremia is improving with discontinuation of HCTZ. ? ?3/30: Patient doing better from respiratory standpoint, however she continues to have some ongoing hyponatremia for which fluid restriction has been ordered as well as some IV normal saline.  It appears that she had some improvement with IV normal saline on last admission.  Continue to monitor a.m. labs.  PT recommending SNF on discharge, family refusing. ? ?Assessment and Plan: ?* Acute respiratory failure with hypoxia and hypercarbia (HCC) Ddx includes COPD, obesity hypoventilation, viral. Respiratory fialure is likely cause of altered mental status / lethargy ?Likely contirbuting to altered mental status ?No apparent forma diagnosis COPD, was referred to pulmonary after last admission but has not gone yet, son states she has upcoming appointment scheduled 06/26/2021, son states she smoked cigarettes a long time ago, did have some secondhand smoke exposure, no other respiratory occupational exposures that he is aware of ?Recent ER visit treated for COPD exacerbation, failed outpatient treatment  ?In ED 06/09/21 received DuoNeb x3, SoluMedrol 125 mg, placed on BiPap, minimal improvement in mental status ?Continue BiPap, repeat blood gas later this evening / tomorrow AM ?Continue DuoNeb q2-4 hours ?Respiratory PCR pending, recent negative for COVID/flu ?Will add antibiotics given severity of respiratory status and recent admission for similar symptoms, may be able to de-escalate if viral cause is noted on respiratory PCR ?Consider pulmonary consult while inpatient given readmission risk and unclear COPD diagnosis vs other  ? ?Leukocytosis ?Likely d/t recent steroids ?Monitor routine CBC ?Checking procalcitonin, UA - nothing to suggest overt bacterial  infection at this time would be contributing to  AMS ? ?Hyponatremia ?Chronic, unclear if patient still taking HCTZ at home, this was held on her last hospital discharge  ?Hold HCTZ ?Add on Serum Osm, Urine Osm/Sodium ?Given chronic will not pursue aggressive correction until further workup completed  ? ?Type 2 diabetes mellitus ([place]) ?History of hypoglycemia, Glc on admission 100  ?holding home meds for now ?Basal insulin + SSI while inpatient ? ? ?Mixed hyperlipidemia ?Continue home statin ? ?Essential hypertension, benign ?BP fluctuating in ED, unknown if adherent to home med regimen ?Hold home meds for now ?Gentle IV fluids, no hx CHF and no clinical fluid overload ? ?CAD (coronary artery disease) ?Continue home beta blocker, statin, aspirin  ?Holding ACE/ARB and other BP meds d/t CKD and episodic hypotension ? ?Advance care planning ?Patient unable to participate in meaningful discussion regarding CODE STATUS given her altered mental status/delirium.  She has no advanced directive on file.  Spoke at length with patient's son, Roselind Rily.  He confirmed that he is her only child, she was married but her husband is deceased.  I updated [name] on [name], plan of care, prognosis is fair to guarded and she is at risk of further decompensation, but she is not critical or terminal at this time.  [name] confirmed that patient does not have formal COPD diagnosis or other major respiratory risk factors known.  He states that she would not want to pursue CPR if her heart were to stop, he states she would be amenable to intubation/mechanical ventilation if her respiratory status worsens.  Patient is limited code, orders updated to include no CPR, defibrillation, ACLS medications however invasive mechanical ventilation would be acceptable.  Recommend completion of advance directive prior to discharge home  ? ?GERD (gastroesophageal reflux disease) ?Continue PPI ? ?IDA (iron deficiency anemia) ?Chronic, stable ?Monitor routine CBC ? ?CKD  (chronic kidney disease) stage 3, GFR 30-59 ml/min (HCC) ?Stable Cr/GFR ?Hold nephrotoxic rx as able  ? ? ? ? ?  ? ? ?Procedures performed: none  ?Disposition: Home health ?Diet recommendation:  ?Discharge Diet Orders (From admission, onward)  ? ?  Start     Ordered  ? 06/15/21 0000  Diet general       ? 06/15/21 0850  ? ?  ?  ? ?  ? ?Regular diet ?DISCHARGE MEDICATION: ?Allergies as of 06/15/2021   ? ?   Reactions  ? Altace [ramipril]   ? Atacand [candesartan]   ? Avandia [rosiglitazone]   ? Crestor [rosuvastatin]   ? Diovan [valsartan]   ? Lisinopril   ? Lotensin [benazepril]   ? Norvasc [amlodipine Besylate]   ? Zetia [ezetimibe]   ? ?  ? ?  ?Medication List  ?  ? ?STOP taking these medications   ? ?benzonatate 200 MG capsule ?Commonly known as: TESSALON ?  ?hydrochlorothiazide 25 MG tablet ?Commonly known as: HYDRODIURIL ?  ?HYDROcodone-acetaminophen 5-325 MG tablet ?Commonly known as: NORCO/VICODIN ?  ?omeprazole 20 MG capsule ?Commonly known as: PRILOSEC ?  ?traZODone 50 MG tablet ?Commonly known as: DESYREL ?  ? ?  ? ?TAKE these medications   ? ?ALPRAZolam 0.5 MG tablet ?Commonly known as: Duanne Moron ?Take 0.5 mg by mouth 3 (three) times daily as needed for anxiety. ?  ?aspirin EC 81 MG tablet ?Take 81 mg by mouth daily. ?  ?Basaglar KwikPen 100 UNIT/ML ?Inject 25 Units into the skin 2 (two) times daily. Use a reduced dose for now,  follow up with Dr. Willey Blade to discuss whether you need to adjust your insulin ?  ?carvedilol 12.5 MG tablet ?Commonly known as: COREG ?Take 12.5 mg by mouth 2 (two) times daily with a meal. ?  ?CENTRUM SILVER PO ?Take 1 tablet by mouth daily with breakfast. ?  ?dextromethorphan-guaiFENesin 30-600 MG 12hr tablet ?Commonly known as: [place] DM ?Take 1 tablet by mouth 2 (two) times daily for 5 days. ?  ?Dilt-XR 120 MG 24 hr capsule ?Generic drug: diltiazem ?Take 120 mg by mouth daily. ?  ?HumaLOG KwikPen 100 UNIT/ML KwikPen ?Generic drug: insulin lispro ?Inject 7 Units into the skin 2 (two)  times daily before a meal. Follow up with Dr. Willey Blade to see if this needs to be adjusted further. ?  ?hydrALAZINE 50 MG tablet ?Commonly known as: APRESOLINE ?Take 1 tablet (50 mg total) by mouth every 8 (eight) hours. ?

## 2021-06-15 NOTE — TOC Transition Note (Signed)
Transition of Care (TOC) - CM/SW Discharge Note ? ? ?Patient Details  ?Name: Marie House ?MRN: [id number] ?Date of Birth: October 06, 1934 ? ?Transition of Care (TOC) CM/SW Contact:  ?Kerin Salen, RN ?Phone Number: ?06/15/2021, 12:55 PM ? ? ? ?Final next level of care: [place] ?Barriers to Discharge: Barriers Resolved ? ? ?Patient Goals and CMS Choice ?Patient states their goals for this hospitalization and ongoing recovery are:: return home ?CMS Medicare.gov Compare Post Acute Care list provided to:: Patient Represenative (must comment) ?Choice offered to / list presented to : Patient, Sibling ? ?Discharge Placement ?  ?           ?  ?  ?  ?Patient and family notified of of transfer: 06/15/21 ? ?Discharge Plan and Services ?In-house Referral: Clinical Social Work ?  ?           ?DME Arranged: N/A ?DME Agency: NA ?  ?  ?  ?HH Arranged: PT, OT ?[facility] Agency: [place] [facility] ?Date HH Agency Contacted: 06/14/21 ?  ?Representative spoke with at [place]: Vaughan Basta ? ?Social Determinants of Health (SDOH) Interventions ?  ? ? ?Readmission Risk Interventions ? ?  06/10/2021  ?  9:07 AM  ?Readmission Risk Prevention Plan  ?Transportation Screening Complete  ?[place] or Home Care Consult Complete  ?Social Work Consult for [place] Planning/Counseling Complete  ?Palliative Care Screening Not Applicable  ?Medication Review Press photographer) Complete  ? ? ? ? ? ?

## 2021-06-16 DIAGNOSIS — Z7982 Long term (current) use of aspirin: Secondary | ICD-10-CM | POA: Diagnosis not present

## 2021-06-16 DIAGNOSIS — D509 Iron deficiency anemia, unspecified: Secondary | ICD-10-CM | POA: Diagnosis not present

## 2021-06-16 DIAGNOSIS — J441 Chronic obstructive pulmonary disease with (acute) exacerbation: Secondary | ICD-10-CM | POA: Diagnosis not present

## 2021-06-16 DIAGNOSIS — Z7951 Long term (current) use of inhaled steroids: Secondary | ICD-10-CM | POA: Diagnosis not present

## 2021-06-16 DIAGNOSIS — G4700 Insomnia, unspecified: Secondary | ICD-10-CM | POA: Diagnosis not present

## 2021-06-16 DIAGNOSIS — J9621 Acute and chronic respiratory failure with hypoxia: Secondary | ICD-10-CM | POA: Diagnosis not present

## 2021-06-16 DIAGNOSIS — E871 Hypo-osmolality and hyponatremia: Secondary | ICD-10-CM | POA: Diagnosis not present

## 2021-06-16 DIAGNOSIS — E782 Mixed hyperlipidemia: Secondary | ICD-10-CM | POA: Diagnosis not present

## 2021-06-16 DIAGNOSIS — Z794 Long term (current) use of insulin: Secondary | ICD-10-CM | POA: Diagnosis not present

## 2021-06-16 DIAGNOSIS — I129 Hypertensive chronic kidney disease with stage 1 through stage 4 chronic kidney disease, or unspecified chronic kidney disease: Secondary | ICD-10-CM | POA: Diagnosis not present

## 2021-06-16 DIAGNOSIS — M1990 Unspecified osteoarthritis, unspecified site: Secondary | ICD-10-CM | POA: Diagnosis not present

## 2021-06-16 DIAGNOSIS — Z9981 Dependence on supplemental oxygen: Secondary | ICD-10-CM | POA: Diagnosis not present

## 2021-06-16 DIAGNOSIS — J9622 Acute and chronic respiratory failure with hypercapnia: Secondary | ICD-10-CM | POA: Diagnosis not present

## 2021-06-16 DIAGNOSIS — E1122 Type 2 diabetes mellitus with diabetic chronic kidney disease: Secondary | ICD-10-CM | POA: Diagnosis not present

## 2021-06-16 DIAGNOSIS — K219 Gastro-esophageal reflux disease without esophagitis: Secondary | ICD-10-CM | POA: Diagnosis not present

## 2021-06-16 DIAGNOSIS — G9341 Metabolic encephalopathy: Secondary | ICD-10-CM | POA: Diagnosis not present

## 2021-06-16 DIAGNOSIS — G64 Other disorders of peripheral nervous system: Secondary | ICD-10-CM | POA: Diagnosis not present

## 2021-06-16 DIAGNOSIS — Z955 Presence of coronary angioplasty implant and graft: Secondary | ICD-10-CM | POA: Diagnosis not present

## 2021-06-16 DIAGNOSIS — N1831 Chronic kidney disease, stage 3a: Secondary | ICD-10-CM | POA: Diagnosis not present

## 2021-06-16 DIAGNOSIS — Z79891 Long term (current) use of opiate analgesic: Secondary | ICD-10-CM | POA: Diagnosis not present

## 2021-06-16 DIAGNOSIS — I2510 Atherosclerotic heart disease of native coronary artery without angina pectoris: Secondary | ICD-10-CM | POA: Diagnosis not present

## 2021-06-17 DIAGNOSIS — I2510 Atherosclerotic heart disease of native coronary artery without angina pectoris: Secondary | ICD-10-CM | POA: Diagnosis not present

## 2021-06-17 DIAGNOSIS — M1990 Unspecified osteoarthritis, unspecified site: Secondary | ICD-10-CM | POA: Diagnosis not present

## 2021-06-17 DIAGNOSIS — I700 Atherosclerosis of aorta: Secondary | ICD-10-CM | POA: Diagnosis not present

## 2021-06-17 DIAGNOSIS — E871 Hypo-osmolality and hyponatremia: Secondary | ICD-10-CM | POA: Diagnosis not present

## 2021-06-17 DIAGNOSIS — K219 Gastro-esophageal reflux disease without esophagitis: Secondary | ICD-10-CM | POA: Diagnosis not present

## 2021-06-17 DIAGNOSIS — G64 Other disorders of peripheral nervous system: Secondary | ICD-10-CM | POA: Diagnosis not present

## 2021-06-17 DIAGNOSIS — J9621 Acute and chronic respiratory failure with hypoxia: Secondary | ICD-10-CM | POA: Diagnosis not present

## 2021-06-17 DIAGNOSIS — J449 Chronic obstructive pulmonary disease, unspecified: Secondary | ICD-10-CM | POA: Diagnosis not present

## 2021-06-17 DIAGNOSIS — G9341 Metabolic encephalopathy: Secondary | ICD-10-CM | POA: Diagnosis not present

## 2021-06-17 DIAGNOSIS — J9622 Acute and chronic respiratory failure with hypercapnia: Secondary | ICD-10-CM | POA: Diagnosis not present

## 2021-06-17 DIAGNOSIS — Z9981 Dependence on supplemental oxygen: Secondary | ICD-10-CM | POA: Diagnosis not present

## 2021-06-17 DIAGNOSIS — Z79891 Long term (current) use of opiate analgesic: Secondary | ICD-10-CM | POA: Diagnosis not present

## 2021-06-17 DIAGNOSIS — N1831 Chronic kidney disease, stage 3a: Secondary | ICD-10-CM | POA: Diagnosis not present

## 2021-06-17 DIAGNOSIS — Z7951 Long term (current) use of inhaled steroids: Secondary | ICD-10-CM | POA: Diagnosis not present

## 2021-06-17 DIAGNOSIS — Z955 Presence of coronary angioplasty implant and graft: Secondary | ICD-10-CM | POA: Diagnosis not present

## 2021-06-17 DIAGNOSIS — E1122 Type 2 diabetes mellitus with diabetic chronic kidney disease: Secondary | ICD-10-CM | POA: Diagnosis not present

## 2021-06-17 DIAGNOSIS — J441 Chronic obstructive pulmonary disease with (acute) exacerbation: Secondary | ICD-10-CM | POA: Diagnosis not present

## 2021-06-17 DIAGNOSIS — D509 Iron deficiency anemia, unspecified: Secondary | ICD-10-CM | POA: Diagnosis not present

## 2021-06-17 DIAGNOSIS — I129 Hypertensive chronic kidney disease with stage 1 through stage 4 chronic kidney disease, or unspecified chronic kidney disease: Secondary | ICD-10-CM | POA: Diagnosis not present

## 2021-06-17 DIAGNOSIS — Z794 Long term (current) use of insulin: Secondary | ICD-10-CM | POA: Diagnosis not present

## 2021-06-17 DIAGNOSIS — E782 Mixed hyperlipidemia: Secondary | ICD-10-CM | POA: Diagnosis not present

## 2021-06-17 DIAGNOSIS — Z7982 Long term (current) use of aspirin: Secondary | ICD-10-CM | POA: Diagnosis not present

## 2021-06-17 DIAGNOSIS — J9601 Acute respiratory failure with hypoxia: Secondary | ICD-10-CM | POA: Diagnosis not present

## 2021-06-17 DIAGNOSIS — G4700 Insomnia, unspecified: Secondary | ICD-10-CM | POA: Diagnosis not present

## 2021-06-18 DIAGNOSIS — E1122 Type 2 diabetes mellitus with diabetic chronic kidney disease: Secondary | ICD-10-CM | POA: Diagnosis not present

## 2021-06-18 DIAGNOSIS — J9622 Acute and chronic respiratory failure with hypercapnia: Secondary | ICD-10-CM | POA: Diagnosis not present

## 2021-06-18 DIAGNOSIS — E871 Hypo-osmolality and hyponatremia: Secondary | ICD-10-CM | POA: Diagnosis not present

## 2021-06-18 DIAGNOSIS — D509 Iron deficiency anemia, unspecified: Secondary | ICD-10-CM | POA: Diagnosis not present

## 2021-06-18 DIAGNOSIS — Z79891 Long term (current) use of opiate analgesic: Secondary | ICD-10-CM | POA: Diagnosis not present

## 2021-06-18 DIAGNOSIS — Z9981 Dependence on supplemental oxygen: Secondary | ICD-10-CM | POA: Diagnosis not present

## 2021-06-18 DIAGNOSIS — E782 Mixed hyperlipidemia: Secondary | ICD-10-CM | POA: Diagnosis not present

## 2021-06-18 DIAGNOSIS — Z955 Presence of coronary angioplasty implant and graft: Secondary | ICD-10-CM | POA: Diagnosis not present

## 2021-06-18 DIAGNOSIS — G9341 Metabolic encephalopathy: Secondary | ICD-10-CM | POA: Diagnosis not present

## 2021-06-18 DIAGNOSIS — M1990 Unspecified osteoarthritis, unspecified site: Secondary | ICD-10-CM | POA: Diagnosis not present

## 2021-06-18 DIAGNOSIS — K219 Gastro-esophageal reflux disease without esophagitis: Secondary | ICD-10-CM | POA: Diagnosis not present

## 2021-06-18 DIAGNOSIS — Z794 Long term (current) use of insulin: Secondary | ICD-10-CM | POA: Diagnosis not present

## 2021-06-18 DIAGNOSIS — G64 Other disorders of peripheral nervous system: Secondary | ICD-10-CM | POA: Diagnosis not present

## 2021-06-18 DIAGNOSIS — G4700 Insomnia, unspecified: Secondary | ICD-10-CM | POA: Diagnosis not present

## 2021-06-18 DIAGNOSIS — I129 Hypertensive chronic kidney disease with stage 1 through stage 4 chronic kidney disease, or unspecified chronic kidney disease: Secondary | ICD-10-CM | POA: Diagnosis not present

## 2021-06-18 DIAGNOSIS — Z7982 Long term (current) use of aspirin: Secondary | ICD-10-CM | POA: Diagnosis not present

## 2021-06-18 DIAGNOSIS — N1831 Chronic kidney disease, stage 3a: Secondary | ICD-10-CM | POA: Diagnosis not present

## 2021-06-18 DIAGNOSIS — I2510 Atherosclerotic heart disease of native coronary artery without angina pectoris: Secondary | ICD-10-CM | POA: Diagnosis not present

## 2021-06-18 DIAGNOSIS — J9621 Acute and chronic respiratory failure with hypoxia: Secondary | ICD-10-CM | POA: Diagnosis not present

## 2021-06-18 DIAGNOSIS — J441 Chronic obstructive pulmonary disease with (acute) exacerbation: Secondary | ICD-10-CM | POA: Diagnosis not present

## 2021-06-18 DIAGNOSIS — Z7951 Long term (current) use of inhaled steroids: Secondary | ICD-10-CM | POA: Diagnosis not present

## 2021-06-25 ENCOUNTER — Ambulatory Visit: Payer: Medicare Other | Admitting: Orthopaedic Surgery

## 2021-06-26 ENCOUNTER — Institutional Professional Consult (permissible substitution): Payer: Medicare Other | Admitting: Internal Medicine

## 2021-07-01 ENCOUNTER — Ambulatory Visit: Payer: Medicare Other | Admitting: Cardiology

## 2021-07-13 DIAGNOSIS — 419620001 Death: Secondary | SNOMED CT | POA: Diagnosis not present

## 2021-07-13 DEATH — deceased

## 2022-06-10 ENCOUNTER — Encounter (INDEPENDENT_AMBULATORY_CARE_PROVIDER_SITE_OTHER): Payer: Medicare Other | Admitting: Ophthalmology
